# Patient Record
Sex: Male | Born: 1950 | Race: White | Hispanic: No | Marital: Married | Smoking: Former smoker
Health system: Southern US, Community
[De-identification: ages and names within clinical notes are randomized; demographics above are authoritative.]

## PROBLEM LIST (undated history)

## (undated) DIAGNOSIS — E785 Hyperlipidemia, unspecified: Secondary | ICD-10-CM

## (undated) DIAGNOSIS — Z923 Personal history of irradiation: Secondary | ICD-10-CM

## (undated) DIAGNOSIS — K2270 Barrett's esophagus without dysplasia: Secondary | ICD-10-CM

## (undated) DIAGNOSIS — H04123 Dry eye syndrome of bilateral lacrimal glands: Secondary | ICD-10-CM

## (undated) DIAGNOSIS — C8599 Non-Hodgkin lymphoma, unspecified, extranodal and solid organ sites: Secondary | ICD-10-CM

## (undated) DIAGNOSIS — I10 Essential (primary) hypertension: Secondary | ICD-10-CM

## (undated) DIAGNOSIS — N200 Calculus of kidney: Secondary | ICD-10-CM

## (undated) DIAGNOSIS — H3530 Unspecified macular degeneration: Secondary | ICD-10-CM

## (undated) DIAGNOSIS — Z86010 Personal history of colonic polyps: Secondary | ICD-10-CM

## (undated) DIAGNOSIS — K449 Diaphragmatic hernia without obstruction or gangrene: Secondary | ICD-10-CM

## (undated) DIAGNOSIS — Z87442 Personal history of urinary calculi: Secondary | ICD-10-CM

## (undated) DIAGNOSIS — G4733 Obstructive sleep apnea (adult) (pediatric): Secondary | ICD-10-CM

## (undated) DIAGNOSIS — K219 Gastro-esophageal reflux disease without esophagitis: Secondary | ICD-10-CM

## (undated) DIAGNOSIS — Z9989 Dependence on other enabling machines and devices: Secondary | ICD-10-CM

## (undated) HISTORY — PX: ESOPHAGOGASTRODUODENOSCOPY: SHX1529

## (undated) HISTORY — PX: TONSILLECTOMY: SUR1361

## (undated) HISTORY — PX: EXTRACORPOREAL SHOCK WAVE LITHOTRIPSY: SHX1557

## (undated) HISTORY — PX: HYDROCELE EXCISION: SHX482

## (undated) HISTORY — DX: Unspecified macular degeneration: H35.30

## (undated) HISTORY — DX: Essential (primary) hypertension: I10

## (undated) HISTORY — DX: Personal history of irradiation: Z92.3

---

## 2013-01-12 DIAGNOSIS — Z860100 Personal history of colon polyps, unspecified: Secondary | ICD-10-CM

## 2013-01-12 HISTORY — DX: Personal history of colon polyps, unspecified: Z86.0100

## 2013-01-12 HISTORY — DX: Personal history of colonic polyps: Z86.010

## 2014-01-12 HISTORY — PX: COLONOSCOPY WITH ESOPHAGOGASTRODUODENOSCOPY (EGD): SHX5779

## 2015-06-28 DIAGNOSIS — Z23 Encounter for immunization: Secondary | ICD-10-CM | POA: Diagnosis not present

## 2015-11-04 DIAGNOSIS — H353121 Nonexudative age-related macular degeneration, left eye, early dry stage: Secondary | ICD-10-CM | POA: Diagnosis not present

## 2015-11-04 DIAGNOSIS — H01021 Squamous blepharitis right upper eyelid: Secondary | ICD-10-CM | POA: Diagnosis not present

## 2015-11-04 DIAGNOSIS — H01024 Squamous blepharitis left upper eyelid: Secondary | ICD-10-CM | POA: Diagnosis not present

## 2015-11-04 DIAGNOSIS — H04123 Dry eye syndrome of bilateral lacrimal glands: Secondary | ICD-10-CM | POA: Diagnosis not present

## 2015-12-04 DIAGNOSIS — C8590 Non-Hodgkin lymphoma, unspecified, unspecified site: Secondary | ICD-10-CM | POA: Diagnosis not present

## 2015-12-04 DIAGNOSIS — Z1389 Encounter for screening for other disorder: Secondary | ICD-10-CM | POA: Diagnosis not present

## 2015-12-04 DIAGNOSIS — H04129 Dry eye syndrome of unspecified lacrimal gland: Secondary | ICD-10-CM | POA: Diagnosis not present

## 2015-12-04 DIAGNOSIS — M1990 Unspecified osteoarthritis, unspecified site: Secondary | ICD-10-CM | POA: Diagnosis not present

## 2015-12-04 DIAGNOSIS — K219 Gastro-esophageal reflux disease without esophagitis: Secondary | ICD-10-CM | POA: Diagnosis not present

## 2015-12-04 DIAGNOSIS — I10 Essential (primary) hypertension: Secondary | ICD-10-CM | POA: Diagnosis not present

## 2015-12-05 DIAGNOSIS — H01024 Squamous blepharitis left upper eyelid: Secondary | ICD-10-CM | POA: Diagnosis not present

## 2015-12-05 DIAGNOSIS — H353121 Nonexudative age-related macular degeneration, left eye, early dry stage: Secondary | ICD-10-CM | POA: Diagnosis not present

## 2015-12-05 DIAGNOSIS — H01021 Squamous blepharitis right upper eyelid: Secondary | ICD-10-CM | POA: Diagnosis not present

## 2015-12-25 ENCOUNTER — Inpatient Hospital Stay: Payer: Medicare Other | Admitting: Internal Medicine

## 2015-12-31 DIAGNOSIS — B360 Pityriasis versicolor: Secondary | ICD-10-CM | POA: Diagnosis not present

## 2015-12-31 DIAGNOSIS — L821 Other seborrheic keratosis: Secondary | ICD-10-CM | POA: Diagnosis not present

## 2015-12-31 DIAGNOSIS — L812 Freckles: Secondary | ICD-10-CM | POA: Diagnosis not present

## 2015-12-31 DIAGNOSIS — D485 Neoplasm of uncertain behavior of skin: Secondary | ICD-10-CM | POA: Diagnosis not present

## 2016-01-03 ENCOUNTER — Encounter: Payer: Self-pay | Admitting: *Deleted

## 2016-01-03 ENCOUNTER — Inpatient Hospital Stay: Payer: Medicare Other | Attending: Internal Medicine | Admitting: Internal Medicine

## 2016-01-03 ENCOUNTER — Inpatient Hospital Stay: Payer: Medicare Other

## 2016-01-03 VITALS — BP 142/82 | HR 78 | Temp 98.6°F | Resp 20 | Ht 74.34 in | Wt 252.4 lb

## 2016-01-03 DIAGNOSIS — Z7982 Long term (current) use of aspirin: Secondary | ICD-10-CM | POA: Diagnosis not present

## 2016-01-03 DIAGNOSIS — R2231 Localized swelling, mass and lump, right upper limb: Secondary | ICD-10-CM | POA: Insufficient documentation

## 2016-01-03 DIAGNOSIS — Z79899 Other long term (current) drug therapy: Secondary | ICD-10-CM | POA: Diagnosis not present

## 2016-01-03 DIAGNOSIS — Z923 Personal history of irradiation: Secondary | ICD-10-CM | POA: Insufficient documentation

## 2016-01-03 DIAGNOSIS — I10 Essential (primary) hypertension: Secondary | ICD-10-CM | POA: Diagnosis not present

## 2016-01-03 DIAGNOSIS — Z8572 Personal history of non-Hodgkin lymphomas: Secondary | ICD-10-CM | POA: Insufficient documentation

## 2016-01-03 DIAGNOSIS — H3530 Unspecified macular degeneration: Secondary | ICD-10-CM | POA: Diagnosis not present

## 2016-01-03 DIAGNOSIS — Z87891 Personal history of nicotine dependence: Secondary | ICD-10-CM | POA: Diagnosis not present

## 2016-01-03 DIAGNOSIS — C8580 Other specified types of non-Hodgkin lymphoma, unspecified site: Secondary | ICD-10-CM

## 2016-01-03 LAB — CBC WITH DIFFERENTIAL/PLATELET
Basophils Absolute: 0.2 10*3/uL — ABNORMAL HIGH (ref 0–0.1)
Basophils Relative: 4 %
Eosinophils Absolute: 0.2 10*3/uL (ref 0–0.7)
Eosinophils Relative: 4 %
HCT: 49.1 % (ref 40.0–52.0)
Hemoglobin: 16.5 g/dL (ref 13.0–18.0)
Lymphocytes Relative: 22 %
Lymphs Abs: 1.4 10*3/uL (ref 1.0–3.6)
MCH: 28.3 pg (ref 26.0–34.0)
MCHC: 33.5 g/dL (ref 32.0–36.0)
MCV: 84.3 fL (ref 80.0–100.0)
Monocytes Absolute: 0.5 10*3/uL (ref 0.2–1.0)
Monocytes Relative: 7 %
Neutro Abs: 4.1 10*3/uL (ref 1.4–6.5)
Neutrophils Relative %: 63 %
Platelets: 262 10*3/uL (ref 150–440)
RBC: 5.83 MIL/uL (ref 4.40–5.90)
RDW: 13 % (ref 11.5–14.5)
WBC: 6.5 10*3/uL (ref 3.8–10.6)

## 2016-01-03 LAB — COMPREHENSIVE METABOLIC PANEL
ALT: 38 U/L (ref 17–63)
AST: 35 U/L (ref 15–41)
Albumin: 4.5 g/dL (ref 3.5–5.0)
Alkaline Phosphatase: 102 U/L (ref 38–126)
Anion gap: 6 (ref 5–15)
BUN: 16 mg/dL (ref 6–20)
CO2: 29 mmol/L (ref 22–32)
Calcium: 9.8 mg/dL (ref 8.9–10.3)
Chloride: 101 mmol/L (ref 101–111)
Creatinine, Ser: 0.87 mg/dL (ref 0.61–1.24)
GFR calc Af Amer: >60 mL/min (ref >60)
GFR calc non Af Amer: >60 mL/min (ref >60)
Glucose, Bld: 118 mg/dL — ABNORMAL HIGH (ref 65–99)
Potassium: 3.9 mmol/L (ref 3.5–5.1)
Sodium: 136 mmol/L (ref 135–145)
Total Bilirubin: 0.7 mg/dL (ref 0.3–1.2)
Total Protein: 7.6 g/dL (ref 6.5–8.1)

## 2016-01-03 LAB — LACTATE DEHYDROGENASE: LDH: 158 U/L (ref 98–192)

## 2016-01-03 NOTE — Progress Notes (Signed)
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  male with a history of cutaneous lymphoma as listed above; is here to set up oncology appointment/ discuss the treatment options of his forearm lesion.  As summarized above patient had lymphoma of the face and right thigh status post radiation dating more than 10 years ago. He noted to have a lesion of his right forearm in 2012; that was subsequently biopsied in 2015 that was suggestive of a lymphoproliferative disorder.  Patient noted to have increasing size of the right forearm lesion. Otherwise denies any pain. Denies any bleeding. Appetite is good. No weight loss. Denies any night sweats. No fevers.   ROS: A complete 10 point review of system is done which is negative except mentioned above in history of present illness  MEDICAL HISTORY:  Past Medical History  Diagnosis Date  . 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 Hyperplastic colon polyp 01/12/2014  . Barrett esophagus   . Heartburn 01/11/2014  . Hydrocele   . Renal stone   . Hypertension   . Dry eye   . Macular degeneration   . History of radiation therapy     of lymphoma on skin: face and leg    SURGICAL HISTORY: Past Surgical History  Procedure Laterality Date  . Colonoscopy  01/12/2014  . Hydrocele excision    . Kidney stone surgery    . Esophagogastroduodenoscopy  01/12/2014    SOCIAL HISTORY: moved from [place].  Social History   Social History  . Marital Status: Married    Spouse Name: N/A  . Number of Children: N/A  . Years of Education: N/A   Occupational History  .  Not on file.   Social History Main Topics  . Smoking status: Former Smoker -- 2.00 packs/day for 22 years    Types: Cigarettes    Quit date: 08/22/1989  . Smokeless tobacco: Never Used     Comment: smoked x 22 yrs.  . Alcohol Use: 0.0 oz/week    0 Standard drinks or equivalent per week     Comment: glass of wine a day and martini on the weekend.  . Drug Use: No  . Sexual Activity: Not on file   Other Topics Concern  . Not on file   Social History Narrative  . No narrative on file    FAMILY HISTORY: Family History  Problem Relation Age of Onset  . Diabetes Father   . Diabetes Brother   . Heart murmur Mother   . Kidney disease Mother   . Aneurysm Mother     ALLERGIES:  has No Known Allergies.  MEDICATIONS:  Current Outpatient Prescriptions  Medication Sig Dispense Refill  . aspirin 81 MG chewable tablet Chew 81 mg by mouth daily.    . ciclopirox (LOPROX) 0.77 % cream Apply 1 application topically once a week.    . Eyelid Cleansers (AVENOVA) 0.01 % SOLN Apply 1 Dose topically 2 (two) times daily.    . Fish Oil-Cholecalciferol (OMEGA-3 + VITAMIN D3 PO) Take 4 capsules by mouth daily.    . Liniments (BLUE-EMU SUPER STRENGTH) CREA  Apply 1 application topically once.    . Multiple Vitamins-Minerals (PRESERVISION/LUTEIN PO) Take 2 capsules by mouth daily.    . naproxen sodium (ANAPROX) 220 MG tablet Take 220 mg by mouth 2 (two) times daily as needed (pain). Reported on 01/03/2016    . omeprazole (PRILOSEC) 20 MG capsule Take 20 mg by mouth daily.    Vladimir Faster Glycol-Propyl Glycol (SYSTANE OP) Apply 1 drop to eye 3 (three) times daily. Each eye    . selenium sulfide (SELSUN) 2.5 % shampoo Apply 1 application topically daily as needed for irritation.    . valsartan-hydrochlorothiazide (DIOVAN-HCT) 160-12.5 MG tablet Take 1 tablet by mouth daily.     No current facility-administered medications for this visit.      Marland Kitchen  PHYSICAL EXAMINATION: ECOG PERFORMANCE STATUS: 0 -  Asymptomatic  Filed Vitals:   01/03/16 1527 01/03/16 1536  BP:  142/82  Pulse:  78  Temp:  98.6 F (37 C)  Resp: 20 20   Filed Weights   01/03/16 1527  Weight: 252 lb 6.8 oz (114.5 kg)    GENERAL: Well-nourished well-developed; Alert, no distress and comfortable.  Accompanied by his wife.  EYES: no pallor or icterus OROPHARYNX: no thrush or ulceration; good dentition  NECK: supple, no masses felt LYMPH:  no palpable lymphadenopathy in the cervical, axillary or inguinal regions LUNGS: clear to auscultation and  No wheeze or crackles HEART/CVS: regular rate & rhythm and no murmurs; No lower extremity edema ABDOMEN: abdomen soft, non-tender and normal bowel sounds Musculoskeletal:no cyanosis of digits and no clubbing  PSYCH: alert & oriented x 3 with fluent speech NEURO: no focal motor/sensory deficits SKIN: 1 x 1.5 cm nodular lesion noted on the right distal forearm.   LABORATORY DATA:  I have reviewed the data as listed Lab Results  Component Value Date   WBC 6.5 01/03/2016   HGB 16.5 01/03/2016   HCT 49.1 01/03/2016   MCV 84.3 01/03/2016   PLT 262 01/03/2016    Recent Labs  01/03/16 1516  NA 136  K 3.9  CL 101  CO2 29  GLUCOSE 118*  BUN 16  CREATININE 0.87  CALCIUM 9.8  GFRNONAA >60  GFRAA >60  PROT 7.6  ALBUMIN 4.5  AST 35  ALT 38  ALKPHOS 102  BILITOT 0.7     ASSESSMENT & PLAN:   # Right forearm nodular lesion- 1x1.5cm- status post previous biopsy 2015 suggestive of lymphoproliferative disorder. I would recommend repeat biopsy for further delineation of the pathology. If it is again confirmed as lymphoma; as this appears to be recurrent cutaneous; # Labs from today are within normal limits- I would not recommend any staging workup at this time. I discussed at length the natural history of cutaneous lymphomas/and the treatment options. As the right arm lesion seems to be growing- I would recommend radiation.   # Patient's wife will call dermatology  for setting up in a biopsy. They'll follow-up with me/Dr. Donella Stade radiation oncology around the same day/approximately 2 weeks after the skin biopsy.  Thank you for allowing me to participate in the care of your pleasant patient. Please do not hesitate to contact me with questions or concerns in the interim.   # 45 minutes face-to-face with the patient discussing the above plan of care; more than 50% of time spent on prognosis/ natural history; counseling and coordination.     Cammie Sickle, MD 01/03/2016 4:06 PM

## 2016-01-06 DIAGNOSIS — C884 Extranodal marginal zone B-cell lymphoma of mucosa-associated lymphoid tissue [MALT-lymphoma]: Secondary | ICD-10-CM | POA: Diagnosis not present

## 2016-01-06 DIAGNOSIS — D485 Neoplasm of uncertain behavior of skin: Secondary | ICD-10-CM | POA: Diagnosis not present

## 2016-01-29 ENCOUNTER — Encounter: Payer: Self-pay | Admitting: Radiation Oncology

## 2016-01-29 ENCOUNTER — Ambulatory Visit
Admission: RE | Admit: 2016-01-29 | Discharge: 2016-01-29 | Disposition: A | Discharge status: Home / Self Care | Payer: Medicare Other | Source: Ambulatory Visit | Attending: Radiation Oncology | Admitting: Radiation Oncology

## 2016-01-29 ENCOUNTER — Inpatient Hospital Stay: Payer: Medicare Other | Attending: Internal Medicine | Admitting: Internal Medicine

## 2016-01-29 VITALS — BP 126/80 | HR 54 | Temp 96.4°F | Resp 16 | Wt 254.4 lb

## 2016-01-29 DIAGNOSIS — C8514 Unspecified B-cell lymphoma, lymph nodes of axilla and upper limb: Secondary | ICD-10-CM | POA: Insufficient documentation

## 2016-01-29 DIAGNOSIS — C884 Extranodal marginal zone B-cell lymphoma of mucosa-associated lymphoid tissue [MALT-lymphoma]: Secondary | ICD-10-CM

## 2016-01-29 DIAGNOSIS — C8580 Other specified types of non-Hodgkin lymphoma, unspecified site: Secondary | ICD-10-CM

## 2016-01-29 DIAGNOSIS — Z8572 Personal history of non-Hodgkin lymphomas: Secondary | ICD-10-CM | POA: Diagnosis not present

## 2016-01-29 DIAGNOSIS — I10 Essential (primary) hypertension: Secondary | ICD-10-CM | POA: Insufficient documentation

## 2016-01-29 DIAGNOSIS — H3530 Unspecified macular degeneration: Secondary | ICD-10-CM | POA: Diagnosis not present

## 2016-01-29 DIAGNOSIS — Z79899 Other long term (current) drug therapy: Secondary | ICD-10-CM | POA: Diagnosis not present

## 2016-01-29 DIAGNOSIS — R12 Heartburn: Secondary | ICD-10-CM | POA: Insufficient documentation

## 2016-01-29 DIAGNOSIS — Z87891 Personal history of nicotine dependence: Secondary | ICD-10-CM | POA: Insufficient documentation

## 2016-01-29 DIAGNOSIS — Z86010 Personal history of colonic polyps: Secondary | ICD-10-CM | POA: Insufficient documentation

## 2016-01-29 DIAGNOSIS — Z7982 Long term (current) use of aspirin: Secondary | ICD-10-CM | POA: Diagnosis not present

## 2016-01-29 DIAGNOSIS — K2270 Barrett's esophagus without dysplasia: Secondary | ICD-10-CM | POA: Insufficient documentation

## 2016-01-29 DIAGNOSIS — Z923 Personal history of irradiation: Secondary | ICD-10-CM | POA: Insufficient documentation

## 2016-01-29 DIAGNOSIS — Z510 Encounter for antineoplastic radiation therapy: Secondary | ICD-10-CM | POA: Insufficient documentation

## 2016-01-29 DIAGNOSIS — C8264 Cutaneous follicle center lymphoma, lymph nodes of axilla and upper limb: Secondary | ICD-10-CM | POA: Insufficient documentation

## 2016-01-29 DIAGNOSIS — Z87442 Personal history of urinary calculi: Secondary | ICD-10-CM | POA: Insufficient documentation

## 2016-01-29 NOTE — Progress Notes (Signed)
Patient has been evaluated and had a biopsy by dermatology, Dr. Allyson Sabal.

## 2016-01-29 NOTE — Consult Note (Signed)
Except an outstanding is perfect of Radiation Oncology NEW PATIENT EVALUATION  Name: Michael Huffman  MRN: [id number]  Date:   01/29/2016     DOB: 12/15/1950   This 65 y.o. 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  REFERRING PHYSICIAN: Wenda Low, MD  CHIEF COMPLAINT: No chief complaint on file.   DIAGNOSIS: The encounter diagnosis was Other specified type of non-Hodgkin lymphoma ([place]).   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 Recently presented with a right arm nodular density initial biopsy was positive for lymphocytic infiltrate back in May 2015. He had a shave biopsy showing cutaneous follicular B-cell lymphoma according to oral report from medical oncology. I've requested those reports for my records. He is otherwise doing well he specifically denies fever chills night sweats or weight loss. I've asked to evaluate the patient for radiation therapy to this area.  PLANNED TREATMENT REGIMEN: Electron beam therapy  PAST MEDICAL HISTORY:  has a past medical history of Non Hodgkin's lymphoma ([place]) (2006); Hyperplastic colon polyp (01/12/2014); Barrett esophagus; Heartburn (01/11/2014); Hydrocele; Renal stone; Hypertension; Dry eye; Macular degeneration; and History of radiation therapy.    PAST SURGICAL HISTORY:  Past Surgical History  Procedure Laterality Date  . Colonoscopy  01/12/2014  . Hydrocele excision    . Kidney stone surgery    . Esophagogastroduodenoscopy  01/12/2014    FAMILY HISTORY: family history includes Aneurysm in his mother; Diabetes in his brother and father; Heart murmur in his mother; Kidney disease in his  mother.  SOCIAL HISTORY:  reports that he quit smoking about 26 years ago. His smoking use included Cigarettes. He has a 44 pack-year smoking history. He has never used smokeless tobacco. He reports that he drinks alcohol. He reports that he does not use illicit drugs.  ALLERGIES: Review of patient's allergies indicates no known allergies.  MEDICATIONS:  Current Outpatient Prescriptions  Medication Sig Dispense Refill  . aspirin 81 MG chewable tablet Chew 81 mg by mouth daily.    . ciclopirox (LOPROX) 0.77 % cream Apply 1 application topically once a week.    . Eyelid Cleansers (AVENOVA) 0.01 % SOLN Apply 1 Dose topically 2 (two) times daily.    . Fish Oil-Cholecalciferol (OMEGA-3 + VITAMIN D3 PO) Take 4 capsules by mouth daily.    . Liniments (BLUE-EMU SUPER STRENGTH) CREA Apply 1 application topically once.    . Multiple Vitamins-Minerals (PRESERVISION/LUTEIN PO) Take 2 capsules by mouth daily.    . naproxen sodium (ANAPROX) 220 MG tablet Take 220 mg by mouth 2 (two) times daily as needed (pain). Reported on 01/03/2016    . omeprazole (PRILOSEC) 20 MG capsule Take 20 mg by mouth daily.    Vladimir Faster Glycol-Propyl Glycol (SYSTANE OP) Apply 1 drop to eye 3 (three) times daily. Each eye    . selenium sulfide (SELSUN) 2.5 % shampoo Apply 1 application topically daily as needed for irritation.    . valsartan-hydrochlorothiazide (DIOVAN-HCT) 160-12.5 MG tablet Take 1 tablet by mouth daily.     No current facility-administered medications for this encounter.    ECOG PERFORMANCE STATUS:  0 - Asymptomatic  REVIEW OF SYSTEMS:  Patient denies any weight loss, fatigue, weakness, fever,  chills or night sweats. Patient denies any loss of vision, blurred vision. Patient denies any ringing  of the ears or hearing loss. No irregular heartbeat. Patient denies heart murmur or history of fainting. Patient denies any chest pain or pain radiating to her upper extremities. Patient denies any shortness of  breath, difficulty breathing at night, cough or hemoptysis. Patient denies any swelling in the lower legs. Patient denies any nausea vomiting, vomiting of blood, or coffee ground material in the vomitus. Patient denies any stomach pain. Patient states has had normal bowel movements no significant constipation or diarrhea. Patient denies any dysuria, hematuria or significant nocturia. Patient denies any problems walking, swelling in the joints or loss of balance. Patient denies any skin changes, loss of hair or loss of weight. Patient denies any excessive worrying or anxiety or significant depression. Patient denies any problems with insomnia. Patient denies excessive thirst, polyuria, polydipsia. Patient denies any swollen glands, patient denies easy bruising or easy bleeding. Patient denies any recent infections, allergies or URI. Patient "s visual fields have not changed significantly in recent time.    PHYSICAL EXAM: There were no vitals taken for this visit. Approximate 1 cm raised erythematous nodule on the right forearm consistent with known diagnosis. No evidence of axillary supra clavicular adenopathy on the right side is noted. No other evidence of adenopathy is appreciated. Well-developed well-nourished patient in NAD. HEENT reveals PERLA, EOMI, discs not visualized.  Oral cavity is clear. No oral mucosal lesions are identified. Neck is clear without evidence of cervical or supraclavicular adenopathy. Lungs are clear to A&P. Cardiac examination is essentially unremarkable with regular rate and rhythm without murmur rub or thrill. Abdomen is benign with no organomegaly or masses noted. Motor sensory and DTR levels are equal and symmetric in the upper and lower extremities. Cranial nerves II through XII are grossly intact. Proprioception is intact. No peripheral adenopathy or edema is identified. No motor or sensory levels are noted. Crude visual fields are within normal range.  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 Would also plan on delivering 4000 centigray over 4 weeks using electron beam to this area. The standard dose in the literature runs between 3000 and 4000 cGy. Risks and benefits of treatment including skin reaction loss of hair on the part of his arm possible slight fatigue all were discussed in detail with the patient and his wife. Both seem to comprehend my treatment plan well. I firstly ordered CT simulation on the patient for depth dose determination.  I would like to take this opportunity for allowing me to participate in the care of your patient.Armstead Peaks., MD

## 2016-01-29 NOTE — Progress Notes (Signed)
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 [Dr.Lupton; [place]] RIGHT FOREARM s/p punch Biopsy; Recm RT  HISTORY OF PRESENTING ILLNESS:  Michael Huffman 65 y.o.  male with a history of cutaneous lymphoma as listed above- had a recent nodular lesion taken off his right forearm at [facility] later part of April 2017. As per the patient it was a cutaneous B-cell lymphoma.  He denies any new lesions. Otherwise denies any pain. Denies any bleeding. Appetite is good. No weight loss. Denies any night sweats. No fevers.   ROS: A complete 10 point review of system is done which is negative except mentioned above in history of present illness  MEDICAL HISTORY:  Past Medical History  Diagnosis Date  . 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 Hyperplastic colon polyp 01/12/2014  . Barrett esophagus   . Heartburn 01/11/2014  . Hydrocele   . Renal stone   . Hypertension   . Dry eye   . Macular degeneration   . History of radiation therapy     of lymphoma on skin: face and leg    SURGICAL HISTORY: Past Surgical History  Procedure Laterality Date  . Colonoscopy  01/12/2014  . Hydrocele excision    . Kidney stone surgery    . Esophagogastroduodenoscopy  01/12/2014    SOCIAL HISTORY: moved from [place].  Social History   Social History  . Marital Status: Married    Spouse Name: N/A  . Number of Children: N/A  . Years of Education: N/A   Occupational History  . Not on file.   Social History Main Topics  . Smoking status: Former Smoker --  2.00 packs/day for 22 years    Types: Cigarettes    Quit date: 08/22/1989  . Smokeless tobacco: Never Used     Comment: smoked x 22 yrs.  . Alcohol Use: 0.0 oz/week    0 Standard drinks or equivalent per week     Comment: glass of wine a day and martini on the weekend.  . Drug Use: No  . Sexual Activity: Not on file   Other Topics Concern  . Not on file   Social History Narrative  . No narrative on file    FAMILY HISTORY: Family History  Problem Relation Age of Onset  . Diabetes Father   . Diabetes Brother   . Heart murmur Mother   . Kidney disease Mother   . Aneurysm Mother     ALLERGIES:  has No Known Allergies.  MEDICATIONS:  Current Outpatient Prescriptions  Medication Sig Dispense Refill  . aspirin 81 MG chewable tablet Chew 81 mg by mouth daily.    . ciclopirox (LOPROX) 0.77 % cream Apply 1 application topically once a week.    . Eyelid Cleansers (AVENOVA) 0.01 % SOLN Apply 1 Dose topically 2 (two) times daily.    . Fish Oil-Cholecalciferol (OMEGA-3 + VITAMIN D3 PO) Take 4 capsules by mouth daily.    . Liniments (BLUE-EMU SUPER STRENGTH) CREA Apply 1 application topically once.    . Multiple Vitamins-Minerals (PRESERVISION/LUTEIN PO) Take 2  capsules by mouth daily.    . naproxen sodium (ANAPROX) 220 MG tablet Take 220 mg by mouth 2 (two) times daily as needed (pain). Reported on 01/03/2016    . omeprazole (PRILOSEC) 20 MG capsule Take 20 mg by mouth daily.    Michael Huffman Glycol-Propyl Glycol (SYSTANE OP) Apply 1 drop to eye 3 (three) times daily. Each eye    . selenium sulfide (SELSUN) 2.5 % shampoo Apply 1 application topically daily as needed for irritation.    . valsartan-hydrochlorothiazide (DIOVAN-HCT) 160-12.5 MG tablet Take 1 tablet by mouth daily.     No current facility-administered medications for this visit.      Marland Kitchen  PHYSICAL EXAMINATION: ECOG PERFORMANCE STATUS: 0 - Asymptomatic  Filed Vitals:   01/29/16 0952  BP: 126/80  Pulse: 54  Temp: 96.4  F (35.8 C)  Resp: 16   Filed Weights   01/29/16 0952  Weight: 254 lb 6.6 oz (115.4 kg)    GENERAL: Well-nourished well-developed; Alert, no distress and comfortable.  Accompanied by his wife.  EYES: no pallor or icterus OROPHARYNX: no thrush or ulceration; good dentition  NECK: supple, no masses felt LYMPH:  no palpable lymphadenopathy in the cervical, axillary or inguinal regions LUNGS: clear to auscultation and  No wheeze or crackles HEART/CVS: regular rate & rhythm and no murmurs; No lower extremity edema ABDOMEN: abdomen soft, non-tender and normal bowel sounds Musculoskeletal:no cyanosis of digits and no clubbing  PSYCH: alert & oriented x 3 with fluent speech NEURO: no focal motor/sensory deficits SKIN: 0.5x0.5cm nodular lesion noted on the right distal forearm [site of excision]  LABORATORY DATA:  I have reviewed the data as listed Lab Results  Component Value Date   WBC 6.5 01/03/2016   HGB 16.5 01/03/2016   HCT 49.1 01/03/2016   MCV 84.3 01/03/2016   PLT 262 01/03/2016    Recent Labs  01/03/16 1516  NA 136  K 3.9  CL 101  CO2 29  GLUCOSE 118*  BUN 16  CREATININE 0.87  CALCIUM 9.8  GFRNONAA >60  GFRAA >60  PROT 7.6  ALBUMIN 4.5  AST 35  ALT 38  ALKPHOS 102  BILITOT 0.7     ASSESSMENT & PLAN:   # Right forearm nodular lesion-Status post punch Biopsy- late April 2017 [Dr.lupton; [place]]- CUTANEOUS MARGINAL ZONE B CELL LYMPHOMA. Again reviewed with the patient at length that this is usually cutaneous recurrence; and not a systemic recurrence. He will inform us if he has any unusual weight loss or night sweats or new lumps or bumps or fevers.  # Patient will benefit from radiation post excision of the right forearm lesion- as it has been recently growing. Spoke to Dr. Donella Stade. Patient sees Dr. Donella Stade later this morning.  # Patient follow-up with me in approximately 4 months no labs. Above plan discussed with the patient and wife. They'll call us sooner  if needed.   Cammie Sickle, MD 01/29/2016 10:14 AM

## 2016-01-30 ENCOUNTER — Ambulatory Visit
Admission: RE | Admit: 2016-01-30 | Discharge: 2016-01-30 | Disposition: A | Discharge status: Home / Self Care | Payer: Medicare Other | Source: Ambulatory Visit | Attending: Radiation Oncology | Admitting: Radiation Oncology

## 2016-01-30 DIAGNOSIS — Z7982 Long term (current) use of aspirin: Secondary | ICD-10-CM | POA: Diagnosis not present

## 2016-01-30 DIAGNOSIS — I10 Essential (primary) hypertension: Secondary | ICD-10-CM | POA: Diagnosis not present

## 2016-01-30 DIAGNOSIS — Z87891 Personal history of nicotine dependence: Secondary | ICD-10-CM | POA: Diagnosis not present

## 2016-01-30 DIAGNOSIS — C8580 Other specified types of non-Hodgkin lymphoma, unspecified site: Secondary | ICD-10-CM | POA: Diagnosis not present

## 2016-01-30 DIAGNOSIS — Z923 Personal history of irradiation: Secondary | ICD-10-CM | POA: Diagnosis not present

## 2016-01-30 DIAGNOSIS — K2270 Barrett's esophagus without dysplasia: Secondary | ICD-10-CM | POA: Diagnosis not present

## 2016-01-30 DIAGNOSIS — Z510 Encounter for antineoplastic radiation therapy: Secondary | ICD-10-CM | POA: Diagnosis not present

## 2016-01-30 DIAGNOSIS — Z87442 Personal history of urinary calculi: Secondary | ICD-10-CM | POA: Diagnosis not present

## 2016-01-30 DIAGNOSIS — R12 Heartburn: Secondary | ICD-10-CM | POA: Diagnosis not present

## 2016-01-30 DIAGNOSIS — Z86010 Personal history of colonic polyps: Secondary | ICD-10-CM | POA: Diagnosis not present

## 2016-01-30 DIAGNOSIS — C8264 Cutaneous follicle center lymphoma, lymph nodes of axilla and upper limb: Secondary | ICD-10-CM | POA: Diagnosis not present

## 2016-01-30 DIAGNOSIS — Z79899 Other long term (current) drug therapy: Secondary | ICD-10-CM | POA: Diagnosis not present

## 2016-02-03 DIAGNOSIS — I10 Essential (primary) hypertension: Secondary | ICD-10-CM | POA: Diagnosis not present

## 2016-02-03 DIAGNOSIS — Z86010 Personal history of colonic polyps: Secondary | ICD-10-CM | POA: Diagnosis not present

## 2016-02-03 DIAGNOSIS — C8264 Cutaneous follicle center lymphoma, lymph nodes of axilla and upper limb: Secondary | ICD-10-CM | POA: Diagnosis not present

## 2016-02-03 DIAGNOSIS — C8580 Other specified types of non-Hodgkin lymphoma, unspecified site: Secondary | ICD-10-CM | POA: Diagnosis not present

## 2016-02-03 DIAGNOSIS — Z510 Encounter for antineoplastic radiation therapy: Secondary | ICD-10-CM | POA: Diagnosis not present

## 2016-02-03 DIAGNOSIS — K2270 Barrett's esophagus without dysplasia: Secondary | ICD-10-CM | POA: Diagnosis not present

## 2016-02-03 DIAGNOSIS — R12 Heartburn: Secondary | ICD-10-CM | POA: Diagnosis not present

## 2016-02-06 ENCOUNTER — Other Ambulatory Visit: Payer: Self-pay | Admitting: Internal Medicine

## 2016-02-06 ENCOUNTER — Ambulatory Visit
Admission: RE | Admit: 2016-02-06 | Discharge: 2016-02-06 | Disposition: A | Discharge status: Home / Self Care | Payer: Medicare Other | Source: Ambulatory Visit | Attending: Radiation Oncology | Admitting: Radiation Oncology

## 2016-02-06 DIAGNOSIS — Z136 Encounter for screening for cardiovascular disorders: Secondary | ICD-10-CM

## 2016-02-06 DIAGNOSIS — C8264 Cutaneous follicle center lymphoma, lymph nodes of axilla and upper limb: Secondary | ICD-10-CM | POA: Diagnosis not present

## 2016-02-06 DIAGNOSIS — Z23 Encounter for immunization: Secondary | ICD-10-CM | POA: Diagnosis not present

## 2016-02-06 DIAGNOSIS — H04129 Dry eye syndrome of unspecified lacrimal gland: Secondary | ICD-10-CM | POA: Diagnosis not present

## 2016-02-06 DIAGNOSIS — Z0000 Encounter for general adult medical examination without abnormal findings: Secondary | ICD-10-CM | POA: Diagnosis not present

## 2016-02-06 DIAGNOSIS — Z1389 Encounter for screening for other disorder: Secondary | ICD-10-CM | POA: Diagnosis not present

## 2016-02-06 DIAGNOSIS — Z86010 Personal history of colonic polyps: Secondary | ICD-10-CM | POA: Diagnosis not present

## 2016-02-06 DIAGNOSIS — M1990 Unspecified osteoarthritis, unspecified site: Secondary | ICD-10-CM | POA: Diagnosis not present

## 2016-02-06 DIAGNOSIS — R0683 Snoring: Secondary | ICD-10-CM | POA: Diagnosis not present

## 2016-02-06 DIAGNOSIS — Z510 Encounter for antineoplastic radiation therapy: Secondary | ICD-10-CM | POA: Diagnosis not present

## 2016-02-06 DIAGNOSIS — R7309 Other abnormal glucose: Secondary | ICD-10-CM | POA: Diagnosis not present

## 2016-02-06 DIAGNOSIS — R0681 Apnea, not elsewhere classified: Secondary | ICD-10-CM | POA: Diagnosis not present

## 2016-02-06 DIAGNOSIS — G4730 Sleep apnea, unspecified: Secondary | ICD-10-CM | POA: Diagnosis not present

## 2016-02-06 DIAGNOSIS — I10 Essential (primary) hypertension: Secondary | ICD-10-CM | POA: Diagnosis not present

## 2016-02-06 DIAGNOSIS — K2270 Barrett's esophagus without dysplasia: Secondary | ICD-10-CM | POA: Diagnosis not present

## 2016-02-06 DIAGNOSIS — C8590 Non-Hodgkin lymphoma, unspecified, unspecified site: Secondary | ICD-10-CM | POA: Diagnosis not present

## 2016-02-06 DIAGNOSIS — R12 Heartburn: Secondary | ICD-10-CM | POA: Diagnosis not present

## 2016-02-06 DIAGNOSIS — Z6832 Body mass index (BMI) 32.0-32.9, adult: Secondary | ICD-10-CM | POA: Diagnosis not present

## 2016-02-06 DIAGNOSIS — K219 Gastro-esophageal reflux disease without esophagitis: Secondary | ICD-10-CM | POA: Diagnosis not present

## 2016-02-06 DIAGNOSIS — Z125 Encounter for screening for malignant neoplasm of prostate: Secondary | ICD-10-CM | POA: Diagnosis not present

## 2016-02-06 DIAGNOSIS — R739 Hyperglycemia, unspecified: Secondary | ICD-10-CM | POA: Diagnosis not present

## 2016-02-07 ENCOUNTER — Ambulatory Visit
Admission: RE | Admit: 2016-02-07 | Discharge: 2016-02-07 | Disposition: A | Discharge status: Home / Self Care | Payer: Medicare Other | Source: Ambulatory Visit | Attending: Radiation Oncology | Admitting: Radiation Oncology

## 2016-02-07 DIAGNOSIS — Z510 Encounter for antineoplastic radiation therapy: Secondary | ICD-10-CM | POA: Diagnosis not present

## 2016-02-07 DIAGNOSIS — R12 Heartburn: Secondary | ICD-10-CM | POA: Diagnosis not present

## 2016-02-07 DIAGNOSIS — K2270 Barrett's esophagus without dysplasia: Secondary | ICD-10-CM | POA: Diagnosis not present

## 2016-02-07 DIAGNOSIS — Z86010 Personal history of colonic polyps: Secondary | ICD-10-CM | POA: Diagnosis not present

## 2016-02-07 DIAGNOSIS — I10 Essential (primary) hypertension: Secondary | ICD-10-CM | POA: Diagnosis not present

## 2016-02-07 DIAGNOSIS — C8264 Cutaneous follicle center lymphoma, lymph nodes of axilla and upper limb: Secondary | ICD-10-CM | POA: Diagnosis not present

## 2016-02-10 ENCOUNTER — Ambulatory Visit
Admission: RE | Admit: 2016-02-10 | Discharge: 2016-02-10 | Disposition: A | Discharge status: Home / Self Care | Payer: Medicare Other | Source: Ambulatory Visit | Attending: Radiation Oncology | Admitting: Radiation Oncology

## 2016-02-10 DIAGNOSIS — Z86010 Personal history of colonic polyps: Secondary | ICD-10-CM | POA: Diagnosis not present

## 2016-02-10 DIAGNOSIS — I10 Essential (primary) hypertension: Secondary | ICD-10-CM | POA: Diagnosis not present

## 2016-02-10 DIAGNOSIS — R12 Heartburn: Secondary | ICD-10-CM | POA: Diagnosis not present

## 2016-02-10 DIAGNOSIS — Z510 Encounter for antineoplastic radiation therapy: Secondary | ICD-10-CM | POA: Diagnosis not present

## 2016-02-10 DIAGNOSIS — K2270 Barrett's esophagus without dysplasia: Secondary | ICD-10-CM | POA: Diagnosis not present

## 2016-02-10 DIAGNOSIS — C8264 Cutaneous follicle center lymphoma, lymph nodes of axilla and upper limb: Secondary | ICD-10-CM | POA: Diagnosis not present

## 2016-02-11 ENCOUNTER — Ambulatory Visit
Admission: RE | Admit: 2016-02-11 | Discharge: 2016-02-11 | Disposition: A | Discharge status: Home / Self Care | Payer: Medicare Other | Source: Ambulatory Visit | Attending: Radiation Oncology | Admitting: Radiation Oncology

## 2016-02-11 ENCOUNTER — Other Ambulatory Visit: Payer: Self-pay | Admitting: *Deleted

## 2016-02-11 DIAGNOSIS — C884 Extranodal marginal zone B-cell lymphoma of mucosa-associated lymphoid tissue [MALT-lymphoma]: Secondary | ICD-10-CM

## 2016-02-11 DIAGNOSIS — C8264 Cutaneous follicle center lymphoma, lymph nodes of axilla and upper limb: Secondary | ICD-10-CM | POA: Diagnosis not present

## 2016-02-11 DIAGNOSIS — K2270 Barrett's esophagus without dysplasia: Secondary | ICD-10-CM | POA: Diagnosis not present

## 2016-02-11 DIAGNOSIS — Z510 Encounter for antineoplastic radiation therapy: Secondary | ICD-10-CM | POA: Diagnosis not present

## 2016-02-11 DIAGNOSIS — Z86010 Personal history of colonic polyps: Secondary | ICD-10-CM | POA: Diagnosis not present

## 2016-02-11 DIAGNOSIS — R12 Heartburn: Secondary | ICD-10-CM | POA: Diagnosis not present

## 2016-02-11 DIAGNOSIS — I10 Essential (primary) hypertension: Secondary | ICD-10-CM | POA: Diagnosis not present

## 2016-02-12 ENCOUNTER — Ambulatory Visit
Admission: RE | Admit: 2016-02-12 | Discharge: 2016-02-12 | Disposition: A | Discharge status: Home / Self Care | Payer: Medicare Other | Source: Ambulatory Visit | Attending: Radiation Oncology | Admitting: Radiation Oncology

## 2016-02-12 DIAGNOSIS — K2270 Barrett's esophagus without dysplasia: Secondary | ICD-10-CM | POA: Diagnosis not present

## 2016-02-12 DIAGNOSIS — I10 Essential (primary) hypertension: Secondary | ICD-10-CM | POA: Diagnosis not present

## 2016-02-12 DIAGNOSIS — C8580 Other specified types of non-Hodgkin lymphoma, unspecified site: Secondary | ICD-10-CM | POA: Diagnosis not present

## 2016-02-12 DIAGNOSIS — R12 Heartburn: Secondary | ICD-10-CM | POA: Diagnosis not present

## 2016-02-12 DIAGNOSIS — Z510 Encounter for antineoplastic radiation therapy: Secondary | ICD-10-CM | POA: Diagnosis not present

## 2016-02-12 DIAGNOSIS — Z86010 Personal history of colonic polyps: Secondary | ICD-10-CM | POA: Diagnosis not present

## 2016-02-12 DIAGNOSIS — C8264 Cutaneous follicle center lymphoma, lymph nodes of axilla and upper limb: Secondary | ICD-10-CM | POA: Diagnosis not present

## 2016-02-13 ENCOUNTER — Ambulatory Visit
Admission: RE | Admit: 2016-02-13 | Discharge: 2016-02-13 | Disposition: A | Discharge status: Home / Self Care | Payer: Medicare Other | Source: Ambulatory Visit | Attending: Radiation Oncology | Admitting: Radiation Oncology

## 2016-02-13 DIAGNOSIS — Z510 Encounter for antineoplastic radiation therapy: Secondary | ICD-10-CM | POA: Diagnosis not present

## 2016-02-13 DIAGNOSIS — R12 Heartburn: Secondary | ICD-10-CM | POA: Diagnosis not present

## 2016-02-13 DIAGNOSIS — Z86010 Personal history of colonic polyps: Secondary | ICD-10-CM | POA: Diagnosis not present

## 2016-02-13 DIAGNOSIS — K2270 Barrett's esophagus without dysplasia: Secondary | ICD-10-CM | POA: Diagnosis not present

## 2016-02-13 DIAGNOSIS — I10 Essential (primary) hypertension: Secondary | ICD-10-CM | POA: Diagnosis not present

## 2016-02-13 DIAGNOSIS — C8264 Cutaneous follicle center lymphoma, lymph nodes of axilla and upper limb: Secondary | ICD-10-CM | POA: Diagnosis not present

## 2016-02-14 ENCOUNTER — Ambulatory Visit
Admission: RE | Admit: 2016-02-14 | Discharge: 2016-02-14 | Disposition: A | Discharge status: Home / Self Care | Payer: Medicare Other | Source: Ambulatory Visit | Attending: Radiation Oncology | Admitting: Radiation Oncology

## 2016-02-14 DIAGNOSIS — K2270 Barrett's esophagus without dysplasia: Secondary | ICD-10-CM | POA: Diagnosis not present

## 2016-02-14 DIAGNOSIS — Z510 Encounter for antineoplastic radiation therapy: Secondary | ICD-10-CM | POA: Diagnosis not present

## 2016-02-14 DIAGNOSIS — Z86010 Personal history of colonic polyps: Secondary | ICD-10-CM | POA: Diagnosis not present

## 2016-02-14 DIAGNOSIS — I10 Essential (primary) hypertension: Secondary | ICD-10-CM | POA: Diagnosis not present

## 2016-02-14 DIAGNOSIS — C8264 Cutaneous follicle center lymphoma, lymph nodes of axilla and upper limb: Secondary | ICD-10-CM | POA: Diagnosis not present

## 2016-02-14 DIAGNOSIS — R12 Heartburn: Secondary | ICD-10-CM | POA: Diagnosis not present

## 2016-02-18 ENCOUNTER — Ambulatory Visit
Admission: RE | Admit: 2016-02-18 | Discharge: 2016-02-18 | Disposition: A | Discharge status: Home / Self Care | Payer: Medicare Other | Source: Ambulatory Visit | Attending: Radiation Oncology | Admitting: Radiation Oncology

## 2016-02-18 ENCOUNTER — Ambulatory Visit
Admission: RE | Admit: 2016-02-18 | Discharge: 2016-02-18 | Disposition: A | Discharge status: Home / Self Care | Payer: Medicare Other | Source: Ambulatory Visit | Attending: Internal Medicine | Admitting: Internal Medicine

## 2016-02-18 DIAGNOSIS — Z136 Encounter for screening for cardiovascular disorders: Secondary | ICD-10-CM | POA: Diagnosis not present

## 2016-02-18 DIAGNOSIS — K2270 Barrett's esophagus without dysplasia: Secondary | ICD-10-CM | POA: Diagnosis not present

## 2016-02-18 DIAGNOSIS — Z86010 Personal history of colonic polyps: Secondary | ICD-10-CM | POA: Diagnosis not present

## 2016-02-18 DIAGNOSIS — I10 Essential (primary) hypertension: Secondary | ICD-10-CM | POA: Diagnosis not present

## 2016-02-18 DIAGNOSIS — R12 Heartburn: Secondary | ICD-10-CM | POA: Diagnosis not present

## 2016-02-18 DIAGNOSIS — Z510 Encounter for antineoplastic radiation therapy: Secondary | ICD-10-CM | POA: Diagnosis not present

## 2016-02-18 DIAGNOSIS — Z87891 Personal history of nicotine dependence: Secondary | ICD-10-CM | POA: Diagnosis not present

## 2016-02-18 DIAGNOSIS — C8264 Cutaneous follicle center lymphoma, lymph nodes of axilla and upper limb: Secondary | ICD-10-CM | POA: Diagnosis not present

## 2016-02-19 ENCOUNTER — Ambulatory Visit
Admission: RE | Admit: 2016-02-19 | Discharge: 2016-02-19 | Disposition: A | Discharge status: Home / Self Care | Payer: Medicare Other | Source: Ambulatory Visit | Attending: Radiation Oncology | Admitting: Radiation Oncology

## 2016-02-19 ENCOUNTER — Inpatient Hospital Stay: Payer: Medicare Other

## 2016-02-19 DIAGNOSIS — H3530 Unspecified macular degeneration: Secondary | ICD-10-CM | POA: Diagnosis not present

## 2016-02-19 DIAGNOSIS — K2270 Barrett's esophagus without dysplasia: Secondary | ICD-10-CM | POA: Diagnosis not present

## 2016-02-19 DIAGNOSIS — Z86010 Personal history of colonic polyps: Secondary | ICD-10-CM | POA: Diagnosis not present

## 2016-02-19 DIAGNOSIS — C8264 Cutaneous follicle center lymphoma, lymph nodes of axilla and upper limb: Secondary | ICD-10-CM | POA: Diagnosis not present

## 2016-02-19 DIAGNOSIS — Z8572 Personal history of non-Hodgkin lymphomas: Secondary | ICD-10-CM | POA: Diagnosis not present

## 2016-02-19 DIAGNOSIS — C884 Extranodal marginal zone B-cell lymphoma of mucosa-associated lymphoid tissue [MALT-lymphoma]: Secondary | ICD-10-CM

## 2016-02-19 DIAGNOSIS — Z7982 Long term (current) use of aspirin: Secondary | ICD-10-CM | POA: Diagnosis not present

## 2016-02-19 DIAGNOSIS — R12 Heartburn: Secondary | ICD-10-CM | POA: Diagnosis not present

## 2016-02-19 DIAGNOSIS — I10 Essential (primary) hypertension: Secondary | ICD-10-CM | POA: Diagnosis not present

## 2016-02-19 DIAGNOSIS — C8514 Unspecified B-cell lymphoma, lymph nodes of axilla and upper limb: Secondary | ICD-10-CM | POA: Diagnosis not present

## 2016-02-19 DIAGNOSIS — Z87891 Personal history of nicotine dependence: Secondary | ICD-10-CM | POA: Diagnosis not present

## 2016-02-19 DIAGNOSIS — Z510 Encounter for antineoplastic radiation therapy: Secondary | ICD-10-CM | POA: Diagnosis not present

## 2016-02-19 LAB — CBC
HCT: 46.3 % (ref 40.0–52.0)
Hemoglobin: 15.9 g/dL (ref 13.0–18.0)
MCH: 28.7 pg (ref 26.0–34.0)
MCHC: 34.3 g/dL (ref 32.0–36.0)
MCV: 83.7 fL (ref 80.0–100.0)
Platelets: 255 10*3/uL (ref 150–440)
RBC: 5.53 MIL/uL (ref 4.40–5.90)
RDW: 13.2 % (ref 11.5–14.5)
WBC: 5.3 10*3/uL (ref 3.8–10.6)

## 2016-02-20 ENCOUNTER — Ambulatory Visit
Admission: RE | Admit: 2016-02-20 | Discharge: 2016-02-20 | Disposition: A | Discharge status: Home / Self Care | Payer: Medicare Other | Source: Ambulatory Visit | Attending: Radiation Oncology | Admitting: Radiation Oncology

## 2016-02-20 DIAGNOSIS — Z86010 Personal history of colonic polyps: Secondary | ICD-10-CM | POA: Diagnosis not present

## 2016-02-20 DIAGNOSIS — K2270 Barrett's esophagus without dysplasia: Secondary | ICD-10-CM | POA: Diagnosis not present

## 2016-02-20 DIAGNOSIS — Z510 Encounter for antineoplastic radiation therapy: Secondary | ICD-10-CM | POA: Diagnosis not present

## 2016-02-20 DIAGNOSIS — C8264 Cutaneous follicle center lymphoma, lymph nodes of axilla and upper limb: Secondary | ICD-10-CM | POA: Diagnosis not present

## 2016-02-20 DIAGNOSIS — R12 Heartburn: Secondary | ICD-10-CM | POA: Diagnosis not present

## 2016-02-20 DIAGNOSIS — I10 Essential (primary) hypertension: Secondary | ICD-10-CM | POA: Diagnosis not present

## 2016-02-20 DIAGNOSIS — C8580 Other specified types of non-Hodgkin lymphoma, unspecified site: Secondary | ICD-10-CM | POA: Diagnosis not present

## 2016-02-21 ENCOUNTER — Ambulatory Visit
Admission: RE | Admit: 2016-02-21 | Discharge: 2016-02-21 | Disposition: A | Discharge status: Home / Self Care | Payer: Medicare Other | Source: Ambulatory Visit | Attending: Radiation Oncology | Admitting: Radiation Oncology

## 2016-02-21 DIAGNOSIS — R12 Heartburn: Secondary | ICD-10-CM | POA: Diagnosis not present

## 2016-02-21 DIAGNOSIS — Z86010 Personal history of colonic polyps: Secondary | ICD-10-CM | POA: Diagnosis not present

## 2016-02-21 DIAGNOSIS — I10 Essential (primary) hypertension: Secondary | ICD-10-CM | POA: Diagnosis not present

## 2016-02-21 DIAGNOSIS — C8264 Cutaneous follicle center lymphoma, lymph nodes of axilla and upper limb: Secondary | ICD-10-CM | POA: Diagnosis not present

## 2016-02-21 DIAGNOSIS — K2270 Barrett's esophagus without dysplasia: Secondary | ICD-10-CM | POA: Diagnosis not present

## 2016-02-21 DIAGNOSIS — Z510 Encounter for antineoplastic radiation therapy: Secondary | ICD-10-CM | POA: Diagnosis not present

## 2016-02-24 ENCOUNTER — Ambulatory Visit
Admission: RE | Admit: 2016-02-24 | Discharge: 2016-02-24 | Disposition: A | Discharge status: Home / Self Care | Payer: Medicare Other | Source: Ambulatory Visit | Attending: Radiation Oncology | Admitting: Radiation Oncology

## 2016-02-24 DIAGNOSIS — Z86010 Personal history of colonic polyps: Secondary | ICD-10-CM | POA: Diagnosis not present

## 2016-02-24 DIAGNOSIS — C8264 Cutaneous follicle center lymphoma, lymph nodes of axilla and upper limb: Secondary | ICD-10-CM | POA: Diagnosis not present

## 2016-02-24 DIAGNOSIS — Z510 Encounter for antineoplastic radiation therapy: Secondary | ICD-10-CM | POA: Diagnosis not present

## 2016-02-24 DIAGNOSIS — I10 Essential (primary) hypertension: Secondary | ICD-10-CM | POA: Diagnosis not present

## 2016-02-24 DIAGNOSIS — K2270 Barrett's esophagus without dysplasia: Secondary | ICD-10-CM | POA: Diagnosis not present

## 2016-02-24 DIAGNOSIS — R12 Heartburn: Secondary | ICD-10-CM | POA: Diagnosis not present

## 2016-02-25 ENCOUNTER — Ambulatory Visit
Admission: RE | Admit: 2016-02-25 | Discharge: 2016-02-25 | Disposition: A | Discharge status: Home / Self Care | Payer: Medicare Other | Source: Ambulatory Visit | Attending: Radiation Oncology | Admitting: Radiation Oncology

## 2016-02-25 DIAGNOSIS — Z86010 Personal history of colonic polyps: Secondary | ICD-10-CM | POA: Diagnosis not present

## 2016-02-25 DIAGNOSIS — Z510 Encounter for antineoplastic radiation therapy: Secondary | ICD-10-CM | POA: Diagnosis not present

## 2016-02-25 DIAGNOSIS — R12 Heartburn: Secondary | ICD-10-CM | POA: Diagnosis not present

## 2016-02-25 DIAGNOSIS — C8264 Cutaneous follicle center lymphoma, lymph nodes of axilla and upper limb: Secondary | ICD-10-CM | POA: Diagnosis not present

## 2016-02-25 DIAGNOSIS — K2270 Barrett's esophagus without dysplasia: Secondary | ICD-10-CM | POA: Diagnosis not present

## 2016-02-25 DIAGNOSIS — I10 Essential (primary) hypertension: Secondary | ICD-10-CM | POA: Diagnosis not present

## 2016-02-26 ENCOUNTER — Ambulatory Visit
Admission: RE | Admit: 2016-02-26 | Discharge: 2016-02-26 | Disposition: A | Discharge status: Home / Self Care | Payer: Medicare Other | Source: Ambulatory Visit | Attending: Radiation Oncology | Admitting: Radiation Oncology

## 2016-02-26 DIAGNOSIS — H01024 Squamous blepharitis left upper eyelid: Secondary | ICD-10-CM | POA: Diagnosis not present

## 2016-02-26 DIAGNOSIS — C8264 Cutaneous follicle center lymphoma, lymph nodes of axilla and upper limb: Secondary | ICD-10-CM | POA: Diagnosis not present

## 2016-02-26 DIAGNOSIS — Z86010 Personal history of colonic polyps: Secondary | ICD-10-CM | POA: Diagnosis not present

## 2016-02-26 DIAGNOSIS — H353121 Nonexudative age-related macular degeneration, left eye, early dry stage: Secondary | ICD-10-CM | POA: Diagnosis not present

## 2016-02-26 DIAGNOSIS — I10 Essential (primary) hypertension: Secondary | ICD-10-CM | POA: Diagnosis not present

## 2016-02-26 DIAGNOSIS — R12 Heartburn: Secondary | ICD-10-CM | POA: Diagnosis not present

## 2016-02-26 DIAGNOSIS — H04123 Dry eye syndrome of bilateral lacrimal glands: Secondary | ICD-10-CM | POA: Diagnosis not present

## 2016-02-26 DIAGNOSIS — H01021 Squamous blepharitis right upper eyelid: Secondary | ICD-10-CM | POA: Diagnosis not present

## 2016-02-26 DIAGNOSIS — K2270 Barrett's esophagus without dysplasia: Secondary | ICD-10-CM | POA: Diagnosis not present

## 2016-02-26 DIAGNOSIS — Z510 Encounter for antineoplastic radiation therapy: Secondary | ICD-10-CM | POA: Diagnosis not present

## 2016-02-27 ENCOUNTER — Ambulatory Visit
Admission: RE | Admit: 2016-02-27 | Discharge: 2016-02-27 | Disposition: A | Discharge status: Home / Self Care | Payer: Medicare Other | Source: Ambulatory Visit | Attending: Radiation Oncology | Admitting: Radiation Oncology

## 2016-02-27 DIAGNOSIS — K2270 Barrett's esophagus without dysplasia: Secondary | ICD-10-CM | POA: Diagnosis not present

## 2016-02-27 DIAGNOSIS — C8264 Cutaneous follicle center lymphoma, lymph nodes of axilla and upper limb: Secondary | ICD-10-CM | POA: Diagnosis not present

## 2016-02-27 DIAGNOSIS — I10 Essential (primary) hypertension: Secondary | ICD-10-CM | POA: Diagnosis not present

## 2016-02-27 DIAGNOSIS — Z86010 Personal history of colonic polyps: Secondary | ICD-10-CM | POA: Diagnosis not present

## 2016-02-27 DIAGNOSIS — Z510 Encounter for antineoplastic radiation therapy: Secondary | ICD-10-CM | POA: Diagnosis not present

## 2016-02-27 DIAGNOSIS — C8580 Other specified types of non-Hodgkin lymphoma, unspecified site: Secondary | ICD-10-CM | POA: Diagnosis not present

## 2016-02-27 DIAGNOSIS — R12 Heartburn: Secondary | ICD-10-CM | POA: Diagnosis not present

## 2016-02-28 ENCOUNTER — Ambulatory Visit
Admission: RE | Admit: 2016-02-28 | Discharge: 2016-02-28 | Disposition: A | Discharge status: Home / Self Care | Payer: Medicare Other | Source: Ambulatory Visit | Attending: Radiation Oncology | Admitting: Radiation Oncology

## 2016-02-28 ENCOUNTER — Ambulatory Visit: Admission: RE | Admit: 2016-02-28 | Payer: Medicare Other | Source: Ambulatory Visit

## 2016-03-02 ENCOUNTER — Ambulatory Visit
Admission: RE | Admit: 2016-03-02 | Discharge: 2016-03-02 | Disposition: A | Discharge status: Home / Self Care | Payer: Medicare Other | Source: Ambulatory Visit | Attending: Radiation Oncology | Admitting: Radiation Oncology

## 2016-03-02 DIAGNOSIS — R12 Heartburn: Secondary | ICD-10-CM | POA: Diagnosis not present

## 2016-03-02 DIAGNOSIS — I10 Essential (primary) hypertension: Secondary | ICD-10-CM | POA: Diagnosis not present

## 2016-03-02 DIAGNOSIS — C8264 Cutaneous follicle center lymphoma, lymph nodes of axilla and upper limb: Secondary | ICD-10-CM | POA: Diagnosis not present

## 2016-03-02 DIAGNOSIS — Z86010 Personal history of colonic polyps: Secondary | ICD-10-CM | POA: Diagnosis not present

## 2016-03-02 DIAGNOSIS — K2270 Barrett's esophagus without dysplasia: Secondary | ICD-10-CM | POA: Diagnosis not present

## 2016-03-02 DIAGNOSIS — Z510 Encounter for antineoplastic radiation therapy: Secondary | ICD-10-CM | POA: Diagnosis not present

## 2016-03-03 ENCOUNTER — Ambulatory Visit
Admission: RE | Admit: 2016-03-03 | Discharge: 2016-03-03 | Disposition: A | Discharge status: Home / Self Care | Payer: Medicare Other | Source: Ambulatory Visit | Attending: Radiation Oncology | Admitting: Radiation Oncology

## 2016-03-03 DIAGNOSIS — Z510 Encounter for antineoplastic radiation therapy: Secondary | ICD-10-CM | POA: Diagnosis not present

## 2016-03-03 DIAGNOSIS — K2270 Barrett's esophagus without dysplasia: Secondary | ICD-10-CM | POA: Diagnosis not present

## 2016-03-03 DIAGNOSIS — Z86010 Personal history of colonic polyps: Secondary | ICD-10-CM | POA: Diagnosis not present

## 2016-03-03 DIAGNOSIS — C8264 Cutaneous follicle center lymphoma, lymph nodes of axilla and upper limb: Secondary | ICD-10-CM | POA: Diagnosis not present

## 2016-03-03 DIAGNOSIS — I10 Essential (primary) hypertension: Secondary | ICD-10-CM | POA: Diagnosis not present

## 2016-03-03 DIAGNOSIS — R12 Heartburn: Secondary | ICD-10-CM | POA: Diagnosis not present

## 2016-03-04 ENCOUNTER — Ambulatory Visit
Admission: RE | Admit: 2016-03-04 | Discharge: 2016-03-04 | Disposition: A | Discharge status: Home / Self Care | Payer: Medicare Other | Source: Ambulatory Visit | Attending: Radiation Oncology | Admitting: Radiation Oncology

## 2016-03-04 DIAGNOSIS — G4733 Obstructive sleep apnea (adult) (pediatric): Secondary | ICD-10-CM | POA: Diagnosis not present

## 2016-03-04 DIAGNOSIS — C8264 Cutaneous follicle center lymphoma, lymph nodes of axilla and upper limb: Secondary | ICD-10-CM | POA: Diagnosis not present

## 2016-03-04 DIAGNOSIS — K2270 Barrett's esophagus without dysplasia: Secondary | ICD-10-CM | POA: Diagnosis not present

## 2016-03-04 DIAGNOSIS — R12 Heartburn: Secondary | ICD-10-CM | POA: Diagnosis not present

## 2016-03-04 DIAGNOSIS — Z86010 Personal history of colonic polyps: Secondary | ICD-10-CM | POA: Diagnosis not present

## 2016-03-04 DIAGNOSIS — Z510 Encounter for antineoplastic radiation therapy: Secondary | ICD-10-CM | POA: Diagnosis not present

## 2016-03-04 DIAGNOSIS — I10 Essential (primary) hypertension: Secondary | ICD-10-CM | POA: Diagnosis not present

## 2016-03-05 ENCOUNTER — Ambulatory Visit
Admission: RE | Admit: 2016-03-05 | Discharge: 2016-03-05 | Disposition: A | Discharge status: Home / Self Care | Payer: Medicare Other | Source: Ambulatory Visit | Attending: Radiation Oncology | Admitting: Radiation Oncology

## 2016-03-05 DIAGNOSIS — Z86010 Personal history of colonic polyps: Secondary | ICD-10-CM | POA: Diagnosis not present

## 2016-03-05 DIAGNOSIS — K2270 Barrett's esophagus without dysplasia: Secondary | ICD-10-CM | POA: Diagnosis not present

## 2016-03-05 DIAGNOSIS — C8264 Cutaneous follicle center lymphoma, lymph nodes of axilla and upper limb: Secondary | ICD-10-CM | POA: Diagnosis not present

## 2016-03-05 DIAGNOSIS — I10 Essential (primary) hypertension: Secondary | ICD-10-CM | POA: Diagnosis not present

## 2016-03-05 DIAGNOSIS — Z510 Encounter for antineoplastic radiation therapy: Secondary | ICD-10-CM | POA: Diagnosis not present

## 2016-03-05 DIAGNOSIS — R12 Heartburn: Secondary | ICD-10-CM | POA: Diagnosis not present

## 2016-03-06 ENCOUNTER — Ambulatory Visit
Admission: RE | Admit: 2016-03-06 | Discharge: 2016-03-06 | Disposition: A | Discharge status: Home / Self Care | Payer: Medicare Other | Source: Ambulatory Visit | Attending: Radiation Oncology | Admitting: Radiation Oncology

## 2016-03-06 DIAGNOSIS — Z510 Encounter for antineoplastic radiation therapy: Secondary | ICD-10-CM | POA: Diagnosis not present

## 2016-03-06 DIAGNOSIS — I10 Essential (primary) hypertension: Secondary | ICD-10-CM | POA: Diagnosis not present

## 2016-03-06 DIAGNOSIS — C8264 Cutaneous follicle center lymphoma, lymph nodes of axilla and upper limb: Secondary | ICD-10-CM | POA: Diagnosis not present

## 2016-03-06 DIAGNOSIS — K2270 Barrett's esophagus without dysplasia: Secondary | ICD-10-CM | POA: Diagnosis not present

## 2016-03-06 DIAGNOSIS — Z86010 Personal history of colonic polyps: Secondary | ICD-10-CM | POA: Diagnosis not present

## 2016-03-06 DIAGNOSIS — C8580 Other specified types of non-Hodgkin lymphoma, unspecified site: Secondary | ICD-10-CM | POA: Diagnosis not present

## 2016-03-06 DIAGNOSIS — R12 Heartburn: Secondary | ICD-10-CM | POA: Diagnosis not present

## 2016-04-20 ENCOUNTER — Ambulatory Visit
Admission: RE | Admit: 2016-04-20 | Discharge: 2016-04-20 | Disposition: A | Discharge status: Home / Self Care | Payer: Medicare Other | Source: Ambulatory Visit | Attending: Radiation Oncology | Admitting: Radiation Oncology

## 2016-04-20 ENCOUNTER — Encounter: Payer: Self-pay | Admitting: Radiation Oncology

## 2016-04-20 VITALS — BP 153/95 | HR 54 | Temp 96.7°F | Resp 20 | Wt 258.0 lb

## 2016-04-20 DIAGNOSIS — Z923 Personal history of irradiation: Secondary | ICD-10-CM | POA: Diagnosis not present

## 2016-04-20 DIAGNOSIS — C8580 Other specified types of non-Hodgkin lymphoma, unspecified site: Secondary | ICD-10-CM

## 2016-04-20 DIAGNOSIS — C8264 Cutaneous follicle center lymphoma, lymph nodes of axilla and upper limb: Secondary | ICD-10-CM | POA: Diagnosis not present

## 2016-04-20 NOTE — Addendum Note (Signed)
Encounter addended by: Daiva Huge, RN on: 04/20/2016 10:22 AM<BR>    Actions taken: Charge Capture section accepted, Order Reconciliation Section accessed, Home Medications modified

## 2016-04-20 NOTE — Addendum Note (Signed)
Encounter addended by: Mallie Darting, MD on: 04/20/2016 10:34 AM<BR>    Actions taken: Sign clinical note

## 2016-04-20 NOTE — Progress Notes (Signed)
Seen in followup today 1 month s/p completion of electron beam RT to right forearm for cutaneous lymphoma  REFERRING PROVIDER:HUSAIN,KARRAR, MD  HPI: this is a 65 yo M who returns today about 6 weeks after completion of electron beam treatmetns for a cutaneous follicular B-cell lymphoma of the right forearm. He has recovered well since the completion of RT. No f/c/night sweats. No complaints or questions. Has followup with Dr. Rogue Bussing in about 1 month.  EXAM: Vitals:   04/20/16 1015  BP: (!) 153/95  Pulse: (!) 54  Resp: 20  Temp: (!) 96.7 F (35.9 C)   Awake, alert, NAD. No epitrochlear, SCV, axillary adenopathy. Right forearm has approximately 7cm diameter area with hypopigmentation, a central scar and a small area (1x1.5 cm) fullness. No desquamation or ulceration.  ASSESSMENT:  Recovered well from RT with no complications of treatment.  PLAN: 1. F/u with Dr. Rogue Bussing as scheduled 2. F/u in radonc in 3 months 3. Call w with questions or concens  Mallie Darting, MD PhD

## 2016-04-29 DIAGNOSIS — G4733 Obstructive sleep apnea (adult) (pediatric): Secondary | ICD-10-CM | POA: Diagnosis not present

## 2016-06-01 ENCOUNTER — Encounter: Payer: Self-pay | Admitting: Internal Medicine

## 2016-06-01 ENCOUNTER — Inpatient Hospital Stay: Payer: Medicare Other | Attending: Internal Medicine | Admitting: Internal Medicine

## 2016-06-01 DIAGNOSIS — Z923 Personal history of irradiation: Secondary | ICD-10-CM | POA: Insufficient documentation

## 2016-06-01 DIAGNOSIS — Z7982 Long term (current) use of aspirin: Secondary | ICD-10-CM | POA: Insufficient documentation

## 2016-06-01 DIAGNOSIS — Z79899 Other long term (current) drug therapy: Secondary | ICD-10-CM

## 2016-06-01 DIAGNOSIS — C8264 Cutaneous follicle center lymphoma, lymph nodes of axilla and upper limb: Secondary | ICD-10-CM | POA: Diagnosis not present

## 2016-06-01 DIAGNOSIS — Z87891 Personal history of nicotine dependence: Secondary | ICD-10-CM | POA: Diagnosis not present

## 2016-06-01 DIAGNOSIS — I10 Essential (primary) hypertension: Secondary | ICD-10-CM | POA: Insufficient documentation

## 2016-06-01 DIAGNOSIS — H3530 Unspecified macular degeneration: Secondary | ICD-10-CM | POA: Diagnosis not present

## 2016-06-01 DIAGNOSIS — C884 Extranodal marginal zone B-cell lymphoma of mucosa-associated lymphoid tissue [MALT-lymphoma]: Secondary | ICD-10-CM

## 2016-06-01 NOTE — Progress Notes (Signed)
[place] NOTE  Patient Care Team: Wenda Low, MD as PCP - General (Internal Medicine) Druscilla Brownie, MD as Consulting Physician (Dermatology)  CHIEF COMPLAINTS/PURPOSE OF CONSULTATION:   Oncology History   # CUTANEOUS LYMPHOMA- [face & Right thigh s/p RT 2006; Dr.Gold; [place]]; 2012-Right Fore Arm knot [s/p Bx- May 2015 nodular dense lymphocytic infiltrate s/o Immunocytoma]  # April 2017 CUTANEOUS MARGINAL ZONE B CELL LYMPHOMA. [Dr.Lupton; [place]] RIGHT FOREARM s/p punch Biopsy; s/p RT [facility] 2017. 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  male with a history of cutaneous lymphoma as listed above- had a cutaneous marginal low-grade lymphoma right forearm at [facility] later part of April 2017; status post radiation in May 2017.  He denies any new lesions. Otherwise denies any pain. Denies any bleeding. Appetite is good. No weight loss. Denies any night sweats. No fevers.   ROS: A complete 10 point review of system is done which is negative except mentioned above in history of present illness  MEDICAL HISTORY:  Past Medical History:  Diagnosis Date  . Barrett esophagus   . Dry eye   . Heartburn 01/11/2014  . History of radiation therapy    of lymphoma on skin: face and leg  . Hydrocele   . Hyperplastic colon polyp 01/12/2014  . Hypertension   . Macular degeneration   . Non Hodgkin's lymphoma ([place]) 2006   right forearm skin, left side of face and right leg  . Renal stone     SURGICAL HISTORY: Past Surgical History:  Procedure Laterality Date  . COLONOSCOPY  01/12/2014  . ESOPHAGOGASTRODUODENOSCOPY  01/12/2014  . HYDROCELE EXCISION    . KIDNEY STONE SURGERY      SOCIAL HISTORY: moved from [place].  Social History   Social History  . Marital status: Married    Spouse  name: N/A  . Number of children: N/A  . Years of education: N/A   Occupational History  . Not on file.   Social History Main Topics  . Smoking status: Former Smoker    Packs/day: 2.00    Years: 22.00    Types: Cigarettes    Quit date: 08/22/1989  . Smokeless tobacco: Never Used     Comment: smoked x 22 yrs.  . Alcohol use 0.0 oz/week     Comment: glass of wine a day and martini on the weekend.  . Drug use: No  . Sexual activity: Not on file   Other Topics Concern  . Not on file   Social History Narrative  . No narrative on file    FAMILY HISTORY: Family History  Problem Relation Age of Onset  . Diabetes Father   . Heart murmur Mother   . Kidney disease Mother   . Aneurysm Mother   . Diabetes Brother     ALLERGIES:  has No Known Allergies.  MEDICATIONS:  Current Outpatient Prescriptions  Medication Sig Dispense Refill  . aspirin 81 MG chewable tablet Chew 81 mg by mouth daily.    . ciclopirox (LOPROX) 0.77 % cream Apply 1 application topically once a week.    . Eyelid Cleansers (AVENOVA) 0.01 % SOLN Apply 1 Dose topically 2 (two) times daily.    . Fish Oil-Cholecalciferol (OMEGA-3 + VITAMIN D3 PO) Take 4 capsules by mouth  daily.    . Liniments (BLUE-EMU SUPER STRENGTH) CREA Apply 1 application topically once.    . Multiple Vitamins-Minerals (PRESERVISION/LUTEIN PO) Take 2 capsules by mouth daily.    . naproxen sodium (ANAPROX) 220 MG tablet Take 220 mg by mouth 2 (two) times daily as needed (pain). Reported on 01/03/2016    . omeprazole (PRILOSEC) 20 MG capsule Take 20 mg by mouth daily.    Vladimir Faster Glycol-Propyl Glycol (SYSTANE OP) Apply 1 drop to eye 3 (three) times daily. Each eye    . selenium sulfide (SELSUN) 2.5 % shampoo Apply 1 application topically daily as needed for irritation.    . triamcinolone (NASACORT ALLERGY 24HR) 55 MCG/ACT AERO nasal inhaler Place 2 sprays into the nose daily.    . valsartan-hydrochlorothiazide (DIOVAN-HCT) 160-12.5 MG tablet  Take 1 tablet by mouth daily.     No current facility-administered medications for this visit.       Marland Kitchen  PHYSICAL EXAMINATION: ECOG PERFORMANCE STATUS: 0 - Asymptomatic  Vitals:   06/01/16 1034  BP: (!) 163/92  Pulse: (!) 56  Resp: 16  Temp: (!) 96.5 F (35.8 C)   Filed Weights   06/01/16 1034  Weight: 255 lb 9.6 oz (115.9 kg)    GENERAL: Well-nourished well-developed; Alert, no distress and comfortable.  He is alone.   EYES: no pallor or icterus OROPHARYNX: no thrush or ulceration; good dentition  NECK: supple, no masses felt LYMPH:  no palpable lymphadenopathy in the cervical, axillary or inguinal regions LUNGS: clear to auscultation and  No wheeze or crackles HEART/CVS: regular rate & rhythm and no murmurs; No lower extremity edema ABDOMEN: abdomen soft, non-tender and normal bowel sounds Musculoskeletal:no cyanosis of digits and no clubbing  PSYCH: alert & oriented x 3 with fluent speech NEURO: no focal motor/sensory deficits SKIN: 0.5x0.5cm nodular lesion noted on the right distal forearm [site of excision]  LABORATORY DATA:  I have reviewed the data as listed Lab Results  Component Value Date   WBC 5.3 02/19/2016   HGB 15.9 02/19/2016   HCT 46.3 02/19/2016   MCV 83.7 02/19/2016   PLT 255 02/19/2016    Recent Labs  01/03/16 1516  NA 136  K 3.9  CL 101  CO2 29  GLUCOSE 118*  BUN 16  CREATININE 0.87  CALCIUM 9.8  GFRNONAA >60  GFRAA >60  PROT 7.6  ALBUMIN 4.5  AST 35  ALT 38  ALKPHOS 102  BILITOT 0.7     ASSESSMENT & PLAN:   Primary cutaneous marginal zone B-cell lymphoma (HCC) # Right forearm nodular lesion-Status post punch Biopsy- late April 2017 [Dr.lupton; [place]]- CUTANEOUS MARGINAL ZONE B CELL LYMPHOMA. S/p RT [] May 2017. NED.   # follow up in 6 months/labs.      Cammie Sickle, MD 06/01/2016 1:18 PM

## 2016-06-01 NOTE — Assessment & Plan Note (Deleted)
#   Right forearm nodular lesion-Status post punch Biopsy- late April 2017 [Dr.lupton; [place]]- CUTANEOUS MARGINAL ZONE B CELL LYMPHOMA. S/p RT [] May 2017. NED.   # follow up in 6 months/labs.

## 2016-06-01 NOTE — Progress Notes (Signed)
Arthritis is worsening otherwise no other concerns

## 2016-06-01 NOTE — Assessment & Plan Note (Signed)
#   Right forearm nodular lesion-Status post punch Biopsy- late April 2017 [Dr.lupton; [place]]- CUTANEOUS MARGINAL ZONE B CELL LYMPHOMA. S/p RT [] May 2017. NED.   # follow up in 6 months/labs.

## 2016-06-30 DIAGNOSIS — Z23 Encounter for immunization: Secondary | ICD-10-CM | POA: Diagnosis not present

## 2016-06-30 DIAGNOSIS — D1801 Hemangioma of skin and subcutaneous tissue: Secondary | ICD-10-CM | POA: Diagnosis not present

## 2016-06-30 DIAGNOSIS — D235 Other benign neoplasm of skin of trunk: Secondary | ICD-10-CM | POA: Diagnosis not present

## 2016-06-30 DIAGNOSIS — B360 Pityriasis versicolor: Secondary | ICD-10-CM | POA: Diagnosis not present

## 2016-06-30 DIAGNOSIS — L905 Scar conditions and fibrosis of skin: Secondary | ICD-10-CM | POA: Diagnosis not present

## 2016-06-30 DIAGNOSIS — L821 Other seborrheic keratosis: Secondary | ICD-10-CM | POA: Diagnosis not present

## 2016-06-30 DIAGNOSIS — L814 Other melanin hyperpigmentation: Secondary | ICD-10-CM | POA: Diagnosis not present

## 2016-07-20 ENCOUNTER — Encounter: Payer: Self-pay | Admitting: Radiation Oncology

## 2016-07-20 ENCOUNTER — Ambulatory Visit
Admission: RE | Admit: 2016-07-20 | Discharge: 2016-07-20 | Disposition: A | Discharge status: Home / Self Care | Payer: Medicare Other | Source: Ambulatory Visit | Attending: Radiation Oncology | Admitting: Radiation Oncology

## 2016-07-20 VITALS — BP 135/81 | HR 54 | Temp 96.6°F | Resp 20 | Wt 261.6 lb

## 2016-07-20 DIAGNOSIS — Z8572 Personal history of non-Hodgkin lymphomas: Secondary | ICD-10-CM | POA: Diagnosis not present

## 2016-07-20 DIAGNOSIS — C884 Extranodal marginal zone B-cell lymphoma of mucosa-associated lymphoid tissue [MALT-lymphoma]: Secondary | ICD-10-CM

## 2016-07-20 DIAGNOSIS — Z923 Personal history of irradiation: Secondary | ICD-10-CM | POA: Diagnosis not present

## 2016-07-20 NOTE — Progress Notes (Signed)
Radiation Oncology Follow up Note  Name: Michael Huffman   Date:   07/20/2016 MRN:  [id number] DOB: 10-20-1950    This 65 y.o. male presents to the clinic today for four-month follow-up status post lymphoma of his right upper extremity.  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 He also was treated back in 2006 4 continuous follicular B-cell lymphoma his right thigh. He is currently doing well. Specifically denies any new areas of nodularity or mass. Does have a small slightly nodular density in the palm of his left hand in my estimation not thought to be clinically significant. He specifically denies fever chills or night sweats.   COMPLICATIONS OF TREATMENT: none  FOLLOW UP COMPLIANCE: keeps appointments   PHYSICAL EXAM:  BP 135/81   Pulse (!) 54   Temp (!) 96.6 F (35.9 C)   Resp 20   Wt 261 lb 9.2 oz (118.6 kg)   BMI 33.58 kg/m  Patient is area of alopecia in the area of his distal right upper extremity consistent with area of prior cutaneous lymphoma. No evidence of axillary supraclavicular cervical or other peripheral adenopathy is identified. Smooth nontender nodular density present in the left palm not thought to be clinically significant. Well-developed well-nourished patient in NAD. HEENT reveals PERLA, EOMI, discs not visualized.  Oral cavity is clear. No oral mucosal lesions are identified. Neck is clear without evidence of cervical or supraclavicular adenopathy. Lungs are clear to A&P. Cardiac examination is essentially unremarkable with regular rate and rhythm without murmur rub or thrill. Abdomen is benign with no organomegaly or masses noted. Motor sensory and DTR levels are equal and symmetric in the upper and lower extremities. Cranial nerves II through XII are grossly intact. Proprioception is intact. No peripheral  adenopathy or edema is identified. No motor or sensory levels are noted. Crude visual fields are within normal range.  RADIOLOGY RESULTS: No current films for review   PLAN: At the present time I'm going to turn follow-up care over to medical oncology. Patient continues to do well with no evidence of disease. Patient knows to call with any concerns. I will be happy to reevaluate the patient any time should further oncology support he needed.  I would like to take this opportunity to thank you for allowing me to participate in the care of your patient.Armstead Peaks., MD

## 2016-07-31 IMAGING — US US ABDOMINAL AORTA SCREENING AAA
1 series · 9 of 9 positions shown · non-contrast
Comparison: None.

CLINICAL DATA: Male between 65-75 years of age with a smoking
history.

EXAM:
US ABDOMINAL AORTA MEDICARE SCREENING
TECHNIQUE: Ultrasound examination of the abdominal aorta was performed as a
screening evaluation for abdominal aortic aneurysm.

[Series 1: us abdominal aorta screening aaa · 0.45mm/px · 9 of 9 slices shown]
[im 1/9]
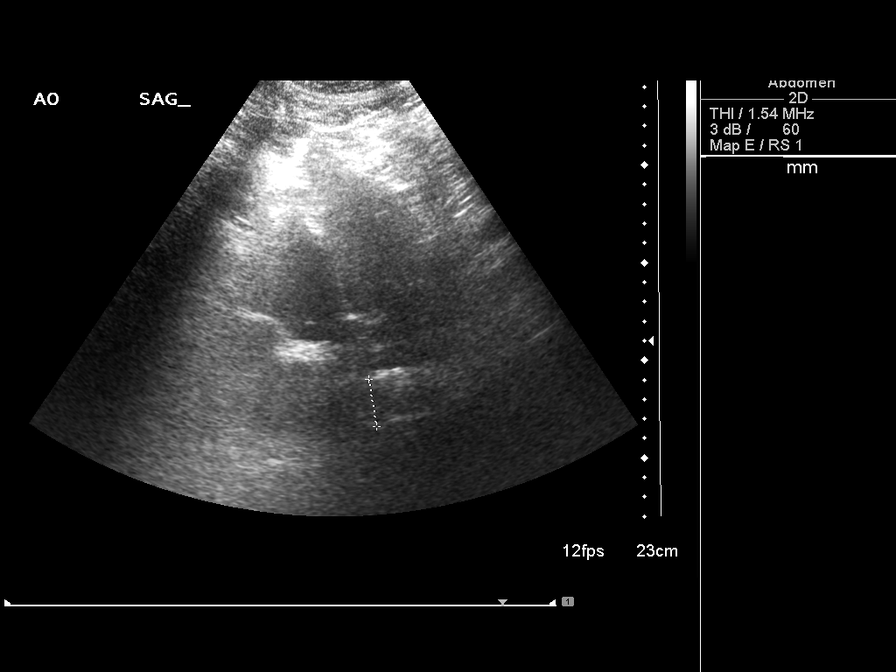
[im 2/9]
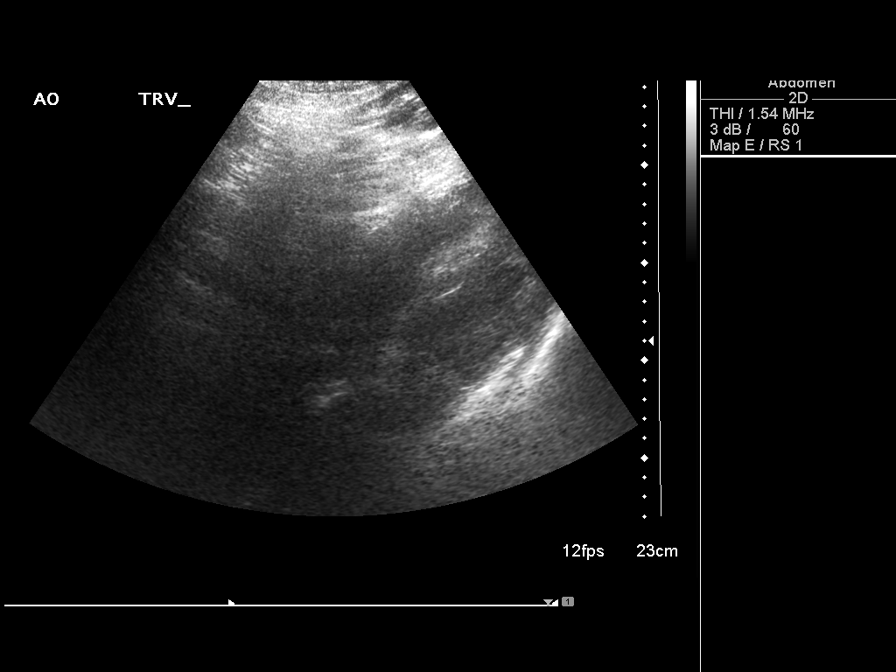
[im 3/9]
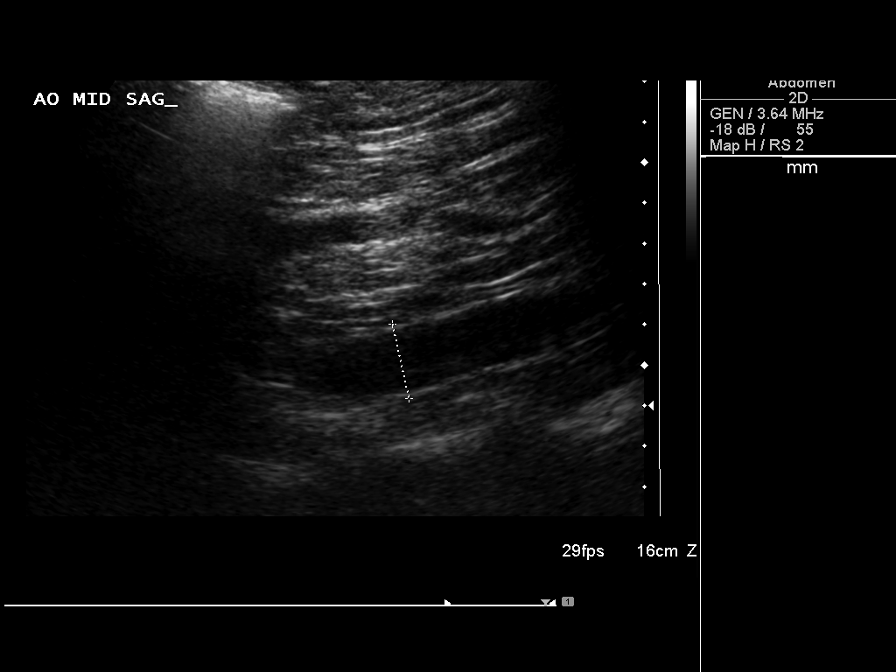
[im 4/9]
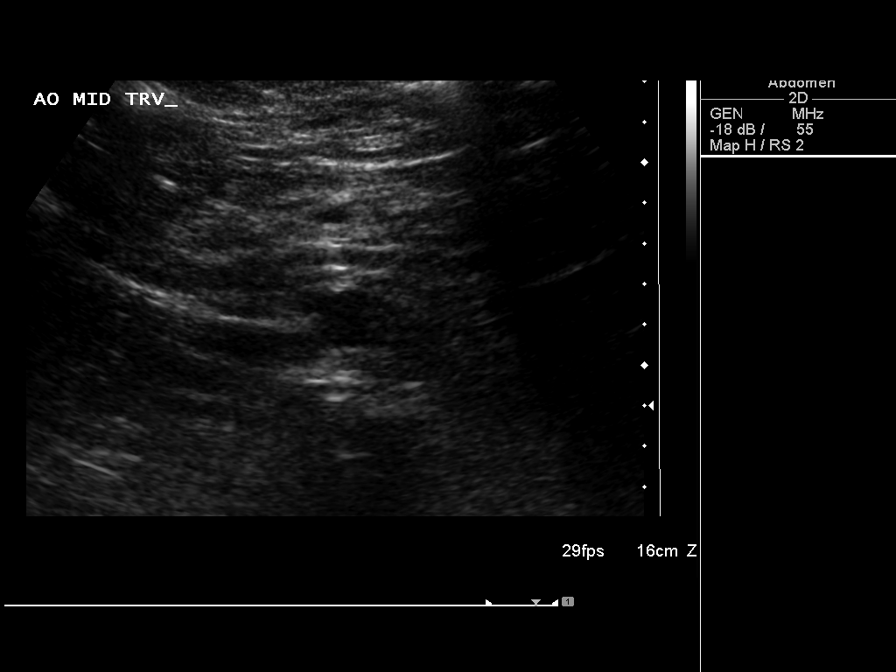
[im 5/9]
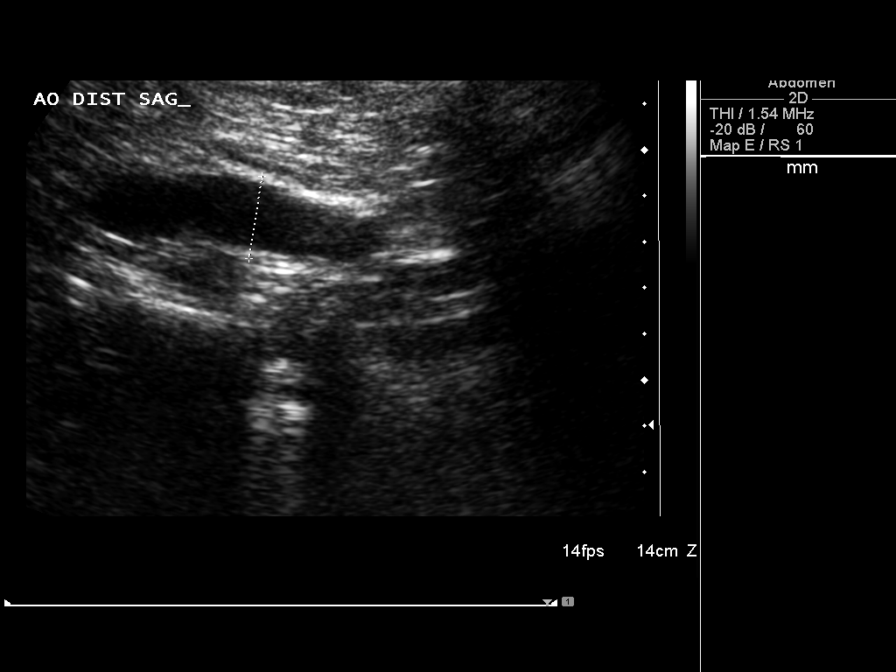
[im 6/9]
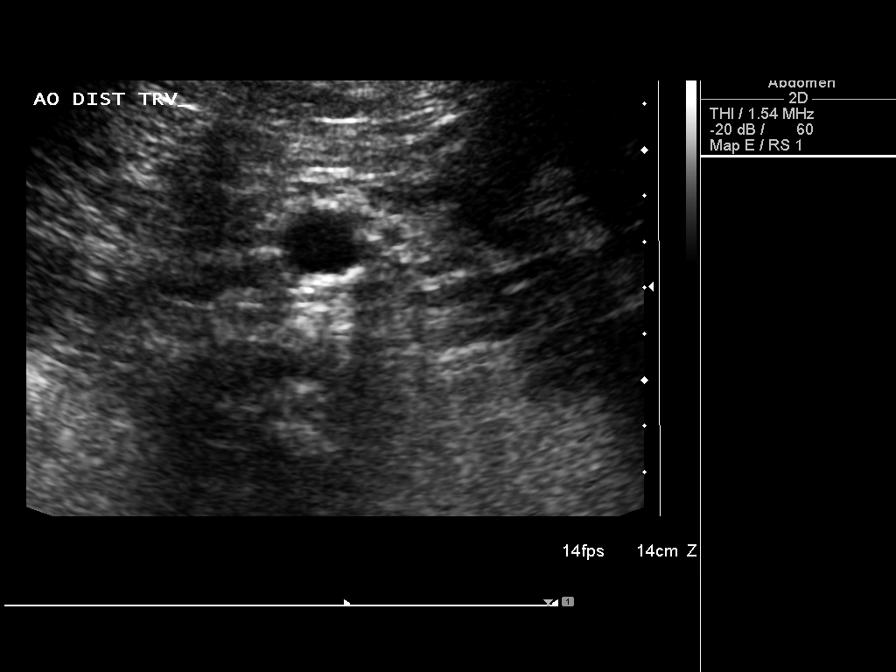
[im 7/9]
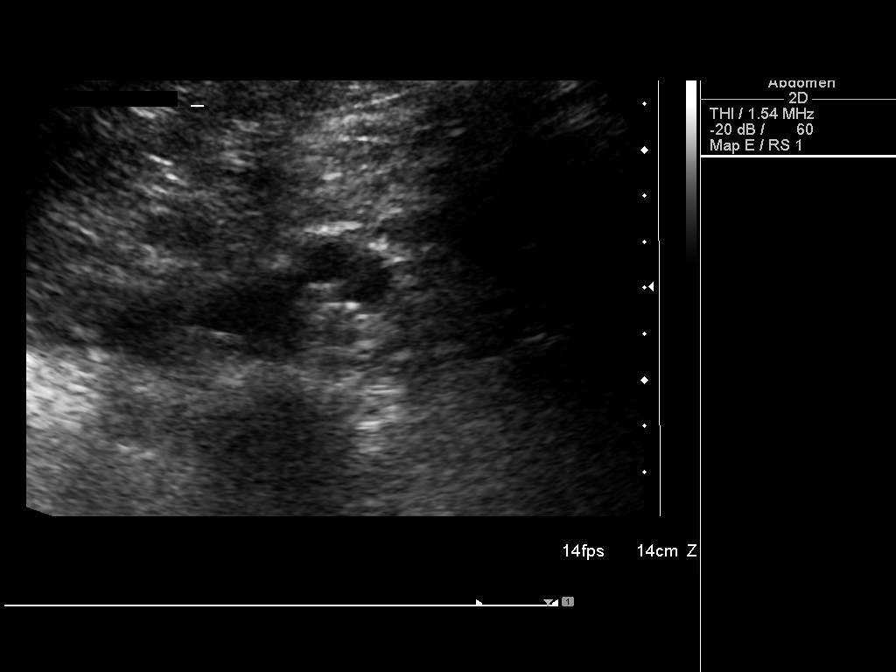
[im 8/9]
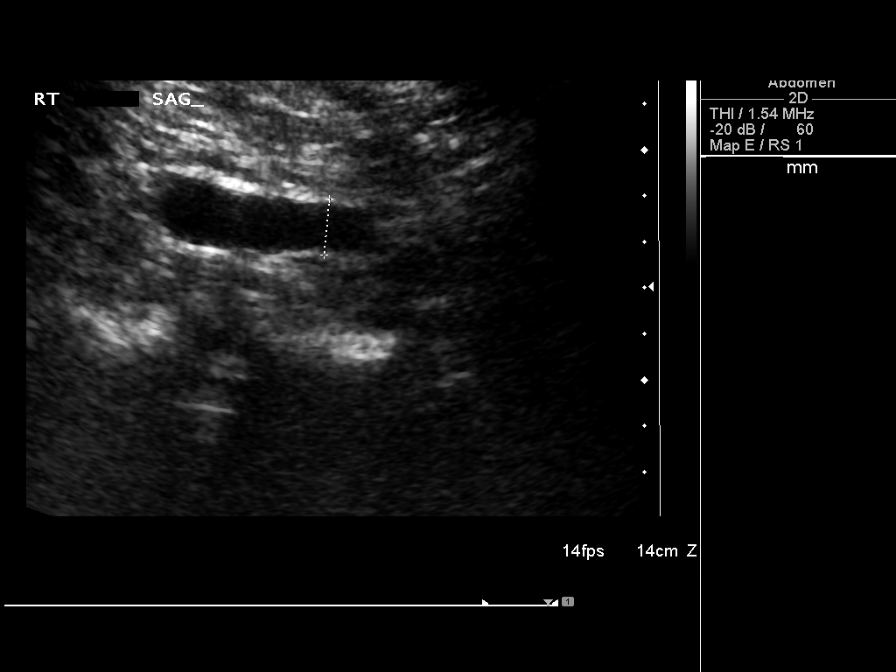
[im 9/9]
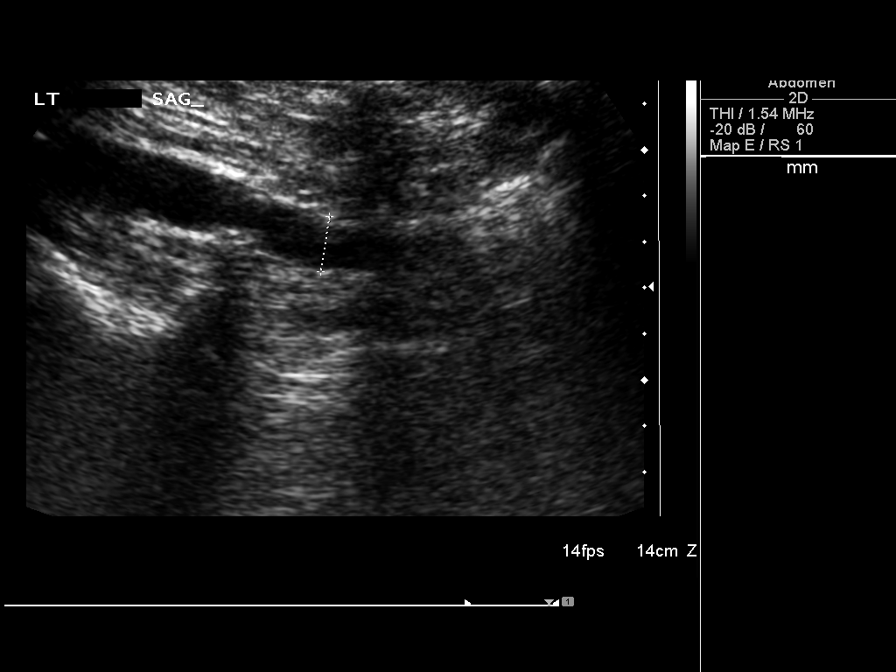

[9 of 9 positions shown; findings below may reference images not displayed]

FINDINGS: Maximum Diameter: 2.5 cm

ABDOMINAL AORTA

Proximal:  2.5 cm

Mid:  1.9 cm

Distal:  1.8 cm

Mild plaque visualized in the distal aorta and bilateral proximal
common iliac arteries.
IMPRESSION: No evidence of abdominal aortic aneurysm.

## 2016-09-30 DIAGNOSIS — G4733 Obstructive sleep apnea (adult) (pediatric): Secondary | ICD-10-CM | POA: Diagnosis not present

## 2016-09-30 DIAGNOSIS — I10 Essential (primary) hypertension: Secondary | ICD-10-CM | POA: Diagnosis not present

## 2016-09-30 DIAGNOSIS — R0981 Nasal congestion: Secondary | ICD-10-CM | POA: Diagnosis not present

## 2016-09-30 DIAGNOSIS — K219 Gastro-esophageal reflux disease without esophagitis: Secondary | ICD-10-CM | POA: Diagnosis not present

## 2016-09-30 DIAGNOSIS — H9319 Tinnitus, unspecified ear: Secondary | ICD-10-CM | POA: Diagnosis not present

## 2016-09-30 DIAGNOSIS — C8590 Non-Hodgkin lymphoma, unspecified, unspecified site: Secondary | ICD-10-CM | POA: Diagnosis not present

## 2016-11-12 DIAGNOSIS — M170 Bilateral primary osteoarthritis of knee: Secondary | ICD-10-CM | POA: Diagnosis not present

## 2016-11-12 DIAGNOSIS — M25561 Pain in right knee: Secondary | ICD-10-CM | POA: Diagnosis not present

## 2016-11-12 DIAGNOSIS — M25562 Pain in left knee: Secondary | ICD-10-CM | POA: Diagnosis not present

## 2016-11-18 DIAGNOSIS — M1712 Unilateral primary osteoarthritis, left knee: Secondary | ICD-10-CM | POA: Diagnosis not present

## 2016-11-18 DIAGNOSIS — M25562 Pain in left knee: Secondary | ICD-10-CM | POA: Diagnosis not present

## 2016-11-25 DIAGNOSIS — M25561 Pain in right knee: Secondary | ICD-10-CM | POA: Diagnosis not present

## 2016-11-25 DIAGNOSIS — M1711 Unilateral primary osteoarthritis, right knee: Secondary | ICD-10-CM | POA: Diagnosis not present

## 2016-11-30 ENCOUNTER — Ambulatory Visit: Payer: Medicare Other | Admitting: Internal Medicine

## 2016-11-30 ENCOUNTER — Other Ambulatory Visit: Payer: Medicare Other

## 2016-12-09 DIAGNOSIS — M25562 Pain in left knee: Secondary | ICD-10-CM | POA: Diagnosis not present

## 2016-12-09 DIAGNOSIS — M170 Bilateral primary osteoarthritis of knee: Secondary | ICD-10-CM | POA: Diagnosis not present

## 2016-12-09 DIAGNOSIS — M25561 Pain in right knee: Secondary | ICD-10-CM | POA: Diagnosis not present

## 2016-12-14 ENCOUNTER — Inpatient Hospital Stay: Payer: Medicare Other | Attending: Internal Medicine

## 2016-12-14 ENCOUNTER — Inpatient Hospital Stay (HOSPITAL_BASED_OUTPATIENT_CLINIC_OR_DEPARTMENT_OTHER): Payer: Medicare Other | Admitting: Internal Medicine

## 2016-12-14 VITALS — BP 124/67 | HR 55 | Temp 96.7°F | Resp 18 | Wt 266.4 lb

## 2016-12-14 DIAGNOSIS — Z79899 Other long term (current) drug therapy: Secondary | ICD-10-CM | POA: Diagnosis not present

## 2016-12-14 DIAGNOSIS — Z8572 Personal history of non-Hodgkin lymphomas: Secondary | ICD-10-CM | POA: Diagnosis not present

## 2016-12-14 DIAGNOSIS — C884 Extranodal marginal zone B-cell lymphoma of mucosa-associated lymphoid tissue [MALT-lymphoma]: Secondary | ICD-10-CM

## 2016-12-14 DIAGNOSIS — H3530 Unspecified macular degeneration: Secondary | ICD-10-CM | POA: Insufficient documentation

## 2016-12-14 DIAGNOSIS — Z7982 Long term (current) use of aspirin: Secondary | ICD-10-CM | POA: Insufficient documentation

## 2016-12-14 DIAGNOSIS — I10 Essential (primary) hypertension: Secondary | ICD-10-CM | POA: Insufficient documentation

## 2016-12-14 DIAGNOSIS — Z923 Personal history of irradiation: Secondary | ICD-10-CM | POA: Insufficient documentation

## 2016-12-14 DIAGNOSIS — Z87891 Personal history of nicotine dependence: Secondary | ICD-10-CM | POA: Insufficient documentation

## 2016-12-14 LAB — COMPREHENSIVE METABOLIC PANEL
ALT: 44 U/L (ref 17–63)
AST: 30 U/L (ref 15–41)
Albumin: 4.2 g/dL (ref 3.5–5.0)
Alkaline Phosphatase: 106 U/L (ref 38–126)
Anion gap: 3 — ABNORMAL LOW (ref 5–15)
BUN: 14 mg/dL (ref 6–20)
CO2: 31 mmol/L (ref 22–32)
Calcium: 9 mg/dL (ref 8.9–10.3)
Chloride: 103 mmol/L (ref 101–111)
Creatinine, Ser: 0.76 mg/dL (ref 0.61–1.24)
GFR calc Af Amer: >60 mL/min (ref >60)
GFR calc non Af Amer: >60 mL/min (ref >60)
Glucose, Bld: 113 mg/dL — ABNORMAL HIGH (ref 65–99)
Potassium: 3.9 mmol/L (ref 3.5–5.1)
Sodium: 137 mmol/L (ref 135–145)
Total Bilirubin: 0.8 mg/dL (ref 0.3–1.2)
Total Protein: 7.5 g/dL (ref 6.5–8.1)

## 2016-12-14 LAB — CBC WITH DIFFERENTIAL/PLATELET
Basophils Absolute: 0 10*3/uL (ref 0–0.1)
Basophils Relative: 1 %
Eosinophils Absolute: 0.2 10*3/uL (ref 0–0.7)
Eosinophils Relative: 5 %
HCT: 45.8 % (ref 40.0–52.0)
Hemoglobin: 15.7 g/dL (ref 13.0–18.0)
Lymphocytes Relative: 33 %
Lymphs Abs: 1.8 10*3/uL (ref 1.0–3.6)
MCH: 28.7 pg (ref 26.0–34.0)
MCHC: 34.2 g/dL (ref 32.0–36.0)
MCV: 83.9 fL (ref 80.0–100.0)
Monocytes Absolute: 0.6 10*3/uL (ref 0.2–1.0)
Monocytes Relative: 11 %
Neutro Abs: 2.7 10*3/uL (ref 1.4–6.5)
Neutrophils Relative %: 50 %
Platelets: 272 10*3/uL (ref 150–440)
RBC: 5.46 MIL/uL (ref 4.40–5.90)
RDW: 13.1 % (ref 11.5–14.5)
WBC: 5.3 10*3/uL (ref 3.8–10.6)

## 2016-12-14 LAB — LACTATE DEHYDROGENASE: LDH: 134 U/L (ref 98–192)

## 2016-12-14 NOTE — Progress Notes (Signed)
[place] NOTE  Patient Care Team: Wenda Low, MD as PCP - General (Internal Medicine) Druscilla Brownie, MD as Consulting Physician (Dermatology)  CHIEF COMPLAINTS/PURPOSE OF CONSULTATION:   Oncology History   # CUTANEOUS LYMPHOMA- [face & Right thigh s/p RT 2006; Dr.Gold; [place]]; 2012-Right Fore Arm knot [s/p Bx- May 2015 nodular dense lymphocytic infiltrate s/o Immunocytoma]  # April 2017 CUTANEOUS MARGINAL ZONE B CELL LYMPHOMA. [Dr.Lupton; [place]] RIGHT FOREARM s/p punch Biopsy; s/p RT [facility] 2017. 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  male with above history of  cutaneous marginal low-grade lymphoma right forearm at [facility] in April 2017; status post radiation in May 2017.  He denies any new lesions. He follows up with dermatologist on regular basis. Otherwise denies any pain. Denies any bleeding. Appetite is good. No weight loss. Denies any night sweats. No fevers.   ROS: A complete 10 point review of system is done which is negative except mentioned above in history of present illness  MEDICAL HISTORY:  Past Medical History:  Diagnosis Date  . Barrett esophagus   . Dry eye   . Heartburn 01/11/2014  . History of radiation therapy    of lymphoma on skin: face and leg  . Hydrocele   . Hyperplastic colon polyp 01/12/2014  . Hypertension   . Macular degeneration   . Non Hodgkin's lymphoma ([place]) 2006   right forearm skin, left side of face and right leg  . Renal stone     SURGICAL HISTORY: Past Surgical History:  Procedure Laterality Date  . COLONOSCOPY  01/12/2014  . ESOPHAGOGASTRODUODENOSCOPY  01/12/2014  . HYDROCELE EXCISION    . KIDNEY STONE SURGERY      SOCIAL HISTORY: moved from [place].  Social History   Social History  . Marital status: Married    Spouse  name: N/A  . Number of children: N/A  . Years of education: N/A   Occupational History  . Not on file.   Social History Main Topics  . Smoking status: Former Smoker    Packs/day: 2.00    Years: 22.00    Types: Cigarettes    Quit date: 08/22/1989  . Smokeless tobacco: Never Used     Comment: smoked x 22 yrs.  . Alcohol use 0.0 oz/week     Comment: glass of wine a day and martini on the weekend.  . Drug use: No  . Sexual activity: Not on file   Other Topics Concern  . Not on file   Social History Narrative  . No narrative on file    FAMILY HISTORY: Family History  Problem Relation Age of Onset  . Diabetes Father   . Heart murmur Mother   . Kidney disease Mother   . Aneurysm Mother   . Diabetes Brother     ALLERGIES:  has No Known Allergies.  MEDICATIONS:  Current Outpatient Prescriptions  Medication Sig Dispense Refill  . aspirin 81 MG chewable tablet Chew 81 mg by mouth daily.    . Azelastine HCl 0.15 % SOLN APPLY 1 SPRAY IN EACH NOSTRIL TWICE A DAY NASALLY 30 DAY(S)  6  . ciclopirox (LOPROX) 0.77 % cream Apply 1 application topically once a week.    . Eyelid Cleansers (AVENOVA) 0.01 % SOLN Apply 1 Dose  topically 2 (two) times daily.    . Fish Oil-Cholecalciferol (OMEGA-3 + VITAMIN D3 PO) Take 4 capsules by mouth daily.    . Liniments (BLUE-EMU SUPER STRENGTH) CREA Apply 1 application topically once.    . Multiple Vitamins-Minerals (PRESERVISION/LUTEIN PO) Take 2 capsules by mouth daily.    . naproxen sodium (ANAPROX) 220 MG tablet Take 220 mg by mouth 2 (two) times daily as needed (pain). Reported on 01/03/2016    . omeprazole (PRILOSEC) 20 MG capsule Take 20 mg by mouth daily.    Vladimir Faster Glycol-Propyl Glycol (SYSTANE OP) Apply 1 drop to eye 3 (three) times daily. Each eye    . selenium sulfide (SELSUN) 2.5 % shampoo Apply 1 application topically daily as needed for irritation.    . valsartan-hydrochlorothiazide (DIOVAN-HCT) 160-12.5 MG tablet Take 1 tablet by  mouth daily.     No current facility-administered medications for this visit.       Marland Kitchen  PHYSICAL EXAMINATION: ECOG PERFORMANCE STATUS: 0 - Asymptomatic  Vitals:   12/14/16 1439  BP: 124/67  Pulse: (!) 55  Resp: 18  Temp: (!) 96.7 F (35.9 C)   Filed Weights   12/14/16 1439  Weight: 266 lb 6 oz (120.8 kg)    GENERAL: Well-nourished well-developed; Alert, no distress and comfortable.  He is alone.   EYES: no pallor or icterus OROPHARYNX: no thrush or ulceration; good dentition  NECK: supple, no masses felt LYMPH:  no palpable lymphadenopathy in the cervical, axillary or inguinal regions LUNGS: clear to auscultation and  No wheeze or crackles HEART/CVS: regular rate & rhythm and no murmurs; No lower extremity edema ABDOMEN: abdomen soft, non-tender and normal bowel sounds Musculoskeletal:no cyanosis of digits and no clubbing  PSYCH: alert & oriented x 3 with fluent speech NEURO: no focal motor/sensory deficits SKIN: 0.5x0.5cm nodular lesion noted on the right distal forearm [site of excision]  LABORATORY DATA:  I have reviewed the data as listed Lab Results  Component Value Date   WBC 5.3 12/14/2016   HGB 15.7 12/14/2016   HCT 45.8 12/14/2016   MCV 83.9 12/14/2016   PLT 272 12/14/2016    Recent Labs  01/03/16 1516 12/14/16 1415  NA 136 137  K 3.9 3.9  CL 101 103  CO2 29 31  GLUCOSE 118* 113*  BUN 16 14  CREATININE 0.87 0.76  CALCIUM 9.8 9.0  GFRNONAA >60 >60  GFRAA >60 >60  PROT 7.6 7.5  ALBUMIN 4.5 4.2  AST 35 30  ALT 38 44  ALKPHOS 102 106  BILITOT 0.7 0.8     ASSESSMENT & PLAN:   Primary cutaneous marginal zone B-cell lymphoma (HCC) # Right forearm nodular lesion-Status post punch Biopsy- late April 2017 [Dr.lupton; [place]]- CUTANEOUS MARGINAL ZONE B CELL LYMPHOMA. S/p RT May 2017.    # Clinically no evidence of recurrence.  # follow up in 6 months/labs.      Cammie Sickle, MD 12/18/2016 6:10 PM

## 2016-12-14 NOTE — Assessment & Plan Note (Addendum)
#   Right forearm nodular lesion-Status post punch Biopsy- late April 2017 [Dr.lupton; [place]]- CUTANEOUS MARGINAL ZONE B CELL LYMPHOMA. S/p RT May 2017.    # Clinically no evidence of recurrence.  # follow up in 6 months/labs.

## 2016-12-14 NOTE — Progress Notes (Signed)
Patient here today for follow up.  Patient states no new concerns today  

## 2016-12-29 DIAGNOSIS — L821 Other seborrheic keratosis: Secondary | ICD-10-CM | POA: Diagnosis not present

## 2016-12-29 DIAGNOSIS — B360 Pityriasis versicolor: Secondary | ICD-10-CM | POA: Diagnosis not present

## 2016-12-29 DIAGNOSIS — D225 Melanocytic nevi of trunk: Secondary | ICD-10-CM | POA: Diagnosis not present

## 2016-12-29 DIAGNOSIS — L309 Dermatitis, unspecified: Secondary | ICD-10-CM | POA: Diagnosis not present

## 2016-12-29 DIAGNOSIS — L814 Other melanin hyperpigmentation: Secondary | ICD-10-CM | POA: Diagnosis not present

## 2017-02-23 DIAGNOSIS — Z1389 Encounter for screening for other disorder: Secondary | ICD-10-CM | POA: Diagnosis not present

## 2017-02-23 DIAGNOSIS — Z1159 Encounter for screening for other viral diseases: Secondary | ICD-10-CM | POA: Diagnosis not present

## 2017-02-23 DIAGNOSIS — K219 Gastro-esophageal reflux disease without esophagitis: Secondary | ICD-10-CM | POA: Diagnosis not present

## 2017-02-23 DIAGNOSIS — Z125 Encounter for screening for malignant neoplasm of prostate: Secondary | ICD-10-CM | POA: Diagnosis not present

## 2017-02-23 DIAGNOSIS — R7309 Other abnormal glucose: Secondary | ICD-10-CM | POA: Diagnosis not present

## 2017-02-23 DIAGNOSIS — R7303 Prediabetes: Secondary | ICD-10-CM | POA: Diagnosis not present

## 2017-02-23 DIAGNOSIS — I10 Essential (primary) hypertension: Secondary | ICD-10-CM | POA: Diagnosis not present

## 2017-02-23 DIAGNOSIS — E7800 Pure hypercholesterolemia, unspecified: Secondary | ICD-10-CM | POA: Diagnosis not present

## 2017-02-23 DIAGNOSIS — C8590 Non-Hodgkin lymphoma, unspecified, unspecified site: Secondary | ICD-10-CM | POA: Diagnosis not present

## 2017-02-23 DIAGNOSIS — G4733 Obstructive sleep apnea (adult) (pediatric): Secondary | ICD-10-CM | POA: Diagnosis not present

## 2017-02-23 DIAGNOSIS — Z0000 Encounter for general adult medical examination without abnormal findings: Secondary | ICD-10-CM | POA: Diagnosis not present

## 2017-03-24 DIAGNOSIS — L237 Allergic contact dermatitis due to plants, except food: Secondary | ICD-10-CM | POA: Diagnosis not present

## 2017-04-05 DIAGNOSIS — E7800 Pure hypercholesterolemia, unspecified: Secondary | ICD-10-CM | POA: Diagnosis not present

## 2017-04-13 ENCOUNTER — Telehealth: Payer: Self-pay | Admitting: *Deleted

## 2017-04-13 NOTE — Telephone Encounter (Signed)
Patient called and states Dr Lysle Rubens his PCP is wanting him to get the shingles vaccine. There is a new one on the market and Dr B did not want the old one given, he is asking if he can have the new vaccine. Please advise

## 2017-04-15 NOTE — Telephone Encounter (Signed)
Patient notified of MD decision

## 2017-04-15 NOTE — Telephone Encounter (Signed)
Per Dr. Rogue Bussing, he states it is okay for patient to have the Shingrix vaccine, but NOT Zostavax.

## 2017-05-11 ENCOUNTER — Emergency Department (HOSPITAL_COMMUNITY)
Admission: EM | Admit: 2017-05-11 | Discharge: 2017-05-11 | Disposition: A | Discharge status: Home / Self Care | Payer: Medicare Other | Attending: Emergency Medicine | Admitting: Emergency Medicine

## 2017-05-11 ENCOUNTER — Emergency Department (HOSPITAL_COMMUNITY): Payer: Medicare Other

## 2017-05-11 ENCOUNTER — Encounter (HOSPITAL_COMMUNITY): Payer: Self-pay | Admitting: Emergency Medicine

## 2017-05-11 DIAGNOSIS — Z87891 Personal history of nicotine dependence: Secondary | ICD-10-CM | POA: Insufficient documentation

## 2017-05-11 DIAGNOSIS — I10 Essential (primary) hypertension: Secondary | ICD-10-CM | POA: Insufficient documentation

## 2017-05-11 DIAGNOSIS — N200 Calculus of kidney: Secondary | ICD-10-CM | POA: Insufficient documentation

## 2017-05-11 DIAGNOSIS — N201 Calculus of ureter: Secondary | ICD-10-CM | POA: Diagnosis not present

## 2017-05-11 DIAGNOSIS — R1084 Generalized abdominal pain: Secondary | ICD-10-CM | POA: Diagnosis present

## 2017-05-11 DIAGNOSIS — K5730 Diverticulosis of large intestine without perforation or abscess without bleeding: Secondary | ICD-10-CM | POA: Diagnosis not present

## 2017-05-11 DIAGNOSIS — Z79899 Other long term (current) drug therapy: Secondary | ICD-10-CM | POA: Diagnosis not present

## 2017-05-11 DIAGNOSIS — C8590 Non-Hodgkin lymphoma, unspecified, unspecified site: Secondary | ICD-10-CM | POA: Diagnosis not present

## 2017-05-11 DIAGNOSIS — N132 Hydronephrosis with renal and ureteral calculous obstruction: Secondary | ICD-10-CM | POA: Diagnosis not present

## 2017-05-11 LAB — BASIC METABOLIC PANEL
Anion gap: 8 (ref 5–15)
BUN: 17 mg/dL (ref 6–20)
CO2: 25 mmol/L (ref 22–32)
Calcium: 9.2 mg/dL (ref 8.9–10.3)
Chloride: 105 mmol/L (ref 101–111)
Creatinine, Ser: 0.85 mg/dL (ref 0.61–1.24)
GFR calc Af Amer: >60 mL/min (ref >60)
GFR calc non Af Amer: >60 mL/min (ref >60)
Glucose, Bld: 106 mg/dL — ABNORMAL HIGH (ref 65–99)
Potassium: 3.9 mmol/L (ref 3.5–5.1)
Sodium: 138 mmol/L (ref 135–145)

## 2017-05-11 LAB — CBC WITH DIFFERENTIAL/PLATELET
Basophils Absolute: 0 10*3/uL (ref 0.0–0.1)
Basophils Relative: 1 %
Eosinophils Absolute: 0.2 10*3/uL (ref 0.0–0.7)
Eosinophils Relative: 3 %
HCT: 46.4 % (ref 39.0–52.0)
Hemoglobin: 15.7 g/dL (ref 13.0–17.0)
Lymphocytes Relative: 22 %
Lymphs Abs: 1.3 10*3/uL (ref 0.7–4.0)
MCH: 28.1 pg (ref 26.0–34.0)
MCHC: 33.8 g/dL (ref 30.0–36.0)
MCV: 83.2 fL (ref 78.0–100.0)
Monocytes Absolute: 0.5 10*3/uL (ref 0.1–1.0)
Monocytes Relative: 8 %
Neutro Abs: 4.1 10*3/uL (ref 1.7–7.7)
Neutrophils Relative %: 66 %
Platelets: 240 10*3/uL (ref 150–400)
RBC: 5.58 MIL/uL (ref 4.22–5.81)
RDW: 13 % (ref 11.5–15.5)
WBC: 6 10*3/uL (ref 4.0–10.5)

## 2017-05-11 LAB — URINALYSIS, ROUTINE W REFLEX MICROSCOPIC
Bilirubin Urine: NEGATIVE
Glucose, UA: NEGATIVE mg/dL
Hgb urine dipstick: NEGATIVE
Ketones, ur: NEGATIVE mg/dL
Leukocytes, UA: NEGATIVE
Nitrite: NEGATIVE
Protein, ur: NEGATIVE mg/dL
Specific Gravity, Urine: 1.026 (ref 1.005–1.030)
pH: 6 (ref 5.0–8.0)

## 2017-05-11 MED ORDER — ONDANSETRON HCL 4 MG/2ML IJ SOLN
4.0000 mg | Freq: Once | INTRAMUSCULAR | Status: AC
  Administered 2017-05-11: 4 mg via INTRAVENOUS
  Filled 2017-05-11: qty 2

## 2017-05-11 MED ORDER — HYDROCODONE-ACETAMINOPHEN 5-325 MG PO TABS: 1.0000 | ORAL_TABLET | Freq: Four times a day (QID) | ORAL | 0 refills | Status: DC | PRN

## 2017-05-11 MED ORDER — KETOROLAC TROMETHAMINE 30 MG/ML IJ SOLN
30.0000 mg | Freq: Once | INTRAMUSCULAR | Status: AC
  Administered 2017-05-11: 30 mg via INTRAVENOUS
  Filled 2017-05-11: qty 1

## 2017-05-11 NOTE — ED Provider Notes (Signed)
[facility] DEPT Provider Note   CSN: [id number] Arrival date & time: 05/11/17  0906     History   Chief Complaint Chief Complaint  Patient presents with  . Flank Pain  . Back Pain    HPI Michael Huffman is a 66 y.o. male.  Patient is a 66 year old male with past medical history of hypertension, hypercholesterolemia, and prior renal calculi. He presents today for evaluation of right flank pain. This started suddenly while he was at the [facility] for preparing to take a driver's test. He reports intermittent, severe pain to the right flank along with nausea. He denies any fevers or chills. He denies any recent bowel or bladder changes. He has a history of a kidney stone which was removed with lithotripsy. This was approximately 14 years ago.   The history is provided by the patient.  Flank Pain  This is a new problem. Episode onset: This morning. The problem occurs constantly. The problem has been rapidly worsening. Pertinent negatives include no shortness of breath. Nothing aggravates the symptoms. Nothing relieves the symptoms. He has tried nothing for the symptoms.    Past Medical History:  Diagnosis Date  . Barrett esophagus   . Dry eye   . Heartburn 01/11/2014  . History of radiation therapy    of lymphoma on skin: face and leg  . Hydrocele   . Hyperplastic colon polyp 01/12/2014  . Hypertension   . Macular degeneration   . Non Hodgkin's lymphoma ([place]) 2006   right forearm skin, left side of face and right leg  . Renal stone     Patient Active Problem List   Diagnosis Date Noted  . 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 COLONOSCOPY  01/12/2014  . ESOPHAGOGASTRODUODENOSCOPY  01/12/2014  . HYDROCELE EXCISION    . KIDNEY STONE SURGERY         Home Medications    Prior to Admission medications   Medication Sig Start Date End Date Taking? Authorizing Provider  aspirin 81 MG chewable  tablet Chew 81 mg by mouth daily.    [provider]  Azelastine HCl 0.15 % SOLN APPLY 1 SPRAY IN EACH NOSTRIL TWICE A DAY NASALLY 30 DAY(S) 10/28/16   [provider]  ciclopirox (LOPROX) 0.77 % cream Apply 1 application topically once a week.    [provider]  Eyelid Cleansers (AVENOVA) 0.01 % SOLN Apply 1 Dose topically 2 (two) times daily.    [provider]  Fish Oil-Cholecalciferol (OMEGA-3 + VITAMIN D3 PO) Take 4 capsules by mouth daily.    [provider]  Liniments (BLUE-EMU SUPER STRENGTH) CREA Apply 1 application topically once.    [provider]  Multiple Vitamins-Minerals (PRESERVISION/LUTEIN PO) Take 2 capsules by mouth daily.    [provider]  naproxen sodium (ANAPROX) 220 MG tablet Take 220 mg by mouth 2 (two) times daily as needed (pain). Reported on 01/03/2016    [provider]  omeprazole (PRILOSEC) 20 MG capsule Take 20 mg by mouth daily.    [provider]  Polyethyl Glycol-Propyl Glycol (SYSTANE OP) Apply 1 drop to eye 3 (three) times daily. Each eye    [provider]  selenium sulfide (SELSUN) 2.5 % shampoo Apply 1 application topically daily as needed for irritation.    [provider]  valsartan-hydrochlorothiazide (DIOVAN-HCT) 160-12.5 MG tablet Take 1 tablet by mouth daily.    [provider]  Family History Family History  Problem Relation Age of Onset  . Diabetes Father   . Heart murmur Mother   . Kidney disease Mother   . Aneurysm Mother   . Diabetes Brother     Social History Social History  Substance Use Topics  . Smoking status: Former Smoker    Packs/day: 2.00    Years: 22.00    Types: Cigarettes    Quit date: 08/22/1989  . Smokeless tobacco: Never Used     Comment: smoked x 22 yrs.  . Alcohol use 0.0 oz/week     Comment: glass of wine a day and martini on the weekend.     Allergies   Patient has no known allergies.   Review of  Systems Review of Systems  Respiratory: Negative for shortness of breath.   Genitourinary: Positive for flank pain.  All other systems reviewed and are negative.    Physical Exam Updated Vital Signs BP (!) 142/91 (BP Location: Right Arm)   Pulse 66   Temp 98.2 F (36.8 C) (Oral)   Resp 17   Ht 6\' 2"  (1.88 m)   Wt 117.9 kg (260 lb)   SpO2 98%   BMI 33.38 kg/m   Physical Exam  Constitutional: He is oriented to person, place, and time. He appears well-developed and well-nourished. No distress.  HENT:  Head: Normocephalic and atraumatic.  Mouth/Throat: Oropharynx is clear and moist.  Neck: Normal range of motion. Neck supple.  Cardiovascular: Normal rate and regular rhythm.  Exam reveals no friction rub.   No murmur heard. Pulmonary/Chest: Effort normal and breath sounds normal. No respiratory distress. He has no wheezes. He has no rales.  Abdominal: Soft. Bowel sounds are normal. He exhibits no distension. There is no tenderness.  There is mild right-sided CVA tenderness noted. Abdomen is otherwise benign.  Musculoskeletal: Normal range of motion. He exhibits no edema.  Neurological: He is alert and oriented to person, place, and time. Coordination normal.  Skin: Skin is warm and dry. He is not diaphoretic.  Nursing note and vitals reviewed.    ED Treatments / Results  Labs (all labs ordered are listed, but only abnormal results are displayed) Labs Reviewed  URINALYSIS, ROUTINE W REFLEX MICROSCOPIC  BASIC METABOLIC PANEL  CBC WITH DIFFERENTIAL/PLATELET    EKG  EKG Interpretation None       Radiology No results found.  Procedures Procedures (including critical care time)  Medications Ordered in ED Medications  ondansetron (ZOFRAN) injection 4 mg (not administered)  ketorolac (TORADOL) 30 MG/ML injection 30 mg (not administered)     Initial Impression / Assessment and Plan / ED Course  I have reviewed the triage vital signs and the nursing  notes.  Pertinent labs & imaging results that were available during my care of the patient were reviewed by me and considered in my medical decision making (see chart for details).  Patient is a 66 year old male presenting with right flank pain. His CT scan shows a 4 mm and 6 mm calculus in the distal ureter. He is feeling better with Toradol. He will be discharged with pain medicine and is to follow-up with urology if he is not improving in the next 2-3 days.  Final Clinical Impressions(s) / ED Diagnoses   Final diagnoses:  None    New Prescriptions New Prescriptions   No medications on file     Veryl Speak, MD 05/11/17 1056

## 2017-05-11 NOTE — Discharge Instructions (Signed)
Hydrocodone as prescribed as needed for pain.  Follow-up with urology if your symptoms are not improving in the next 2-3 days. The contact information for [facility] has been provided in this discharge summary for you to call and make these arrangements.  Return to the emergency department if you develop worsening pain, high fevers, or other new and concerning symptoms.

## 2017-05-11 NOTE — ED Triage Notes (Signed)
Pt sts right lower back to flank pain starting this am; pt sts hx of kidney stone and feels same; pt sts long drive yesterday and did not drink as much as usual

## 2017-06-14 ENCOUNTER — Telehealth: Payer: Self-pay | Admitting: Internal Medicine

## 2017-06-14 NOTE — Telephone Encounter (Signed)
Patient called to [facility] Lab/MD appt to 06/28/17.

## 2017-06-15 DIAGNOSIS — H2513 Age-related nuclear cataract, bilateral: Secondary | ICD-10-CM | POA: Diagnosis not present

## 2017-06-15 DIAGNOSIS — H5203 Hypermetropia, bilateral: Secondary | ICD-10-CM | POA: Diagnosis not present

## 2017-06-16 ENCOUNTER — Ambulatory Visit: Payer: Medicare Other | Admitting: Internal Medicine

## 2017-06-16 ENCOUNTER — Other Ambulatory Visit: Payer: Medicare Other

## 2017-06-28 ENCOUNTER — Inpatient Hospital Stay: Payer: Medicare Other

## 2017-06-28 ENCOUNTER — Inpatient Hospital Stay: Payer: Medicare Other | Attending: Internal Medicine | Admitting: Internal Medicine

## 2017-06-28 VITALS — BP 145/86 | HR 99 | Temp 98.6°F | Resp 16 | Wt 259.2 lb

## 2017-06-28 DIAGNOSIS — Z8572 Personal history of non-Hodgkin lymphomas: Secondary | ICD-10-CM | POA: Diagnosis not present

## 2017-06-28 DIAGNOSIS — Z7982 Long term (current) use of aspirin: Secondary | ICD-10-CM | POA: Insufficient documentation

## 2017-06-28 DIAGNOSIS — Z87891 Personal history of nicotine dependence: Secondary | ICD-10-CM | POA: Insufficient documentation

## 2017-06-28 DIAGNOSIS — H3530 Unspecified macular degeneration: Secondary | ICD-10-CM | POA: Insufficient documentation

## 2017-06-28 DIAGNOSIS — I10 Essential (primary) hypertension: Secondary | ICD-10-CM | POA: Diagnosis not present

## 2017-06-28 DIAGNOSIS — C884 Extranodal marginal zone B-cell lymphoma of mucosa-associated lymphoid tissue [MALT-lymphoma]: Secondary | ICD-10-CM

## 2017-06-28 LAB — CBC WITH DIFFERENTIAL/PLATELET
Basophils Absolute: 0 10*3/uL (ref 0–0.1)
Basophils Relative: 0 %
Eosinophils Absolute: 0.3 10*3/uL (ref 0–0.7)
Eosinophils Relative: 5 %
HCT: 47.1 % (ref 40.0–52.0)
Hemoglobin: 15.9 g/dL (ref 13.0–18.0)
Lymphocytes Relative: 21 %
Lymphs Abs: 1.2 10*3/uL (ref 1.0–3.6)
MCH: 28.6 pg (ref 26.0–34.0)
MCHC: 33.8 g/dL (ref 32.0–36.0)
MCV: 84.4 fL (ref 80.0–100.0)
Monocytes Absolute: 0.7 10*3/uL (ref 0.2–1.0)
Monocytes Relative: 12 %
Neutro Abs: 3.6 10*3/uL (ref 1.4–6.5)
Neutrophils Relative %: 62 %
Platelets: 266 10*3/uL (ref 150–440)
RBC: 5.58 MIL/uL (ref 4.40–5.90)
RDW: 13 % (ref 11.5–14.5)
WBC: 5.7 10*3/uL (ref 3.8–10.6)

## 2017-06-28 LAB — COMPREHENSIVE METABOLIC PANEL
ALT: 31 U/L (ref 17–63)
AST: 29 U/L (ref 15–41)
Albumin: 4.2 g/dL (ref 3.5–5.0)
Alkaline Phosphatase: 79 U/L (ref 38–126)
Anion gap: 11 (ref 5–15)
BUN: 18 mg/dL (ref 6–20)
CO2: 29 mmol/L (ref 22–32)
Calcium: 9.4 mg/dL (ref 8.9–10.3)
Chloride: 98 mmol/L — ABNORMAL LOW (ref 101–111)
Creatinine, Ser: 0.84 mg/dL (ref 0.61–1.24)
GFR calc Af Amer: >60 mL/min (ref >60)
GFR calc non Af Amer: >60 mL/min (ref >60)
Glucose, Bld: 108 mg/dL — ABNORMAL HIGH (ref 65–99)
Potassium: 4.1 mmol/L (ref 3.5–5.1)
Sodium: 138 mmol/L (ref 135–145)
Total Bilirubin: 1.2 mg/dL (ref 0.3–1.2)
Total Protein: 7.4 g/dL (ref 6.5–8.1)

## 2017-06-28 LAB — LACTATE DEHYDROGENASE: LDH: 145 U/L (ref 98–192)

## 2017-06-28 MED ORDER — TAMSULOSIN HCL 0.4 MG PO CAPS: 0.4000 mg | ORAL_CAPSULE | Freq: Every day | ORAL | 0 refills | Status: DC

## 2017-06-28 NOTE — Progress Notes (Signed)
[facility] NOTE  Patient Care Team: Wenda Low, MD as PCP - General (Internal Medicine) Druscilla Brownie, MD as Consulting Physician (Dermatology)  CHIEF COMPLAINTS/PURPOSE OF CONSULTATION:   Oncology History   # CUTANEOUS LYMPHOMA- [face & Right thigh s/p RT 2006; Dr.Gold; [place]]; 2012-Right Fore Arm knot [s/p Bx- May 2015 nodular dense lymphocytic infiltrate s/o Immunocytoma]  # April 2017 CUTANEOUS MARGINAL ZONE B CELL LYMPHOMA. [Dr.Lupton; [place]] RIGHT FOREARM s/p punch Biopsy; s/p RT [facility] 2017. 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  male with above history of cutaneous marginal low-grade lymphoma right forearm at [facility] in April 2017; status post radiation in May 2017.  He complains of an area of dry skin on his right temple underlying his glasses. No new lumps or bumps though. He is followed by dermatology on a regular basis and reports that derm 'didn't think anything of it'. Denies any pain, bleeding. Appetite is good. No weight loss, night sweats or fevers. He recently returned from a 50 year wedding anniversary trip to [place]. Dermatology and radiation has cleared him for 1 year follow-ups.   He was seen in the Emergency Room on 05/11/17 for kidney stones. He was to follow up with urology if complications but was unable to get an appointment until late this month. He continues to have significant pain in the right flank which is controlled by Norco pain medication. He rates the pain 7/10, dull, non-radiating, and intermittent. He has increased his fluid intake but has not passed the stones to his knowledge. He has had blood in his urine for many years. Urine output has increased with increase in oral intake.    ROS: A complete 10 point review of system is done which is  negative except mentioned above in history of present illness  MEDICAL HISTORY:  Past Medical History:  Diagnosis Date  . Barrett esophagus   . Dry eye   . Heartburn 01/11/2014  . History of radiation therapy    of lymphoma on skin: face and leg  . Hydrocele   . Hyperplastic colon polyp 01/12/2014  . Hypertension   . Macular degeneration   . Non Hodgkin's lymphoma ([place]) 2006   right forearm skin, left side of face and right leg  . Renal stone     SURGICAL HISTORY: Past Surgical History:  Procedure Laterality Date  . COLONOSCOPY  01/12/2014  . ESOPHAGOGASTRODUODENOSCOPY  01/12/2014  . HYDROCELE EXCISION    . KIDNEY STONE SURGERY      SOCIAL HISTORY: moved from [place].  Social History   Social History  . Marital status: Married    Spouse name: N/A  . Number of children: N/A  . Years of education: N/A   Occupational History  . Not on file.   Social History Main Topics  . Smoking status: Former Smoker    Packs/day: 2.00    Years: 22.00    Types: Cigarettes    Quit date: 08/22/1989  . Smokeless tobacco: Never Used     Comment: smoked x 22 yrs.  . Alcohol use 0.0 oz/week     Comment: glass of wine a day and martini on the weekend.  . Drug use: No  . Sexual activity: Not on file   Other Topics Concern  .  Not on file   Social History Narrative  . No narrative on file    FAMILY HISTORY: Family History  Problem Relation Age of Onset  . Diabetes Father   . Heart murmur Mother   . Kidney disease Mother   . Aneurysm Mother   . Diabetes Brother     ALLERGIES:  has No Known Allergies.  MEDICATIONS:  Current Outpatient Prescriptions  Medication Sig Dispense Refill  . aspirin 81 MG chewable tablet Chew 81 mg by mouth daily.    . Azelastine HCl 0.15 % SOLN APPLY 1 SPRAY IN EACH NOSTRIL TWICE A DAY NASALLY 30 DAY(S)  6  . ciclopirox (LOPROX) 0.77 % cream Apply 1 application topically once a week.    . Eyelid Cleansers (AVENOVA) 0.01 % SOLN Apply 1 Dose  topically 2 (two) times daily.    . Fish Oil-Cholecalciferol (OMEGA-3 + VITAMIN D3 PO) Take 4 capsules by mouth daily.    . Liniments (BLUE-EMU SUPER STRENGTH) CREA Apply 1 application topically once.    . Multiple Vitamins-Minerals (PRESERVISION/LUTEIN PO) Take 2 capsules by mouth daily.    . naproxen sodium (ANAPROX) 220 MG tablet Take 220 mg by mouth 2 (two) times daily as needed (pain). Reported on 01/03/2016    . omeprazole (PRILOSEC) 20 MG capsule Take 20 mg by mouth daily.    Vladimir Faster Glycol-Propyl Glycol (SYSTANE OP) Apply 1 drop to eye 3 (three) times daily. Each eye    . rosuvastatin (CRESTOR) 5 MG tablet Take 5 mg by mouth daily.  2  . selenium sulfide (SELSUN) 2.5 % shampoo Apply 1 application topically daily as needed for irritation.    . valsartan-hydrochlorothiazide (DIOVAN-HCT) 160-12.5 MG tablet Take 1 tablet by mouth daily.    Marland Kitchen HYDROcodone-acetaminophen (NORCO) 5-325 MG tablet Take 1-2 tablets by mouth every 6 (six) hours as needed. (Patient not taking: Reported on 06/28/2017) 20 tablet 0  . tamsulosin (FLOMAX) 0.4 MG CAPS capsule Take 1 capsule (0.4 mg total) by mouth at bedtime. 30 capsule 0   No current facility-administered medications for this visit.    Marland Kitchen PHYSICAL EXAMINATION: ECOG PERFORMANCE STATUS: 0 - Asymptomatic  Vitals:   06/28/17 1048  BP: (!) 145/86  Pulse: 99  Resp: 16  Temp: 98.6 F (37 C)   Filed Weights   06/28/17 1048  Weight: 259 lb 3.2 oz (117.6 kg)    GENERAL: Well-nourished well-developed; Alert, no distress and comfortable.  He is alone.   EYES: no pallor or icterus OROPHARYNX: no thrush or ulceration; good dentition  NECK: supple, no masses felt LYMPH:  no palpable lymphadenopathy in the cervical, axillary or inguinal regions LUNGS: clear to auscultation and  No wheeze or crackles HEART/CVS: regular rate & rhythm and no murmurs; No lower extremity edema ABDOMEN: abdomen soft, non-tender and normal bowel sounds Musculoskeletal:no  cyanosis of digits and no clubbing  PSYCH: alert & oriented x 3 with fluent speech NEURO: no focal motor/sensory deficits  SKIN: 0.5 x 0.5cm nodular lesion noted on the right distal forearm [site of excision]  LABORATORY DATA:  I have reviewed the data as listed Lab Results  Component Value Date   WBC 5.7 06/28/2017   HGB 15.9 06/28/2017   HCT 47.1 06/28/2017   MCV 84.4 06/28/2017   PLT 266 06/28/2017    Recent Labs  12/14/16 1415 05/11/17 1000 06/28/17 1022  NA 137 138 138  K 3.9 3.9 4.1  CL 103 105 98*  CO2 31 25 29   GLUCOSE 113* 106*  108*  BUN 14 17 18   CREATININE 0.76 0.85 0.84  CALCIUM 9.0 9.2 9.4  GFRNONAA >60 >60 >60  GFRAA >60 >60 >60  PROT 7.5  --  7.4  ALBUMIN 4.2  --  4.2  AST 30  --  29  ALT 44  --  31  ALKPHOS 106  --  79  BILITOT 0.8  --  1.2     ASSESSMENT & PLAN:   Primary cutaneous marginal zone B-cell lymphoma (H[facility]) # Right forearm nodular lesion-Status post punch Biopsy- late April 2017 [Dr.lupton; [place]]- CUTANEOUS MARGINAL ZONE B CELL LYMPHOMA. S/p RT May 2017.    # Clinically no evidence of recurrence.  #Right renal calculi-  Creatinine normal today, no change in UO. Will need to ensure resolution of hematuria after kidney stone resolves. Will start flomax and encourage f/u with urology.  # follow up in 1 year with labwork    Beckey Rutter, DNP, AGNP-C 06/28/17 11:36 AM    Verlon Au, NP 06/28/2017 11:34 AM

## 2017-06-28 NOTE — Assessment & Plan Note (Addendum)
#   Right forearm nodular lesion-Status post punch Biopsy- late April 2017 [Dr.lupton; [place]]- CUTANEOUS MARGINAL ZONE B CELL LYMPHOMA. S/p RT May 2017.    # Clinically no evidence of recurrence.  #Right renal calculi-  Creatinine normal today, no change in UO. Will need to ensure resolution of hematuria after kidney stone resolves. Will start flomax and encourage f/u with urology.  # follow up in 1 year with labwork

## 2017-06-29 DIAGNOSIS — Z23 Encounter for immunization: Secondary | ICD-10-CM | POA: Diagnosis not present

## 2017-07-15 DIAGNOSIS — N202 Calculus of kidney with calculus of ureter: Secondary | ICD-10-CM | POA: Diagnosis not present

## 2017-07-15 DIAGNOSIS — N281 Cyst of kidney, acquired: Secondary | ICD-10-CM | POA: Diagnosis not present

## 2017-07-20 ENCOUNTER — Other Ambulatory Visit: Payer: Self-pay | Admitting: Urology

## 2017-07-26 ENCOUNTER — Other Ambulatory Visit: Payer: Self-pay | Admitting: Internal Medicine

## 2017-07-26 NOTE — Telephone Encounter (Signed)
Patient was started on Tamsulosin on 06/28/17 waiting to see Urology. Do you want to refill this or let urology handle?

## 2017-07-29 ENCOUNTER — Other Ambulatory Visit: Payer: Self-pay

## 2017-07-29 ENCOUNTER — Encounter (HOSPITAL_BASED_OUTPATIENT_CLINIC_OR_DEPARTMENT_OTHER): Payer: Self-pay | Admitting: *Deleted

## 2017-07-29 NOTE — Progress Notes (Signed)
NPO AFTER MN.  ARRIVE AT 0630.  NEEDS ISTAT 8 AND EKG.  MAY TAKE HYDROCODONE AM DOS W/ SIPS OF WATER IF NEEDED.

## 2017-08-03 ENCOUNTER — Other Ambulatory Visit: Payer: Self-pay | Admitting: *Deleted

## 2017-08-03 NOTE — H&P (Signed)
Urology Preoperative H&P   Chief Complaint: Right flank pain, bilateral kidney stones  History of Present Illness: Michael Huffman is a 66 y.o. male with a long history of nephrolithiasis.  He had a CT stone study on 05/11/2017 secondary to right flank pain that demonstrated 2 right distal ureteral stones measuring approximately 4 and 6 mm piece with mild hydronephrosis as well as a 4 mm left renal stone.  Currently, his flank pain is controlled and he denies nausea/vomiting, fever/chills or hematuria.    Past Medical History:  Diagnosis Date  . Chronically dry eyes, bilateral   . GERD (gastroesophageal reflux disease)   . Hiatal hernia   . History of colon polyps 01/12/2013   hyperplastic  . History of external beam radiation therapy    face and right thigh , completed 2006 /  right forearm ,  4000 Gy,  completed 06/ 2017  . History of kidney stones   . History of radiation therapy    of lymphoma on skin: face and leg  . Hyperlipidemia   . Hypertension   . 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 Nephrolithiasis    bilateral   . OSA on CPAP   . Primary cutaneous lymphoma [facility]) oncologist-  dr Charlaine Dalton (cancer center [place])--- per lov note 10--04-2017 -- no recurrence clinically   dx 2006 -- face and right thigh cutaneous lymphoma s/p radiation therapy (done in [place]);  2012 right forearm knot s/p bx- may 2015 nodular lymphocytic infiltrate s/o immunocytoma;  04/ 2017  cutaneous marginal zone B Cell lymphoma , right forearm (punch bx [id number])  s/p  radiation therapy 06/ 2017  . SSBE (short-segment Barrett's esophagus)     Past Surgical History:  Procedure Laterality Date  . COLONOSCOPY WITH ESOPHAGOGASTRODUODENOSCOPY (EGD)  01/12/2014   in [place]  . EXTRACORPOREAL SHOCK WAVE LITHOTRIPSY  1990s  . HYDROCELE EXCISION Left 2000  approx.  . TONSILLECTOMY  child    Allergies: No Known Allergies  Family History  Problem Relation Age of Onset  . Diabetes Father    . Heart murmur Mother   . Kidney disease Mother   . Aneurysm Mother   . Diabetes Brother     Social History:  reports that he quit smoking about 27 years ago. His smoking use included cigarettes. He has a 44.00 pack-year smoking history. he has never used smokeless tobacco. He reports that he drinks about 4.8 oz of alcohol per week. He reports that he does not use drugs.  ROS: A complete review of systems was performed.  All systems are negative except for pertinent findings as noted.  Physical Exam:  Vital signs in last 24 hours:   Constitutional:  Alert and oriented, No acute distress Cardiovascular: Regular rate and rhythm, No JVD Respiratory: Normal respiratory effort, Lungs clear bilaterally GI: Abdomen is soft, nontender, nondistended, no abdominal masses GU: No CVA tenderness Lymphatic: No lymphadenopathy Neurologic: Grossly intact, no focal deficits Psychiatric: Normal mood and affect  Laboratory Data:  No results for input(s): WBC, HGB, HCT, PLT in the last 72 hours.  No results for input(s): NA, K, CL, GLUCOSE, BUN, CALCIUM, CREATININE in the last 72 hours.  Invalid input(s): CO3   No results found for this or any previous visit (from the past 24 hour(s)). No results found for this or any previous visit (from the past 240 hour(s)).  Renal Function: No results for input(s): CREATININE in the last 168 hours. CrCl cannot be calculated (Patient's  most recent lab result is older than the maximum 21 days allowed.).  Radiologic Imaging: No results found.  I independently reviewed the above imaging studies.  Assessment and Plan Michael Huffman is a 66 y.o. male with 2 distal right ureteral stones with mild hydronephrosis and a 4 mm nonobstructing left renal stone  -The risks, benefits and alternatives of cystoscopy with bilateral retrograde pyelograms, bilateral ureteroscopy, holmium laser lithotripsy and bilateral JJ stent placement was discussed with the patient.  He  voices understanding and wishes to proceed.   Ellison Hughs, MD 08/03/2017, 8:30 AM  [facility] Pager: [phone]

## 2017-08-03 NOTE — Telephone Encounter (Signed)
Requesting refill of Tamsulosin.  Please advise  ASSESSMENT & PLAN:   Primary cutaneous marginal zone B-cell lymphoma ([facility]) # Right forearm nodular lesion-Status post punch Biopsy- late April 2017 [Dr.lupton; [place]]- CUTANEOUS MARGINAL ZONE B CELL LYMPHOMA. S/p RT May 2017.    # Clinically no evidence of recurrence.  #Right renal calculi-  Creatinine normal today, no change in UO. Will need to ensure resolution of hematuria after kidney stone resolves. Will start flomax and encourage f/u with urology.  # follow up in 1 year with labwork    Beckey Rutter, DNP, AGNP-C 06/28/17 11:36 AM    Verlon Au, NP 06/28/2017 11:34 AM

## 2017-08-03 NOTE — Telephone Encounter (Signed)
It is okay to refill-Flomax 30 pills; no refills.  Moving forward he should talk to urology/PCP for further refills. Thx

## 2017-08-03 NOTE — Anesthesia Preprocedure Evaluation (Addendum)
Anesthesia Evaluation  Patient identified by MRN, date of birth, ID band Patient awake    Reviewed: Allergy & Precautions, NPO status , Patient's Chart, lab work & pertinent test results  History of Anesthesia Complications Negative for: history of anesthetic complications  Airway Mallampati: II  TM Distance: >3 FB Neck ROM: Full    Dental  (+) Dental Advisory Given, Caps   Pulmonary sleep apnea and Continuous Positive Airway Pressure Ventilation , former smoker,    breath sounds clear to auscultation       Cardiovascular hypertension, Pt. on medications (-) angina Rhythm:Regular Rate:Normal     Neuro/Psych negative neurological ROS     GI/Hepatic Neg liver ROS, GERD  Medicated and Controlled,  Endo/Other  Morbid obesity  Renal/GU nephrolithiasis     Musculoskeletal   Abdominal (+) + obese,   Peds  Hematology H/o lymphoma: XRT   Anesthesia Other Findings   Reproductive/Obstetrics                            Anesthesia Physical Anesthesia Plan  ASA: II  Anesthesia Plan: General   Post-op Pain Management:    Induction: Intravenous  PONV Risk Score and Plan: 2 and Ondansetron, Dexamethasone and Midazolam  Airway Management Planned: LMA  Additional Equipment:   Intra-op Plan:   Post-operative Plan:   Informed Consent: I have reviewed the patients History and Physical, chart, labs and discussed the procedure including the risks, benefits and alternatives for the proposed anesthesia with the patient or authorized representative who has indicated his/her understanding and acceptance.   Dental advisory given  Plan Discussed with: CRNA and Surgeon  Anesthesia Plan Comments: (Plan routine monitors, GA- LMA OK)        Anesthesia Quick Evaluation

## 2017-08-04 ENCOUNTER — Encounter (HOSPITAL_BASED_OUTPATIENT_CLINIC_OR_DEPARTMENT_OTHER): Payer: Self-pay | Admitting: *Deleted

## 2017-08-04 ENCOUNTER — Ambulatory Visit (HOSPITAL_BASED_OUTPATIENT_CLINIC_OR_DEPARTMENT_OTHER)
Admission: RE | Admit: 2017-08-04 | Discharge: 2017-08-04 | Disposition: A | Discharge status: Home / Self Care | Payer: Medicare Other | Source: Ambulatory Visit | Attending: Urology | Admitting: Urology

## 2017-08-04 ENCOUNTER — Ambulatory Visit (HOSPITAL_BASED_OUTPATIENT_CLINIC_OR_DEPARTMENT_OTHER): Payer: Medicare Other | Admitting: Anesthesiology

## 2017-08-04 ENCOUNTER — Other Ambulatory Visit: Payer: Self-pay

## 2017-08-04 ENCOUNTER — Encounter (HOSPITAL_BASED_OUTPATIENT_CLINIC_OR_DEPARTMENT_OTHER)
Admission: RE | Disposition: A | Discharge status: Home / Self Care | Payer: Self-pay | Source: Ambulatory Visit | Attending: Urology

## 2017-08-04 DIAGNOSIS — H3530 Unspecified macular degeneration: Secondary | ICD-10-CM | POA: Diagnosis not present

## 2017-08-04 DIAGNOSIS — Z7982 Long term (current) use of aspirin: Secondary | ICD-10-CM | POA: Insufficient documentation

## 2017-08-04 DIAGNOSIS — N202 Calculus of kidney with calculus of ureter: Secondary | ICD-10-CM | POA: Diagnosis not present

## 2017-08-04 DIAGNOSIS — G4733 Obstructive sleep apnea (adult) (pediatric): Secondary | ICD-10-CM | POA: Insufficient documentation

## 2017-08-04 DIAGNOSIS — E785 Hyperlipidemia, unspecified: Secondary | ICD-10-CM | POA: Diagnosis not present

## 2017-08-04 DIAGNOSIS — Z6834 Body mass index (BMI) 34.0-34.9, adult: Secondary | ICD-10-CM | POA: Insufficient documentation

## 2017-08-04 DIAGNOSIS — Z87891 Personal history of nicotine dependence: Secondary | ICD-10-CM | POA: Diagnosis not present

## 2017-08-04 DIAGNOSIS — Z8572 Personal history of non-Hodgkin lymphomas: Secondary | ICD-10-CM | POA: Insufficient documentation

## 2017-08-04 DIAGNOSIS — Z9989 Dependence on other enabling machines and devices: Secondary | ICD-10-CM | POA: Diagnosis not present

## 2017-08-04 DIAGNOSIS — N132 Hydronephrosis with renal and ureteral calculous obstruction: Secondary | ICD-10-CM | POA: Insufficient documentation

## 2017-08-04 DIAGNOSIS — I10 Essential (primary) hypertension: Secondary | ICD-10-CM | POA: Insufficient documentation

## 2017-08-04 DIAGNOSIS — Z87442 Personal history of urinary calculi: Secondary | ICD-10-CM | POA: Diagnosis not present

## 2017-08-04 DIAGNOSIS — Z923 Personal history of irradiation: Secondary | ICD-10-CM | POA: Diagnosis not present

## 2017-08-04 DIAGNOSIS — H04123 Dry eye syndrome of bilateral lacrimal glands: Secondary | ICD-10-CM | POA: Diagnosis not present

## 2017-08-04 DIAGNOSIS — K2270 Barrett's esophagus without dysplasia: Secondary | ICD-10-CM | POA: Diagnosis not present

## 2017-08-04 DIAGNOSIS — K219 Gastro-esophageal reflux disease without esophagitis: Secondary | ICD-10-CM | POA: Insufficient documentation

## 2017-08-04 DIAGNOSIS — Z86010 Personal history of colonic polyps: Secondary | ICD-10-CM | POA: Diagnosis not present

## 2017-08-04 DIAGNOSIS — Z79899 Other long term (current) drug therapy: Secondary | ICD-10-CM | POA: Diagnosis not present

## 2017-08-04 DIAGNOSIS — N200 Calculus of kidney: Secondary | ICD-10-CM | POA: Diagnosis not present

## 2017-08-04 DIAGNOSIS — G4730 Sleep apnea, unspecified: Secondary | ICD-10-CM | POA: Diagnosis not present

## 2017-08-04 HISTORY — DX: Barrett's esophagus without dysplasia: K22.70

## 2017-08-04 HISTORY — PX: CYSTOSCOPY/URETEROSCOPY/HOLMIUM LASER/STENT PLACEMENT: SHX6546

## 2017-08-04 HISTORY — DX: Diaphragmatic hernia without obstruction or gangrene: K44.9

## 2017-08-04 HISTORY — DX: Personal history of urinary calculi: Z87.442

## 2017-08-04 HISTORY — DX: Gastro-esophageal reflux disease without esophagitis: K21.9

## 2017-08-04 HISTORY — DX: Hyperlipidemia, unspecified: E78.5

## 2017-08-04 HISTORY — DX: Dependence on other enabling machines and devices: Z99.89

## 2017-08-04 HISTORY — DX: Calculus of kidney: N20.0

## 2017-08-04 HISTORY — DX: Obstructive sleep apnea (adult) (pediatric): G47.33

## 2017-08-04 HISTORY — DX: Dry eye syndrome of bilateral lacrimal glands: H04.123

## 2017-08-04 HISTORY — DX: Personal history of irradiation: Z92.3

## 2017-08-04 HISTORY — DX: Personal history of colonic polyps: Z86.010

## 2017-08-04 HISTORY — DX: Non-Hodgkin lymphoma, unspecified, extranodal and solid organ sites: C85.99

## 2017-08-04 LAB — POCT I-STAT, CHEM 8
BUN: 12 mg/dL (ref 6–20)
Calcium, Ion: 1.22 mmol/L (ref 1.15–1.40)
Chloride: 100 mmol/L — ABNORMAL LOW (ref 101–111)
Creatinine, Ser: 0.7 mg/dL (ref 0.61–1.24)
Glucose, Bld: 113 mg/dL — ABNORMAL HIGH (ref 65–99)
HCT: 44 % (ref 39.0–52.0)
Hemoglobin: 15 g/dL (ref 13.0–17.0)
Potassium: 3.9 mmol/L (ref 3.5–5.1)
Sodium: 141 mmol/L (ref 135–145)
TCO2: 27 mmol/L (ref 22–32)

## 2017-08-04 SURGERY — CYSTOSCOPY/URETEROSCOPY/HOLMIUM LASER/STENT PLACEMENT
Anesthesia: General | Site: Ureter | Laterality: Bilateral

## 2017-08-04 MED ORDER — FENTANYL CITRATE (PF) 100 MCG/2ML IJ SOLN
INTRAMUSCULAR | Status: AC
Start: 2017-08-04 — End: 2017-08-04
  Filled 2017-08-04: qty 2

## 2017-08-04 MED ORDER — MIDAZOLAM HCL 5 MG/5ML IJ SOLN
INTRAMUSCULAR | Status: DC | PRN
  Administered 2017-08-04: 2 mg via INTRAVENOUS

## 2017-08-04 MED ORDER — HYDROCODONE-ACETAMINOPHEN 5-325 MG PO TABS
ORAL_TABLET | ORAL | Status: AC
  Filled 2017-08-04: qty 1

## 2017-08-04 MED ORDER — DEXAMETHASONE SODIUM PHOSPHATE 4 MG/ML IJ SOLN
INTRAMUSCULAR | Status: DC | PRN
  Administered 2017-08-04: 10 mg via INTRAVENOUS

## 2017-08-04 MED ORDER — LIDOCAINE 2% (20 MG/ML) 5 ML SYRINGE
INTRAMUSCULAR | Status: AC
  Filled 2017-08-04: qty 5

## 2017-08-04 MED ORDER — MIDAZOLAM HCL 2 MG/2ML IJ SOLN
INTRAMUSCULAR | Status: AC
  Filled 2017-08-04: qty 2

## 2017-08-04 MED ORDER — IOHEXOL 300 MG/ML  SOLN
INTRAMUSCULAR | Status: DC | PRN
  Administered 2017-08-04: 5 mL

## 2017-08-04 MED ORDER — HYDROCODONE-ACETAMINOPHEN 5-325 MG PO TABS
1.0000 | ORAL_TABLET | Freq: Four times a day (QID) | ORAL | Status: DC | PRN
  Administered 2017-08-04: 1 via ORAL
  Filled 2017-08-04: qty 1

## 2017-08-04 MED ORDER — ONDANSETRON HCL 4 MG/2ML IJ SOLN
INTRAMUSCULAR | Status: DC | PRN
  Administered 2017-08-04: 4 mg via INTRAVENOUS

## 2017-08-04 MED ORDER — DEXAMETHASONE SODIUM PHOSPHATE 10 MG/ML IJ SOLN
INTRAMUSCULAR | Status: AC
  Filled 2017-08-04: qty 1

## 2017-08-04 MED ORDER — FENTANYL CITRATE (PF) 100 MCG/2ML IJ SOLN
25.0000 ug | INTRAMUSCULAR | Status: DC | PRN
  Filled 2017-08-04: qty 1

## 2017-08-04 MED ORDER — DEXTROSE 5 % IV SOLN
3.0000 g | Freq: Once | INTRAVENOUS | Status: AC
  Administered 2017-08-04: 3 g via INTRAVENOUS
  Filled 2017-08-04: qty 3000

## 2017-08-04 MED ORDER — PROPOFOL 10 MG/ML IV BOLUS
INTRAVENOUS | Status: AC
  Filled 2017-08-04: qty 40

## 2017-08-04 MED ORDER — PROPOFOL 10 MG/ML IV BOLUS
INTRAVENOUS | Status: DC | PRN
  Administered 2017-08-04: 50 mg via INTRAVENOUS
  Administered 2017-08-04: 250 mg via INTRAVENOUS

## 2017-08-04 MED ORDER — PROMETHAZINE HCL 25 MG/ML IJ SOLN
6.2500 mg | INTRAMUSCULAR | Status: DC | PRN
  Filled 2017-08-04: qty 1

## 2017-08-04 MED ORDER — ONDANSETRON HCL 4 MG/2ML IJ SOLN
INTRAMUSCULAR | Status: AC
Start: 2017-08-04 — End: 2017-08-04
  Filled 2017-08-04: qty 2

## 2017-08-04 MED ORDER — MIDAZOLAM HCL 2 MG/2ML IJ SOLN
0.5000 mg | Freq: Once | INTRAMUSCULAR | Status: DC | PRN
  Filled 2017-08-04: qty 2

## 2017-08-04 MED ORDER — FENTANYL CITRATE (PF) 100 MCG/2ML IJ SOLN
INTRAMUSCULAR | Status: DC | PRN
  Administered 2017-08-04 (×2): 25 ug via INTRAVENOUS
  Administered 2017-08-04: 50 ug via INTRAVENOUS

## 2017-08-04 MED ORDER — LACTATED RINGERS IV SOLN
INTRAVENOUS | Status: DC
  Administered 2017-08-04 (×2): via INTRAVENOUS
  Filled 2017-08-04: qty 1000

## 2017-08-04 MED ORDER — HYDROCODONE-ACETAMINOPHEN 5-325 MG PO TABS: 1.0000 | ORAL_TABLET | Freq: Four times a day (QID) | ORAL | 0 refills | Status: DC | PRN

## 2017-08-04 MED ORDER — ONDANSETRON HCL 4 MG PO TABS: 4.0000 mg | ORAL_TABLET | Freq: Every day | ORAL | 1 refills | Status: DC | PRN

## 2017-08-04 MED ORDER — PHENAZOPYRIDINE HCL 200 MG PO TABS: 200.0000 mg | ORAL_TABLET | Freq: Three times a day (TID) | ORAL | 0 refills | Status: DC | PRN

## 2017-08-04 MED ORDER — KETOROLAC TROMETHAMINE 30 MG/ML IJ SOLN
INTRAMUSCULAR | Status: AC
  Filled 2017-08-04: qty 1

## 2017-08-04 MED ORDER — SODIUM CHLORIDE 0.9 % IR SOLN
Status: DC | PRN
  Administered 2017-08-04: 4000 mL

## 2017-08-04 MED ORDER — LIDOCAINE 2% (20 MG/ML) 5 ML SYRINGE
INTRAMUSCULAR | Status: DC | PRN
  Administered 2017-08-04: 100 mg via INTRAVENOUS

## 2017-08-04 MED ORDER — MEPERIDINE HCL 25 MG/ML IJ SOLN
6.2500 mg | INTRAMUSCULAR | Status: DC | PRN
  Filled 2017-08-04: qty 1

## 2017-08-04 MED ORDER — SULFAMETHOXAZOLE-TRIMETHOPRIM 800-160 MG PO TABS: 1.0000 | ORAL_TABLET | Freq: Two times a day (BID) | ORAL | 0 refills | Status: AC

## 2017-08-04 MED ORDER — KETOROLAC TROMETHAMINE 30 MG/ML IJ SOLN
INTRAMUSCULAR | Status: DC | PRN
  Administered 2017-08-04: 30 mg via INTRAVENOUS

## 2017-08-04 MED ORDER — TAMSULOSIN HCL 0.4 MG PO CAPS: 0.4000 mg | ORAL_CAPSULE | Freq: Every day | ORAL | 0 refills | Status: DC

## 2017-08-04 MED ORDER — METOCLOPRAMIDE HCL 5 MG/ML IJ SOLN
INTRAMUSCULAR | Status: DC | PRN
  Administered 2017-08-04: 10 mg via INTRAVENOUS

## 2017-08-04 MED ORDER — PHENAZOPYRIDINE HCL 100 MG PO TABS
ORAL_TABLET | ORAL | Status: AC
  Filled 2017-08-04: qty 2

## 2017-08-04 MED ORDER — ARTIFICIAL TEARS OPHTHALMIC OINT
TOPICAL_OINTMENT | OPHTHALMIC | Status: AC
  Filled 2017-08-04: qty 3.5

## 2017-08-04 MED ORDER — CEFAZOLIN SODIUM-DEXTROSE 2-4 GM/100ML-% IV SOLN
2.0000 g | Freq: Once | INTRAVENOUS | Status: DC
  Filled 2017-08-04: qty 100

## 2017-08-04 MED ORDER — PHENAZOPYRIDINE HCL 200 MG PO TABS
200.0000 mg | ORAL_TABLET | Freq: Three times a day (TID) | ORAL | Status: DC
  Administered 2017-08-04: 200 mg via ORAL
  Filled 2017-08-04: qty 1

## 2017-08-04 MED ORDER — METOCLOPRAMIDE HCL 5 MG/ML IJ SOLN
INTRAMUSCULAR | Status: AC
  Filled 2017-08-04: qty 2

## 2017-08-04 SURGICAL SUPPLY — 25 items
BAG DRAIN URO-CYSTO SKYTR STRL (DRAIN) ×2 IMPLANT
BASKET STONE 1.7 NGAGE (UROLOGICAL SUPPLIES) ×2 IMPLANT
BASKET ZERO TIP NITINOL 2.4FR (BASKET) ×2 IMPLANT
BENZOIN TINCTURE PRP APPL 2/3 (GAUZE/BANDAGES/DRESSINGS) IMPLANT
CATH INTERMIT  6FR 70CM (CATHETERS) ×2 IMPLANT
CLOTH BEACON ORANGE TIMEOUT ST (SAFETY) ×2 IMPLANT
FIBER LASER FLEXIVA 365 (UROLOGICAL SUPPLIES) IMPLANT
FIBER LASER TRAC TIP (UROLOGICAL SUPPLIES) ×2 IMPLANT
GLOVE BIO SURGEON STRL SZ7.5 (GLOVE) ×2 IMPLANT
GOWN STRL REUS W/TWL LRG LVL3 (GOWN DISPOSABLE) ×2 IMPLANT
GOWN STRL REUS W/TWL XL LVL3 (GOWN DISPOSABLE) ×4 IMPLANT
GUIDEWIRE ANG ZIPWIRE 038X150 (WIRE) ×2 IMPLANT
GUIDEWIRE STR DUAL SENSOR (WIRE) IMPLANT
INFUSOR MANOMETER BAG 3000ML (MISCELLANEOUS) ×2 IMPLANT
IV NS 1000ML (IV SOLUTION) ×1
IV NS 1000ML BAXH (IV SOLUTION) ×1 IMPLANT
IV NS IRRIG 3000ML ARTHROMATIC (IV SOLUTION) ×2 IMPLANT
KIT RM TURNOVER CYSTO AR (KITS) ×2 IMPLANT
MANIFOLD NEPTUNE II (INSTRUMENTS) ×2 IMPLANT
NS IRRIG 500ML POUR BTL (IV SOLUTION) ×2 IMPLANT
PACK CYSTO (CUSTOM PROCEDURE TRAY) ×2 IMPLANT
STENT URET 6FRX24 CONTOUR (STENTS) ×4 IMPLANT
STRIP CLOSURE SKIN 1/2X4 (GAUZE/BANDAGES/DRESSINGS) IMPLANT
SYRINGE 10CC LL (SYRINGE) ×2 IMPLANT
TUBE CONNECTING 12X1/4 (SUCTIONS) ×2 IMPLANT

## 2017-08-04 NOTE — Transfer of Care (Signed)
  Last Vitals:  Vitals:   08/04/17 0636  BP: (!) 155/86  Pulse: 60  Resp: 18  Temp: 36.7 C  SpO2: 97%    Last Pain:  Vitals:   08/04/17 0636  TempSrc: Oral      Immediate Anesthesia Transfer of Care Note  Patient: Michael Huffman  Procedure(s) Performed: Procedure(s) (LRB): CYSTOSCOPY/ RETROGRADE/URETEROSCOPY/HOLMIUM LASER/STENT PLACEMENT (Bilateral)  Patient Location: PACU  Anesthesia Type: General  Level of Consciousness: awake, alert  and oriented  Airway & Oxygen Therapy: Patient Spontanous Breathing and Patient connected to nasal cannula oxygen  Post-op Assessment: Report given to PACU RN and Post -op Vital signs reviewed and stable  Post vital signs: Reviewed and stable  Complications: No apparent anesthesia complications

## 2017-08-04 NOTE — Discharge Instructions (Signed)
[facility] °[phone] °Post Ureteroscopy With or Without Stent Instructions ° °Definitions: ° °Ureter: The duct that transports urine from the kidney to the bladder. °Stent:   A plastic hollow tube that is placed into the ureter, from the kidney to the                 bladder to prevent the ureter from swelling shut. ° °GENERAL INSTRUCTIONS: ° °Despite the fact that no skin incisions were used, the area around the ureter and bladder is raw and irritated. The stent is a foreign body which will further irritate the bladder wall. This irritation is manifested by increased frequency of urination, both day and night, and by an increase in the urge to urinate. In some, the urge to urinate is present almost always. Sometimes the urge is strong enough that you may not be able to stop yourself from urinating. The only real cure is to remove the stent and then give time for the bladder wall to heal which can't be done until the danger of the ureter swelling shut has passed, which varies. ° °You may see some blood in your urine while the stent is in place and a few days afterwards. Do not be alarmed, even if the urine was clear for a while. Get off your feet and drink lots of fluids until clearing occurs. If you start to pass clots or don't improve, call us. ° °DIET: °You may return to your normal diet immediately. Because of the raw surface of your bladder, alcohol, spicy foods, acid type foods and drinks with caffeine may cause irritation or frequency and should be used in moderation. To keep your urine flowing freely and to avoid constipation, drink plenty of fluids during the day ( 8-10 glasses ). °Tip: Avoid cranberry juice because it is very acidic. ° °ACTIVITY: °Your physical activity doesn't need to be restricted. However, if you are very active, you may see some blood in your urine. We suggest that you reduce your activity under these circumstances until the bleeding has stopped. ° °BOWELS: °It is  important to keep your bowels regular during the postoperative period. Straining with bowel movements can cause bleeding. A bowel movement every other day is reasonable. Use a mild laxative if needed, such as Milk of Magnesia 2-3 tablespoons, or 2 Dulcolax tablets. Call if you continue to have problems. If you have been taking narcotics for pain, before, during or after your surgery, you may be constipated. Take a laxative if necessary. ° ° °MEDICATION: °You should resume your pre-surgery medications unless told not to. In addition you will often be given an antibiotic to prevent infection. These should be taken as prescribed until the bottles are finished unless you are having an unusual reaction to one of the drugs. ° °PROBLEMS YOU SHOULD REPORT TO US: °· Fevers over 100.5 Fahrenheit. °· Heavy bleeding, or clots ( See above notes about blood in urine ). °· Inability to urinate. °· Drug reactions ( hives, rash, nausea, vomiting, diarrhea ). °· Severe burning or pain with urination that is not improving. ° °FOLLOW-UP: °You will need a follow-up appointment to monitor your progress. Call for this appointment at the number listed above. Usually the first appointment will be about three to fourteen days after your surgery. ° ° ° ° ° °Post Anesthesia Home Care Instructions ° °Activity: °Get plenty of rest for the remainder of the day. A responsible individual must stay with you for 24 hours following the procedure.  °  For the next 24 hours, DO NOT: °-Drive a car °-Operate machinery °-Drink alcoholic beverages °-Take any medication unless instructed by your physician °-Make any legal decisions or sign important papers. ° °Meals: °Start with liquid foods such as gelatin or soup. Progress to regular foods as tolerated. Avoid greasy, spicy, heavy foods. If nausea and/or vomiting occur, drink only clear liquids until the nausea and/or vomiting subsides. Call your physician if vomiting continues. ° °Special  Instructions/Symptoms: °Your throat may feel dry or sore from the anesthesia or the breathing tube placed in your throat during surgery. If this causes discomfort, gargle with warm salt water. The discomfort should disappear within 24 hours. ° °If you had a scopolamine patch placed behind your ear for the management of post- operative nausea and/or vomiting: ° °1. The medication in the patch is effective for 72 hours, after which it should be removed.  Wrap patch in a tissue and discard in the trash. Wash hands thoroughly with soap and water. °2. You may remove the patch earlier than 72 hours if you experience unpleasant side effects which may include dry mouth, dizziness or visual disturbances. °3. Avoid touching the patch. Wash your hands with soap and water after contact with the patch. °  ° °

## 2017-08-04 NOTE — Interval H&P Note (Signed)
History and Physical Interval Note:  08/04/2017 8:34 AM  Michael Huffman  has presented today for surgery, with the diagnosis of BILATERAL KIDNEY STONES  The various methods of treatment have been discussed with the patient and family. After consideration of risks, benefits and other options for treatment, the patient has consented to  Procedure(s): CYSTOSCOPY/ RETROGRADE/URETEROSCOPY/HOLMIUM LASER/STENT PLACEMENT (Bilateral) as a surgical intervention .  The patient's history has been reviewed, patient examined, no change in status, stable for surgery.  I have reviewed the patient's chart and labs.  Questions were answered to the patient's satisfaction.     [name]

## 2017-08-04 NOTE — Anesthesia Postprocedure Evaluation (Signed)
Anesthesia Post Note  Patient: Michael Huffman  Procedure(s) Performed: CYSTOSCOPY/ RETROGRADE/URETEROSCOPY/HOLMIUM LASER/STENT PLACEMENT (Bilateral Ureter)     Patient location during evaluation: PACU Anesthesia Type: General Level of consciousness: awake and alert, patient cooperative and oriented Pain management: pain level controlled Vital Signs Assessment: post-procedure vital signs reviewed and stable Respiratory status: spontaneous breathing, nonlabored ventilation and respiratory function stable Cardiovascular status: blood pressure returned to baseline and stable Postop Assessment: no apparent nausea or vomiting (nausea improved) Anesthetic complications: no    Last Vitals:  Vitals:   08/04/17 1045 08/04/17 1140  BP: (!) 149/87 (!) 150/88  Pulse: (!) 55 62  Resp: 12 16  Temp:  36.6 C  SpO2: 94% 96%    Last Pain:  Vitals:   08/04/17 1140  TempSrc:   PainSc: 1                  Indya Oliveria,E. Chandan Fly

## 2017-08-04 NOTE — Anesthesia Procedure Notes (Signed)
Procedure Name: LMA Insertion Date/Time: 08/04/2017 9:17 AM Performed by: Annye Asa, MD Pre-anesthesia Checklist: Patient identified, Emergency Drugs available, Suction available and Patient being monitored Patient Re-evaluated:Patient Re-evaluated prior to induction Oxygen Delivery Method: Circle system utilized Preoxygenation: Pre-oxygenation with 100% oxygen Induction Type: IV induction Ventilation: Mask ventilation without difficulty LMA: LMA inserted LMA Size: 5.0 Number of attempts: 1 Airway Equipment and Method: Bite block Placement Confirmation: positive ETCO2 Tube secured with: Tape Dental Injury: Teeth and Oropharynx as per pre-operative assessment

## 2017-08-04 NOTE — Op Note (Signed)
Operative Note  Preoperative diagnosis:  1.  6 mm and 4 mm right distal ureteral stones 2.  4 mm left renal stone  Postoperative diagnosis: 1.  Passed right distal stones 2.  4 mm left renal stone  Procedure(s): 1.  Cystoscopy 2.  Bilateral reterograde pyelograms with intra-operative interpretation 3.  Bilateral ureteroscopy 4.  Laser lithotripsy 5.  Bilateral JJ stent placement (with tether)  Surgeon: Ellison Hughs, MD  Assistants:  None  Anesthesia:  Gen  Complications:  None  EBL:  <5 mL  Specimens: 1. Left renal stone  Drains/Catheters: 1.  Bilateral 50F JJ stents with tether  Intraoperative findings:  Passed right distal ureteral stone.  4 mm left renal stone  Indication:  Michael Huffman is a 66 y.o. male with a long history of nephrolithiasis.  He had a CT stone study on 05/11/2017 secondary to right flank pain that demonstrated 2 right distal ureteral stones measuring approximately 4 and 6 mm piece with mild hydronephrosis as well as a 4 mm left renal stone.  Currently, his flank pain is controlled and he denies nausea/vomiting, fever/chills or hematuria.  Description of procedure:  After informed consent was obtained, the patient was brought to the operating room and general LMA anesthesia was administered. The patient was then placed in the dorsolithotomy position and prepped and draped in usual sterile fashion. A timeout was performed. A 21 French rigid cystoscope was then inserted into the urethral meatus and advanced into the bladder under direct vision. A complete bladder survey revealed no intravesical pathology.  A 6 French open-ended catheter was then inserted into the right ureteral orifice and a retrograde pyelogram was obtained. The retrograde pyelogram showed no obvious filling defects along the entire length of the right ureter. All renal calyces were clearly outlined and showed no filling defects. A Glidewire was then inserted through the lumen of  the ureteral catheter and up to the right renal pelvis, under fluoroscopic guidance. A semirigid ureteroscope was then advanced alongside the safety wire and the distal aspects of the right ureter was inspected, identifying no obstructing stones. The semirigid ureteroscope was then exchanged for a flexible ureteroscope, which was advanced into the right renal pelvis. Full inspection of all renal calyces revealed no stones or other abnormalities. The flexible ureteroscope was then removed.  A left retrograde pyelogram was obtained in a similar fashion, showing no filling defects along the entirety of the left collecting system. A Glidewire was then advanced through the lumen of the ureteral catheter and up to the left renal pelvis, under fluoroscopic guidance. A flexible ureteroscope was then advanced alongside the wire and into position within the left renal pelvis. Inspection of all calyces eventually identified his 4 mm renal stone. A 200  laser was then used to fracture his stone into several smaller pieces. The largest piece was then grasped with an engage basket and removed under direct vision. I then placed bilateral 6 [place] JJ stents over each safety wire, with each stent showing good position within their respective collecting systems, confirming placement via fluoroscopy. The tether the stents were left intact. The patient's bladder was then drained. The tethers of the stents were then secured to the dorsal penile shaft with Mastisol and Steri-Strips. He tolerated the procedure well and was transferred to the postanesthesia in stable condition.  Plan: The patient has been instructed to remove his stents, which are on a tether, on 08/06/2017 at 6 AM. He will follow up in 6 weeks for a  renal ultrasound in the office.

## 2017-08-05 ENCOUNTER — Encounter (HOSPITAL_BASED_OUTPATIENT_CLINIC_OR_DEPARTMENT_OTHER): Payer: Self-pay | Admitting: Urology

## 2017-08-25 DIAGNOSIS — G4733 Obstructive sleep apnea (adult) (pediatric): Secondary | ICD-10-CM | POA: Diagnosis not present

## 2017-08-25 DIAGNOSIS — R7303 Prediabetes: Secondary | ICD-10-CM | POA: Diagnosis not present

## 2017-08-25 DIAGNOSIS — C8590 Non-Hodgkin lymphoma, unspecified, unspecified site: Secondary | ICD-10-CM | POA: Diagnosis not present

## 2017-08-25 DIAGNOSIS — I10 Essential (primary) hypertension: Secondary | ICD-10-CM | POA: Diagnosis not present

## 2017-08-25 DIAGNOSIS — N200 Calculus of kidney: Secondary | ICD-10-CM | POA: Diagnosis not present

## 2017-08-25 DIAGNOSIS — E7800 Pure hypercholesterolemia, unspecified: Secondary | ICD-10-CM | POA: Diagnosis not present

## 2017-08-25 DIAGNOSIS — E669 Obesity, unspecified: Secondary | ICD-10-CM | POA: Diagnosis not present

## 2017-08-25 DIAGNOSIS — K219 Gastro-esophageal reflux disease without esophagitis: Secondary | ICD-10-CM | POA: Diagnosis not present

## 2017-10-04 DIAGNOSIS — N281 Cyst of kidney, acquired: Secondary | ICD-10-CM | POA: Diagnosis not present

## 2017-10-04 DIAGNOSIS — N202 Calculus of kidney with calculus of ureter: Secondary | ICD-10-CM | POA: Diagnosis not present

## 2017-12-30 DIAGNOSIS — M25561 Pain in right knee: Secondary | ICD-10-CM | POA: Diagnosis not present

## 2017-12-30 DIAGNOSIS — M25562 Pain in left knee: Secondary | ICD-10-CM | POA: Diagnosis not present

## 2018-01-11 DIAGNOSIS — Z01818 Encounter for other preprocedural examination: Secondary | ICD-10-CM | POA: Diagnosis not present

## 2018-01-11 DIAGNOSIS — I10 Essential (primary) hypertension: Secondary | ICD-10-CM | POA: Diagnosis not present

## 2018-01-11 DIAGNOSIS — M179 Osteoarthritis of knee, unspecified: Secondary | ICD-10-CM | POA: Diagnosis not present

## 2018-03-01 DIAGNOSIS — Z1389 Encounter for screening for other disorder: Secondary | ICD-10-CM | POA: Diagnosis not present

## 2018-03-01 DIAGNOSIS — R7303 Prediabetes: Secondary | ICD-10-CM | POA: Diagnosis not present

## 2018-03-01 DIAGNOSIS — K219 Gastro-esophageal reflux disease without esophagitis: Secondary | ICD-10-CM | POA: Diagnosis not present

## 2018-03-01 DIAGNOSIS — M1990 Unspecified osteoarthritis, unspecified site: Secondary | ICD-10-CM | POA: Diagnosis not present

## 2018-03-01 DIAGNOSIS — Z23 Encounter for immunization: Secondary | ICD-10-CM | POA: Diagnosis not present

## 2018-03-01 DIAGNOSIS — C8590 Non-Hodgkin lymphoma, unspecified, unspecified site: Secondary | ICD-10-CM | POA: Diagnosis not present

## 2018-03-01 DIAGNOSIS — E7800 Pure hypercholesterolemia, unspecified: Secondary | ICD-10-CM | POA: Diagnosis not present

## 2018-03-01 DIAGNOSIS — Z125 Encounter for screening for malignant neoplasm of prostate: Secondary | ICD-10-CM | POA: Diagnosis not present

## 2018-03-01 DIAGNOSIS — Z0000 Encounter for general adult medical examination without abnormal findings: Secondary | ICD-10-CM | POA: Diagnosis not present

## 2018-03-01 DIAGNOSIS — N200 Calculus of kidney: Secondary | ICD-10-CM | POA: Diagnosis not present

## 2018-03-01 DIAGNOSIS — G4733 Obstructive sleep apnea (adult) (pediatric): Secondary | ICD-10-CM | POA: Diagnosis not present

## 2018-03-01 DIAGNOSIS — I10 Essential (primary) hypertension: Secondary | ICD-10-CM | POA: Diagnosis not present

## 2018-03-01 NOTE — H&P (Signed)
Anticipated LOS equal to or greater than 2 midnights due to - Age 68 and older with one or more of the following:  - Obesity  - Expected need for hospital services (PT, OT, Nursing) required for safe  discharge  - Anticipated need for postoperative skilled nursing care or inpatient rehab  -      Michael Huffman is an 67 y.o. male.    Chief Complaint: right knee pain  HPI: Pt is a 67 y.o. male complaining of right knee pain for multiple years. Pain had continually increased since the beginning. X-rays in the clinic show end-stage arthritic changes of the right knee. Pt has tried various conservative treatments which have failed to alleviate their symptoms, including injections and therapy. Various options are discussed with the patient. Risks, benefits and expectations were discussed with the patient. Patient understand the risks, benefits and expectations and wishes to proceed with surgery.   PCP:  Wenda Low, MD  D/C Plans: Home  PMH: Past Medical History:  Diagnosis Date  . Chronically dry eyes, bilateral   . GERD (gastroesophageal reflux disease)   . Hiatal hernia   . History of colon polyps 01/12/2013   hyperplastic  . History of external beam radiation therapy    face and right thigh , completed 2006 /  right forearm ,  4000 Gy,  completed 06/ 2017  . History of kidney stones   . History of radiation therapy    of lymphoma on skin: face and leg  . Hyperlipidemia   . Hypertension   . 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 Nephrolithiasis    bilateral   . OSA on CPAP   . 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 SSBE (short-segment Barrett's esophagus)     PSH: Past Surgical History:  Procedure Laterality Date  . COLONOSCOPY WITH ESOPHAGOGASTRODUODENOSCOPY (EGD)  01/12/2014   in [place]  . CYSTOSCOPY/URETEROSCOPY/HOLMIUM LASER/STENT PLACEMENT Bilateral 08/04/2017   Procedure: CYSTOSCOPY/ RETROGRADE/URETEROSCOPY/HOLMIUM LASER/STENT PLACEMENT;  Surgeon: Ceasar Mons, MD;  Location: [facility];  Service: Urology;  Laterality: Bilateral;  . EXTRACORPOREAL SHOCK WAVE LITHOTRIPSY  1990s  . HYDROCELE EXCISION Left 2000  approx.  . TONSILLECTOMY  child    Social History:  reports that he quit smoking about 28 years ago. His smoking use included cigarettes. He has a 44.00 pack-year smoking history. He has never used smokeless tobacco. He reports that he drinks about 4.8 oz of alcohol per week. He reports that he does not use drugs.  Allergies:  No Known Allergies  Medications: No current facility-administered medications for this encounter.    Current Outpatient Medications  Medication Sig Dispense Refill  . aspirin 81 MG chewable tablet Chew 81 mg by mouth daily.    . Azelastine HCl 0.15 % SOLN APPLY 1 SPRAY IN EACH NOSTRIL TWICE A DAY NASALLY 30 DAY(S)  6  . ciclopirox (LOPROX) 0.77 % cream Apply 1 application as needed topically.     . Fish Oil-Cholecalciferol (OMEGA-3 + VITAMIN D3 PO) Take 4 capsules by mouth daily.    Marland Kitchen  HYDROcodone-acetaminophen (NORCO) 5-325 MG tablet Take 1-2 tablets every 6 (six) hours as needed by mouth. 12 tablet 0  . Liniments (BLUE-EMU SUPER STRENGTH) CREA Apply 1 application as needed topically.     . Multiple Vitamins-Minerals (PRESERVISION/LUTEIN PO) Take 2 capsules by mouth daily.    . naproxen sodium (ANAPROX) 220 MG tablet Take 220 mg by mouth 2 (two) times daily as needed (pain). Reported on 01/03/2016    . omeprazole (PRILOSEC) 20 MG capsule Take 20 mg every evening by mouth.       . ondansetron (ZOFRAN) 4 MG tablet Take 1 tablet (4 mg total) daily as needed by mouth for nausea or vomiting. 30 tablet 1  . phenazopyridine (PYRIDIUM) 200 MG tablet Take 1 tablet (200 mg total) 3 (three) times daily as needed by mouth for pain. 30 tablet 0  . Polyethyl Glycol-Propyl Glycol (SYSTANE OP) Apply 1 drop to eye 3 (three) times daily. Each eye    . rosuvastatin (CRESTOR) 5 MG tablet Take 5 mg every morning by mouth.   2  . tamsulosin (FLOMAX) 0.4 MG CAPS capsule Take 1 capsule (0.4 mg total) at bedtime by mouth. 30 capsule 0  . valsartan-hydrochlorothiazide (DIOVAN-HCT) 160-12.5 MG tablet Take 1 tablet every morning by mouth.       No results found for this or any previous visit (from the past 48 hour(s)). No results found.  ROS: Pain with rom of the right lower extremity  Physical Exam: Alert and oriented 67 y.o. male in no acute distress Cranial nerves 2-12 intact Cervical spine: full rom with no tenderness, nv intact distally Chest: active breath sounds bilaterally, no wheeze rhonchi or rales Heart: regular rate and rhythm, no murmur Abd: non tender non distended with active bowel sounds Hip is stable with rom  Right knee with moderate medial and lateral joint line tenderness nv intact distally Antalgic gait  Assessment/Plan Assessment: right knee end stage osteoarthritis  Plan:  Patient will undergo a right total knee by Dr. Veverly Fells at [facility]. Risks benefits and expectations were discussed with the patient. Patient understand risks, benefits and expectations and wishes to proceed. The proposed joint has been templated with x-rays prior to surgery with the corresponding company representative.  Merla Riches PA-C, MPAS [facility] is now [employer] [address] [place], [place], [place] [address] Phone: [phone] [email or web address] Facebook  [employer]

## 2018-03-02 DIAGNOSIS — M25561 Pain in right knee: Secondary | ICD-10-CM | POA: Diagnosis not present

## 2018-03-17 NOTE — H&P (Signed)
Anticipated LOS equal to or greater than 2 midnights due to - Age 68 and older with one or more of the following:  - Obesity  - Expected need for hospital services (PT, OT, Nursing) required for safe  discharge  - Anticipated need for postoperative skilled nursing care or inpatient rehab   Michael Huffman is an 67 y.o. male.    Chief Complaint: right knee  HPI: Pt is a 67 y.o. male complaining of right knee pain for multiple years. Pain had continually increased since the beginning. X-rays in the clinic show end-stage arthritic changes of the right knee. Pt has tried various conservative treatments which have failed to alleviate their symptoms, including injections and therapy. Various options are discussed with the patient. Risks, benefits and expectations were discussed with the patient. Patient understand the risks, benefits and expectations and wishes to proceed with surgery.   PCP:  Wenda Low, MD  D/C Plans: Home  PMH: Past Medical History:  Diagnosis Date  . Chronically dry eyes, bilateral   . GERD (gastroesophageal reflux disease)   . Hiatal hernia   . History of colon polyps 01/12/2013   hyperplastic  . History of external beam radiation therapy    face and right thigh , completed 2006 /  right forearm ,  4000 Gy,  completed 06/ 2017  . History of kidney stones   . History of radiation therapy    of lymphoma on skin: face and leg  . Hyperlipidemia   . Hypertension   . 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 Nephrolithiasis    bilateral   . OSA on CPAP   . Primary cutaneous lymphoma [facility]) oncologist-  dr Charlaine Dalton (cancer center [place])--- per lov note 10--04-2017 -- no recurrence clinically   dx 2006 -- face and right thigh cutaneous lymphoma s/p radiation therapy (done in [place]);  2012 right forearm knot s/p bx- may 2015 nodular lymphocytic infiltrate s/o immunocytoma;  04/ 2017  cutaneous marginal zone B Cell lymphoma , right forearm (punch bx  [id number])  s/p  radiation therapy 06/ 2017  . SSBE (short-segment Barrett's esophagus)     PSH: Past Surgical History:  Procedure Laterality Date  . COLONOSCOPY WITH ESOPHAGOGASTRODUODENOSCOPY (EGD)  01/12/2014   in [place]  . CYSTOSCOPY/URETEROSCOPY/HOLMIUM LASER/STENT PLACEMENT Bilateral 08/04/2017   Procedure: CYSTOSCOPY/ RETROGRADE/URETEROSCOPY/HOLMIUM LASER/STENT PLACEMENT;  Surgeon: Ceasar Mons, MD;  Location: [facility];  Service: Urology;  Laterality: Bilateral;  . EXTRACORPOREAL SHOCK WAVE LITHOTRIPSY  1990s  . HYDROCELE EXCISION Left 2000  approx.  . TONSILLECTOMY  child    Social History:  reports that he quit smoking about 28 years ago. His smoking use included cigarettes. He has a 44.00 pack-year smoking history. He has never used smokeless tobacco. He reports that he drinks about 4.8 oz of alcohol per week. He reports that he does not use drugs.  Allergies:  No Known Allergies  Medications: No current facility-administered medications for this encounter.    Current Outpatient Medications  Medication Sig Dispense Refill  . Fish Oil-Cholecalciferol (OMEGA-3 + VITAMIN D3 PO) Take 4 capsules by mouth daily.    . Liniments (BLUE-EMU SUPER STRENGTH) CREA Apply 1 application topically daily as needed (pain).     Marland Kitchen losartan-hydrochlorothiazide (HYZAAR) 100-12.5 MG tablet Take 1 tablet by mouth daily.    . Multiple Vitamins-Minerals (PRESERVISION/LUTEIN PO) Take 2 capsules by mouth daily.    . naproxen sodium (ANAPROX) 220 MG tablet Take 220 mg  by mouth daily as needed (pain).     Marland Kitchen omeprazole (PRILOSEC) 20 MG capsule Take 20 mg by mouth every evening.     Vladimir Faster Glycol-Propyl Glycol (SYSTANE OP) Apply 1 drop to eye 4 (four) times daily. Each eye     . rosuvastatin (CRESTOR) 5 MG tablet Take 5 mg by mouth every morning.   2  . HYDROcodone-acetaminophen (NORCO) 5-325 MG tablet Take 1-2 tablets every 6 (six) hours as needed by mouth. (Patient  not taking: Reported on 03/14/2018) 12 tablet 0  . ondansetron (ZOFRAN) 4 MG tablet Take 1 tablet (4 mg total) daily as needed by mouth for nausea or vomiting. (Patient not taking: Reported on 03/14/2018) 30 tablet 1  . phenazopyridine (PYRIDIUM) 200 MG tablet Take 1 tablet (200 mg total) 3 (three) times daily as needed by mouth for pain. (Patient not taking: Reported on 03/14/2018) 30 tablet 0  . tamsulosin (FLOMAX) 0.4 MG CAPS capsule Take 1 capsule (0.4 mg total) at bedtime by mouth. (Patient not taking: Reported on 03/14/2018) 30 capsule 0    No results found for this or any previous visit (from the past 48 hour(s)). No results found.  ROS: Pain with rom of the right lower extremity  Physical Exam: Alert and oriented 67 y.o. male in no acute distress Cranial nerves 2-12 intact Cervical spine: full rom with no tenderness, nv intact distally Chest: active breath sounds bilaterally, no wheeze rhonchi or rales Heart: regular rate and rhythm, no murmur Abd: non tender non distended with active bowel sounds Hip is stable with rom  Right knee crepitus and pain with rom nv intact distally No rashes or edema Antalgic gait  Assessment/Plan Assessment: right knee end stage osteoarthritis  Plan:  Patient will undergo a right total knee by Dr. Veverly Fells at [facility]. Risks benefits and expectations were discussed with the patient. Patient understand risks, benefits and expectations and wishes to proceed. The proposed joint has been templated with x-rays prior to surgery with the corresponding company representative.  Merla Riches PA-C, MPAS [facility] is now [employer] [address] [place], [place], [place] [address] Phone: [phone] [email or web address] Facebook  [employer]

## 2018-03-18 ENCOUNTER — Other Ambulatory Visit: Payer: Self-pay | Admitting: Orthopedic Surgery

## 2018-03-18 ENCOUNTER — Inpatient Hospital Stay (HOSPITAL_COMMUNITY): Admission: RE | Admit: 2018-03-18 | Payer: Medicare Other | Source: Ambulatory Visit

## 2018-03-18 NOTE — Care Plan (Signed)
Ortho Bundle R TKA scheduled on 04-01-18 DCP:  Home with spouse.  2 story home with 3 ste.  DME:  Needs a RW.  Has elevated toilets and doesn't want a 3-in-1. PT:  [facility].  PT eval scheduled on 04-04-18.

## 2018-03-18 NOTE — Pre-Procedure Instructions (Signed)
Trestan Vahle Villalona  03/18/2018      [id number] [place], [place] - [address] [place] [address] Phone: [phone] Fax: [phone]    Your procedure is scheduled on Fri., April 01, 2018 from 7:30AM-9:43AM  Report to [facility] Admitting Entrance "A" at 5:30AM  Call this number if you have problems the morning of surgery:  [phone]   Remember:  Do not eat or drink after midnight on July 11th    Take these medicines the morning of surgery with A SIP OF WATER: Systane eye drops  7 days before surgery (7/5), stop taking all Other Aspirin Products, Vitamins, Fish oils, and Herbal medications. Also stop all NSAIDS i.e. Advil, Ibuprofen, Motrin, Aleve, Anaprox, Naproxen, BC, Goody Powders, and all Supplements.     Do not wear jewelry.  Do not wear lotions, powders, colognes, or deodorant.  Do not shave 48 hours prior to surgery.  Men may shave face.  Do not bring valuables to the hospital.  [facility] is not responsible for any belongings or valuables.  Contacts, dentures or bridgework may not be worn into surgery.  Leave your suitcase in the car.  After surgery it may be brought to your room.  For patients admitted to the hospital, discharge time will be determined by your treatment team.  Patients discharged the day of surgery will not be allowed to drive home.   Special instructions:  [facility]- Preparing For Surgery  Before surgery, you can play an important role. Because skin is not sterile, your skin needs to be as free of germs as possible. You can reduce the number of germs on your skin by washing with CHG (chlorahexidine gluconate) Soap before surgery.  CHG is an antiseptic cleaner which kills germs and bonds with the skin to continue killing germs even after washing.    Oral Hygiene is also important to reduce your risk of infection.  Remember - BRUSH YOUR TEETH THE MORNING OF SURGERY  WITH YOUR REGULAR TOOTHPASTE  Please do not use if you have an allergy to CHG or antibacterial soaps. If your skin becomes reddened/irritated stop using the CHG.  Do not shave (including legs and underarms) for at least 48 hours prior to first CHG shower. It is OK to shave your face.  Please follow these instructions carefully.   1. Shower the NIGHT BEFORE SURGERY and the MORNING OF SURGERY with CHG.   2. If you chose to wash your hair, wash your hair first as usual with your normal shampoo.  3. After you shampoo, rinse your hair and body thoroughly to remove the shampoo.  4. Use CHG as you would any other liquid soap. You can apply CHG directly to the skin and wash gently with a scrungie or a clean washcloth.   5. Apply the CHG Soap to your body ONLY FROM THE NECK DOWN.  Do not use on open wounds or open sores. Avoid contact with your eyes, ears, mouth and genitals (private parts). Wash Face and genitals (private parts)  with your normal soap.  6. Wash thoroughly, paying special attention to the area where your surgery will be performed.  7. Thoroughly rinse your body with warm water from the neck down.  8. DO NOT shower/wash with your normal soap after using and rinsing off the CHG Soap.  9. Pat yourself dry with a CLEAN TOWEL.  10. Wear CLEAN PAJAMAS to bed  the night before surgery, wear comfortable clothes the morning of surgery  11. Place CLEAN SHEETS on your bed the night of your first shower and DO NOT SLEEP WITH PETS.  Day of Surgery:  Do not apply any deodorants/lotions.  Please wear clean clothes to the hospital/surgery center.   Remember to brush your teeth WITH YOUR REGULAR TOOTHPASTE.  Please read over the following fact sheets that you were given. Pain Booklet, Coughing and Deep Breathing, MRSA Information and Surgical Site Infection Prevention

## 2018-03-21 ENCOUNTER — Encounter (HOSPITAL_COMMUNITY): Payer: Self-pay

## 2018-03-21 ENCOUNTER — Encounter (HOSPITAL_COMMUNITY)
Admission: RE | Admit: 2018-03-21 | Discharge: 2018-03-21 | Disposition: A | Discharge status: Home / Self Care | Payer: Medicare Other | Source: Ambulatory Visit | Attending: Orthopedic Surgery | Admitting: Orthopedic Surgery

## 2018-03-21 ENCOUNTER — Other Ambulatory Visit: Payer: Self-pay

## 2018-03-21 DIAGNOSIS — M1711 Unilateral primary osteoarthritis, right knee: Secondary | ICD-10-CM | POA: Diagnosis not present

## 2018-03-21 DIAGNOSIS — Z01812 Encounter for preprocedural laboratory examination: Secondary | ICD-10-CM | POA: Diagnosis not present

## 2018-03-21 LAB — COMPREHENSIVE METABOLIC PANEL
ALT: 35 U/L (ref 0–44)
AST: 27 U/L (ref 15–41)
Albumin: 3.9 g/dL (ref 3.5–5.0)
Alkaline Phosphatase: 88 U/L (ref 38–126)
Anion gap: 12 (ref 5–15)
BUN: 13 mg/dL (ref 8–23)
CO2: 25 mmol/L (ref 22–32)
Calcium: 9.2 mg/dL (ref 8.9–10.3)
Chloride: 102 mmol/L (ref 98–111)
Creatinine, Ser: 0.77 mg/dL (ref 0.61–1.24)
GFR calc Af Amer: >60 mL/min (ref >60)
GFR calc non Af Amer: >60 mL/min (ref >60)
Glucose, Bld: 113 mg/dL — ABNORMAL HIGH (ref 70–99)
Potassium: 3.9 mmol/L (ref 3.5–5.1)
Sodium: 139 mmol/L (ref 135–145)
Total Bilirubin: 1.1 mg/dL (ref 0.3–1.2)
Total Protein: 6.6 g/dL (ref 6.5–8.1)

## 2018-03-21 LAB — CBC
HCT: 48.4 % (ref 39.0–52.0)
Hemoglobin: 15.5 g/dL (ref 13.0–17.0)
MCH: 28 pg (ref 26.0–34.0)
MCHC: 32 g/dL (ref 30.0–36.0)
MCV: 87.5 fL (ref 78.0–100.0)
Platelets: 242 10*3/uL (ref 150–400)
RBC: 5.53 MIL/uL (ref 4.22–5.81)
RDW: 12.9 % (ref 11.5–15.5)
WBC: 4.8 10*3/uL (ref 4.0–10.5)

## 2018-03-21 LAB — SURGICAL PCR SCREEN
MRSA, PCR: NEGATIVE
Staphylococcus aureus: NEGATIVE

## 2018-03-21 NOTE — Progress Notes (Addendum)
PCP: Wenda Low, MD  Cardiologist: pt denies  EKG: 08/04/17 in [facility]  Stress test: in the past 5 years in [place]-records requested  ECHO: in [place]-records requested  Cardiac Cath: pt denies  Chest x-ray: pt denies past year, no recent respiratory infections/complications

## 2018-03-21 NOTE — Progress Notes (Signed)
Anesthesia Chart Review:   Case:  [id number] Date/Time:  04/01/18 0715   Procedure:  TOTAL KNEE ARTHROPLASTY (Right )   Anesthesia type:  General   Pre-op diagnosis:  Right knee end stage osteoarthritis   Location:  [facility] OR ROOM 04 / [facility]   Surgeon:  Netta Cedars, MD      DISCUSSION: - Pt is a 67 year old male with hx HTN, OSA, cutaneous lymphoma   VS: BP 131/75   Pulse (!) 57   Temp 36.7 C   Resp 20   Ht 6\' 2"  (1.88 m)   Wt 265 lb 12.8 oz (120.6 kg)   SpO2 96%   BMI 34.13 kg/m   PROVIDERS: - PCP is Wenda Low, MD   - Used to see cardiologist Jacqulynn Cadet, MD at [facility] [place] for DOE, abnormal EKG.  Last office visit 2016; prn f/u recommended. Pt no longer sees cardiology.    LABS: Labs reviewed: Acceptable for surgery. (all labs ordered are listed, but only abnormal results are displayed)  Labs Reviewed  COMPREHENSIVE METABOLIC PANEL - Abnormal; Notable for the following components:      Result Value   Glucose, Bld 113 (*)    All other components within normal limits  SURGICAL PCR SCREEN  CBC    EKG 08/04/17: NSR. Inferior infarct, age undetermined. Possible anterior infarct   CV:  Nuclear stress test 11/23/13 ([facility] [place]): - Stress EKG negative for ischemia - Normal rest-stress myocardial perfusion - Normal regional LV wall motion and wall thickening.  - LVEF 67%  Echo 12/30/10 ([facility], [facility] [place]):  - Normal LV dimensions with preserved systolic function. LVEF 60-65%.  - Normal RV dimensions and contractility - Mildly sclerotic aortic valve with normal function   Past Medical History:  Diagnosis Date  . Chronically dry eyes, bilateral   . GERD (gastroesophageal reflux disease)   . Hiatal hernia   . History of colon polyps 01/12/2013   hyperplastic  . History of external beam radiation therapy    face and  right thigh , completed 2006 /  right forearm ,  4000 Gy,  completed 06/ 2017  . History of kidney stones   . History of radiation therapy    of lymphoma on skin: face and leg  . Hyperlipidemia   . Hypertension   . 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 Nephrolithiasis    bilateral   . OSA on CPAP   . Primary cutaneous lymphoma [facility]) oncologist-  dr Charlaine Dalton (cancer center [place])--- per lov note 10--04-2017 -- no recurrence clinically   dx 2006 -- face and right thigh cutaneous lymphoma s/p radiation therapy (done in [place]);  2012 right forearm knot s/p bx- may 2015 nodular lymphocytic infiltrate s/o immunocytoma;  04/ 2017  cutaneous marginal zone B Cell lymphoma , right forearm (punch bx [id number])  s/p  radiation therapy 06/ 2017  . SSBE (short-segment Barrett's esophagus)     Past Surgical History:  Procedure Laterality Date  . COLONOSCOPY WITH ESOPHAGOGASTRODUODENOSCOPY (EGD)  01/12/2014   in [place]  . CYSTOSCOPY/URETEROSCOPY/HOLMIUM LASER/STENT PLACEMENT Bilateral 08/04/2017   Procedure: CYSTOSCOPY/ RETROGRADE/URETEROSCOPY/HOLMIUM LASER/STENT PLACEMENT;  Surgeon: Ceasar Mons, MD;  Location: [facility];  Service: Urology;  Laterality: Bilateral;  . EXTRACORPOREAL SHOCK WAVE LITHOTRIPSY  1990s  . HYDROCELE EXCISION Left 2000  approx.  . TONSILLECTOMY  child    MEDICATIONS: . Fish Oil-Cholecalciferol (OMEGA-3 + VITAMIN D3 PO)  . HYDROcodone-acetaminophen (NORCO) 5-325 MG tablet  . Liniments (BLUE-EMU SUPER STRENGTH) CREA  . losartan-hydrochlorothiazide (HYZAAR) 100-12.5 MG tablet  . Multiple Vitamins-Minerals (PRESERVISION/LUTEIN PO)  . naproxen sodium (ANAPROX) 220 MG tablet  . omeprazole (PRILOSEC) 20 MG capsule  . ondansetron (ZOFRAN) 4 MG tablet  . phenazopyridine (PYRIDIUM) 200 MG tablet  . Polyethyl Glycol-Propyl Glycol (SYSTANE OP)  . rosuvastatin (CRESTOR) 5 MG tablet  . tamsulosin (FLOMAX) 0.4 MG CAPS capsule   No  current facility-administered medications for this encounter.     If no changes, I anticipate pt can proceed with surgery as scheduled.   Willeen Cass, FNP-BC [facility]/Anesthesiology Phone: [phone] 03/21/2018 1:56 PM

## 2018-03-22 DIAGNOSIS — M545 Low back pain: Secondary | ICD-10-CM | POA: Diagnosis not present

## 2018-03-31 MED ORDER — DEXTROSE 5 % IV SOLN
3.0000 g | INTRAVENOUS | Status: AC
  Administered 2018-04-01: 3 g via INTRAVENOUS
  Filled 2018-03-31: qty 3

## 2018-03-31 MED ORDER — TRANEXAMIC ACID 1000 MG/10ML IV SOLN
1000.0000 mg | INTRAVENOUS | Status: AC
  Administered 2018-04-01: 1000 mg via INTRAVENOUS
  Filled 2018-03-31: qty 1100

## 2018-03-31 NOTE — Progress Notes (Signed)
Pt aware to arrive at [facility] at 5:30am for scheduled surgery; pt verbalized understanding of no food or drink after midnight.

## 2018-04-01 ENCOUNTER — Inpatient Hospital Stay (HOSPITAL_COMMUNITY)
Admission: RE | Admit: 2018-04-01 | Discharge: 2018-04-04 | DRG: 470 | Disposition: A | Discharge status: Home / Self Care | Payer: Medicare Other | Attending: Orthopedic Surgery | Admitting: Orthopedic Surgery

## 2018-04-01 ENCOUNTER — Other Ambulatory Visit: Payer: Self-pay

## 2018-04-01 ENCOUNTER — Inpatient Hospital Stay (HOSPITAL_COMMUNITY): Payer: Medicare Other | Admitting: Emergency Medicine

## 2018-04-01 ENCOUNTER — Inpatient Hospital Stay (HOSPITAL_COMMUNITY): Payer: Medicare Other | Admitting: Anesthesiology

## 2018-04-01 ENCOUNTER — Encounter (HOSPITAL_COMMUNITY): Payer: Self-pay | Admitting: *Deleted

## 2018-04-01 ENCOUNTER — Encounter (HOSPITAL_COMMUNITY)
Admission: RE | Disposition: A | Discharge status: Home / Self Care | Payer: Self-pay | Source: Home / Self Care | Attending: Orthopedic Surgery

## 2018-04-01 DIAGNOSIS — R109 Unspecified abdominal pain: Secondary | ICD-10-CM

## 2018-04-01 DIAGNOSIS — H3530 Unspecified macular degeneration: Secondary | ICD-10-CM | POA: Diagnosis present

## 2018-04-01 DIAGNOSIS — M1711 Unilateral primary osteoarthritis, right knee: Principal | ICD-10-CM | POA: Diagnosis present

## 2018-04-01 DIAGNOSIS — Z87891 Personal history of nicotine dependence: Secondary | ICD-10-CM

## 2018-04-01 DIAGNOSIS — Z6834 Body mass index (BMI) 34.0-34.9, adult: Secondary | ICD-10-CM

## 2018-04-01 DIAGNOSIS — R140 Abdominal distension (gaseous): Secondary | ICD-10-CM | POA: Diagnosis not present

## 2018-04-01 DIAGNOSIS — K567 Ileus, unspecified: Secondary | ICD-10-CM | POA: Diagnosis not present

## 2018-04-01 DIAGNOSIS — K219 Gastro-esophageal reflux disease without esophagitis: Secondary | ICD-10-CM | POA: Diagnosis present

## 2018-04-01 DIAGNOSIS — I10 Essential (primary) hypertension: Secondary | ICD-10-CM | POA: Diagnosis present

## 2018-04-01 DIAGNOSIS — Z96651 Presence of right artificial knee joint: Secondary | ICD-10-CM

## 2018-04-01 DIAGNOSIS — E785 Hyperlipidemia, unspecified: Secondary | ICD-10-CM | POA: Diagnosis present

## 2018-04-01 DIAGNOSIS — Z8572 Personal history of non-Hodgkin lymphomas: Secondary | ICD-10-CM | POA: Diagnosis not present

## 2018-04-01 DIAGNOSIS — K5900 Constipation, unspecified: Secondary | ICD-10-CM

## 2018-04-01 DIAGNOSIS — Z923 Personal history of irradiation: Secondary | ICD-10-CM | POA: Diagnosis not present

## 2018-04-01 DIAGNOSIS — G8918 Other acute postprocedural pain: Secondary | ICD-10-CM | POA: Diagnosis not present

## 2018-04-01 HISTORY — PX: TOTAL KNEE ARTHROPLASTY: SHX125

## 2018-04-01 SURGERY — ARTHROPLASTY, KNEE, TOTAL
Anesthesia: Monitor Anesthesia Care | Site: Knee | Laterality: Right

## 2018-04-01 MED ORDER — MIDAZOLAM HCL 5 MG/5ML IJ SOLN
INTRAMUSCULAR | Status: DC | PRN
  Administered 2018-04-01: 2 mg via INTRAVENOUS

## 2018-04-01 MED ORDER — FENTANYL CITRATE (PF) 100 MCG/2ML IJ SOLN
INTRAMUSCULAR | Status: DC | PRN
  Administered 2018-04-01: 75 ug via INTRAVENOUS
  Administered 2018-04-01: 25 ug via INTRAVENOUS

## 2018-04-01 MED ORDER — MIDAZOLAM HCL 2 MG/2ML IJ SOLN
INTRAMUSCULAR | Status: AC
  Filled 2018-04-01: qty 2

## 2018-04-01 MED ORDER — ASPIRIN 81 MG PO CHEW: 81.0000 mg | CHEWABLE_TABLET | Freq: Two times a day (BID) | ORAL | 0 refills | Status: DC

## 2018-04-01 MED ORDER — PHENOL 1.4 % MT LIQD: 1.0000 | OROMUCOSAL | Status: DC | PRN

## 2018-04-01 MED ORDER — SODIUM CHLORIDE 0.9 % IV SOLN
INTRAVENOUS | Status: DC | PRN
  Administered 2018-04-01: 20 ug/min via INTRAVENOUS

## 2018-04-01 MED ORDER — PHENYLEPHRINE 40 MCG/ML (10ML) SYRINGE FOR IV PUSH (FOR BLOOD PRESSURE SUPPORT)
PREFILLED_SYRINGE | INTRAVENOUS | Status: DC | PRN
  Administered 2018-04-01 (×3): 80 ug via INTRAVENOUS
  Administered 2018-04-01 (×2): 40 ug via INTRAVENOUS
  Administered 2018-04-01: 80 ug via INTRAVENOUS

## 2018-04-01 MED ORDER — PANTOPRAZOLE SODIUM 40 MG PO TBEC
40.0000 mg | DELAYED_RELEASE_TABLET | Freq: Every day | ORAL | Status: DC
  Administered 2018-04-01 – 2018-04-04 (×3): 40 mg via ORAL
  Filled 2018-04-01 (×2): qty 1

## 2018-04-01 MED ORDER — LACTATED RINGERS IV SOLN
INTRAVENOUS | Status: DC
  Administered 2018-04-01 (×2): via INTRAVENOUS

## 2018-04-01 MED ORDER — PROPOFOL 10 MG/ML IV BOLUS
INTRAVENOUS | Status: AC
  Filled 2018-04-01: qty 20

## 2018-04-01 MED ORDER — HYDROMORPHONE HCL 1 MG/ML IJ SOLN
0.5000 mg | INTRAMUSCULAR | Status: DC | PRN
  Administered 2018-04-01: 1 mg via INTRAVENOUS
  Administered 2018-04-01 (×2): 0.5 mg via INTRAVENOUS
  Administered 2018-04-02: 1 mg via INTRAVENOUS
  Filled 2018-04-01 (×3): qty 1

## 2018-04-01 MED ORDER — BUPIVACAINE IN DEXTROSE 0.75-8.25 % IT SOLN
INTRATHECAL | Status: DC | PRN
  Administered 2018-04-01: 2 mL via INTRATHECAL

## 2018-04-01 MED ORDER — METHOCARBAMOL 1000 MG/10ML IJ SOLN
500.0000 mg | Freq: Four times a day (QID) | INTRAVENOUS | Status: DC | PRN
  Administered 2018-04-01: 500 mg via INTRAVENOUS
  Filled 2018-04-01: qty 550

## 2018-04-01 MED ORDER — DEXAMETHASONE SODIUM PHOSPHATE 10 MG/ML IJ SOLN
INTRAMUSCULAR | Status: DC | PRN
  Administered 2018-04-01: 10 mg via INTRAVENOUS

## 2018-04-01 MED ORDER — POLYVINYL ALCOHOL 1.4 % OP SOLN
Freq: Four times a day (QID) | OPHTHALMIC | Status: DC
  Administered 2018-04-01: 19:00:00 via OPHTHALMIC
  Administered 2018-04-01: 2 [drp] via OPHTHALMIC
  Administered 2018-04-02 (×3): via OPHTHALMIC
  Administered 2018-04-02: 1 [drp] via OPHTHALMIC
  Administered 2018-04-03 – 2018-04-04 (×6): via OPHTHALMIC
  Filled 2018-04-01: qty 15

## 2018-04-01 MED ORDER — OXYCODONE HCL 5 MG PO TABS: 5.0000 mg | ORAL_TABLET | Freq: Once | ORAL | Status: DC | PRN

## 2018-04-01 MED ORDER — EPHEDRINE 5 MG/ML INJ
INTRAVENOUS | Status: AC
  Filled 2018-04-01: qty 10

## 2018-04-01 MED ORDER — ROSUVASTATIN CALCIUM 5 MG PO TABS
5.0000 mg | ORAL_TABLET | Freq: Every morning | ORAL | Status: DC
  Administered 2018-04-01 – 2018-04-04 (×4): 5 mg via ORAL
  Filled 2018-04-01 (×4): qty 1

## 2018-04-01 MED ORDER — NAPROXEN SODIUM 220 MG PO TABS: 220.0000 mg | ORAL_TABLET | Freq: Every day | ORAL | Status: DC | PRN

## 2018-04-01 MED ORDER — LOSARTAN POTASSIUM-HCTZ 100-12.5 MG PO TABS: 1.0000 | ORAL_TABLET | Freq: Every day | ORAL | Status: DC

## 2018-04-01 MED ORDER — ROCURONIUM BROMIDE 100 MG/10ML IV SOLN
INTRAVENOUS | Status: AC
  Filled 2018-04-01: qty 1

## 2018-04-01 MED ORDER — SODIUM CHLORIDE 0.9 % IR SOLN
Status: DC | PRN
  Administered 2018-04-01: 3000 mL

## 2018-04-01 MED ORDER — POLYETHYLENE GLYCOL 3350 17 G PO PACK
17.0000 g | PACK | Freq: Every day | ORAL | Status: DC | PRN
  Administered 2018-04-03: 17 g via ORAL
  Filled 2018-04-01: qty 1

## 2018-04-01 MED ORDER — ONDANSETRON HCL 4 MG/2ML IJ SOLN
INTRAMUSCULAR | Status: AC
  Filled 2018-04-01: qty 4

## 2018-04-01 MED ORDER — OXYCODONE-ACETAMINOPHEN 5-325 MG PO TABS: 1.0000 | ORAL_TABLET | ORAL | 0 refills | Status: DC | PRN

## 2018-04-01 MED ORDER — PHENYLEPHRINE 40 MCG/ML (10ML) SYRINGE FOR IV PUSH (FOR BLOOD PRESSURE SUPPORT)
PREFILLED_SYRINGE | INTRAVENOUS | Status: AC
  Filled 2018-04-01: qty 10

## 2018-04-01 MED ORDER — LOSARTAN POTASSIUM 50 MG PO TABS
100.0000 mg | ORAL_TABLET | Freq: Every day | ORAL | Status: DC
  Administered 2018-04-02 – 2018-04-04 (×3): 100 mg via ORAL
  Filled 2018-04-01 (×3): qty 2

## 2018-04-01 MED ORDER — MENTHOL 3 MG MT LOZG: 1.0000 | LOZENGE | OROMUCOSAL | Status: DC | PRN

## 2018-04-01 MED ORDER — OXYCODONE HCL 5 MG PO TABS
5.0000 mg | ORAL_TABLET | ORAL | Status: DC | PRN
  Administered 2018-04-01 (×2): 10 mg via ORAL
  Administered 2018-04-01 (×2): 5 mg via ORAL
  Administered 2018-04-02 – 2018-04-04 (×9): 10 mg via ORAL
  Filled 2018-04-01: qty 1
  Filled 2018-04-01 (×5): qty 2
  Filled 2018-04-01: qty 1
  Filled 2018-04-01 (×6): qty 2

## 2018-04-01 MED ORDER — PROPOFOL 500 MG/50ML IV EMUL
INTRAVENOUS | Status: DC | PRN
  Administered 2018-04-01: 80 ug/kg/min via INTRAVENOUS

## 2018-04-01 MED ORDER — OXYCODONE HCL 5 MG/5ML PO SOLN
5.0000 mg | Freq: Once | ORAL | Status: DC | PRN
  Filled 2018-04-01: qty 5

## 2018-04-01 MED ORDER — DEXAMETHASONE SODIUM PHOSPHATE 10 MG/ML IJ SOLN
INTRAMUSCULAR | Status: AC
  Filled 2018-04-01: qty 2

## 2018-04-01 MED ORDER — ACETAMINOPHEN 325 MG PO TABS
325.0000 mg | ORAL_TABLET | Freq: Four times a day (QID) | ORAL | Status: DC | PRN
  Administered 2018-04-04: 650 mg via ORAL
  Filled 2018-04-01: qty 2

## 2018-04-01 MED ORDER — HYDROCHLOROTHIAZIDE 12.5 MG PO CAPS
12.5000 mg | ORAL_CAPSULE | Freq: Every day | ORAL | Status: DC
  Administered 2018-04-02 – 2018-04-04 (×3): 12.5 mg via ORAL
  Filled 2018-04-01 (×3): qty 1

## 2018-04-01 MED ORDER — EPHEDRINE SULFATE-NACL 50-0.9 MG/10ML-% IV SOSY
PREFILLED_SYRINGE | INTRAVENOUS | Status: DC | PRN
  Administered 2018-04-01 (×2): 5 mg via INTRAVENOUS

## 2018-04-01 MED ORDER — CEFAZOLIN SODIUM-DEXTROSE 2-4 GM/100ML-% IV SOLN
2.0000 g | Freq: Four times a day (QID) | INTRAVENOUS | Status: AC
  Administered 2018-04-01 (×2): 2 g via INTRAVENOUS
  Filled 2018-04-01 (×2): qty 100

## 2018-04-01 MED ORDER — TRANEXAMIC ACID 1000 MG/10ML IV SOLN
1000.0000 mg | Freq: Once | INTRAVENOUS | Status: AC
  Administered 2018-04-01: 1000 mg via INTRAVENOUS
  Filled 2018-04-01: qty 10

## 2018-04-01 MED ORDER — ONDANSETRON HCL 4 MG/2ML IJ SOLN: 4.0000 mg | Freq: Four times a day (QID) | INTRAMUSCULAR | Status: DC | PRN

## 2018-04-01 MED ORDER — PANTOPRAZOLE SODIUM 40 MG PO TBEC
40.0000 mg | DELAYED_RELEASE_TABLET | Freq: Every day | ORAL | Status: DC
  Administered 2018-04-01: 40 mg via ORAL
  Filled 2018-04-01: qty 1

## 2018-04-01 MED ORDER — SODIUM CHLORIDE 0.9 % IV SOLN
INTRAVENOUS | Status: DC
  Administered 2018-04-01: 12:00:00 via INTRAVENOUS

## 2018-04-01 MED ORDER — PHENYLEPHRINE HCL 10 MG/ML IJ SOLN
INTRAMUSCULAR | Status: AC
  Filled 2018-04-01: qty 1

## 2018-04-01 MED ORDER — METHOCARBAMOL 500 MG PO TABS
500.0000 mg | ORAL_TABLET | Freq: Four times a day (QID) | ORAL | Status: DC | PRN
  Administered 2018-04-01 – 2018-04-04 (×7): 500 mg via ORAL
  Filled 2018-04-01 (×7): qty 1

## 2018-04-01 MED ORDER — METOCLOPRAMIDE HCL 5 MG PO TABS: 5.0000 mg | ORAL_TABLET | Freq: Three times a day (TID) | ORAL | Status: DC | PRN

## 2018-04-01 MED ORDER — DOCUSATE SODIUM 100 MG PO CAPS
100.0000 mg | ORAL_CAPSULE | Freq: Two times a day (BID) | ORAL | Status: DC
  Administered 2018-04-01 – 2018-04-04 (×6): 100 mg via ORAL
  Filled 2018-04-01 (×6): qty 1

## 2018-04-01 MED ORDER — 0.9 % SODIUM CHLORIDE (POUR BTL) OPTIME
TOPICAL | Status: DC | PRN
  Administered 2018-04-01: 1000 mL

## 2018-04-01 MED ORDER — METOCLOPRAMIDE HCL 5 MG/ML IJ SOLN: 5.0000 mg | Freq: Three times a day (TID) | INTRAMUSCULAR | Status: DC | PRN

## 2018-04-01 MED ORDER — ONDANSETRON HCL 4 MG PO TABS: 4.0000 mg | ORAL_TABLET | Freq: Four times a day (QID) | ORAL | Status: DC | PRN

## 2018-04-01 MED ORDER — ASPIRIN 81 MG PO CHEW
81.0000 mg | CHEWABLE_TABLET | Freq: Two times a day (BID) | ORAL | Status: DC
  Administered 2018-04-01 – 2018-04-04 (×6): 81 mg via ORAL
  Filled 2018-04-01 (×6): qty 1

## 2018-04-01 MED ORDER — FERROUS SULFATE 325 (65 FE) MG PO TABS
325.0000 mg | ORAL_TABLET | Freq: Three times a day (TID) | ORAL | Status: DC
  Administered 2018-04-02 – 2018-04-04 (×3): 325 mg via ORAL
  Filled 2018-04-01 (×5): qty 1

## 2018-04-01 MED ORDER — ROPIVACAINE HCL 7.5 MG/ML IJ SOLN
INTRAMUSCULAR | Status: DC | PRN
  Administered 2018-04-01: 20 mL via PERINEURAL

## 2018-04-01 MED ORDER — FENTANYL CITRATE (PF) 100 MCG/2ML IJ SOLN: 25.0000 ug | INTRAMUSCULAR | Status: DC | PRN

## 2018-04-01 MED ORDER — BISACODYL 10 MG RE SUPP: 10.0000 mg | Freq: Every day | RECTAL | Status: DC | PRN

## 2018-04-01 MED ORDER — METHOCARBAMOL 500 MG PO TABS: 500.0000 mg | ORAL_TABLET | Freq: Three times a day (TID) | ORAL | 1 refills | Status: DC | PRN

## 2018-04-01 MED ORDER — BLUE-EMU SUPER STRENGTH EX CREA: 1.0000 "application " | TOPICAL_CREAM | Freq: Every day | CUTANEOUS | Status: DC | PRN

## 2018-04-01 MED ORDER — FENTANYL CITRATE (PF) 100 MCG/2ML IJ SOLN
INTRAMUSCULAR | Status: AC
  Filled 2018-04-01: qty 2

## 2018-04-01 MED ORDER — PROPOFOL 10 MG/ML IV BOLUS
INTRAVENOUS | Status: AC
  Filled 2018-04-01: qty 40

## 2018-04-01 MED ORDER — ONDANSETRON HCL 4 MG/2ML IJ SOLN
INTRAMUSCULAR | Status: DC | PRN
  Administered 2018-04-01: 4 mg via INTRAVENOUS

## 2018-04-01 MED ORDER — CHLORHEXIDINE GLUCONATE 4 % EX LIQD: 60.0000 mL | Freq: Once | CUTANEOUS | Status: DC

## 2018-04-01 SURGICAL SUPPLY — 62 items
BAG ZIPLOCK 12X15 (MISCELLANEOUS) ×2 IMPLANT
BANDAGE ESMARK 6X9 LF (GAUZE/BANDAGES/DRESSINGS) ×1 IMPLANT
BLADE SAG 18X100X1.27 (BLADE) ×2 IMPLANT
BLADE SAW SGTL 11.0X1.19X90.0M (BLADE) ×2 IMPLANT
BNDG ELASTIC 6X10 VLCR STRL LF (GAUZE/BANDAGES/DRESSINGS) ×2 IMPLANT
BNDG ESMARK 6X9 LF (GAUZE/BANDAGES/DRESSINGS) ×2
BNDG GAUZE ELAST 4 BULKY (GAUZE/BANDAGES/DRESSINGS) ×4 IMPLANT
BOWL SMART MIX CTS (DISPOSABLE) ×2 IMPLANT
CAP KNEE TOTAL 3 SIGMA ×2 IMPLANT
CEMENT HV SMART SET (Cement) ×2 IMPLANT
CLEANER TIP ELECTROSURG 2X2 (MISCELLANEOUS) ×2 IMPLANT
COVER SURGICAL LIGHT HANDLE (MISCELLANEOUS) ×2 IMPLANT
CUFF TOURN SGL QUICK 34 (TOURNIQUET CUFF) ×1
CUFF TRNQT CYL 34X4X40X1 (TOURNIQUET CUFF) ×1 IMPLANT
DRAPE EXTREMITY T 121X128X90 (DRAPE) ×2 IMPLANT
DRAPE ORTHO SPLIT 77X108 STRL (DRAPES) ×1
DRAPE POUCH INSTRU U-SHP 10X18 (DRAPES) ×2 IMPLANT
DRAPE SHEET LG 3/4 BI-LAMINATE (DRAPES) ×2 IMPLANT
DRAPE SURG ORHT 6 SPLT 77X108 (DRAPES) ×1 IMPLANT
DRAPE U-SHAPE 47X51 STRL (DRAPES) ×2 IMPLANT
DRSG ADAPTIC 3X8 NADH LF (GAUZE/BANDAGES/DRESSINGS) ×2 IMPLANT
DRSG PAD ABDOMINAL 8X10 ST (GAUZE/BANDAGES/DRESSINGS) ×2 IMPLANT
DURAPREP 26ML APPLICATOR (WOUND CARE) ×4 IMPLANT
ELECT REM PT RETURN 15FT ADLT (MISCELLANEOUS) ×2 IMPLANT
FACESHIELD WRAPAROUND (MASK) ×6 IMPLANT
GAUZE SPONGE 4X4 12PLY STRL (GAUZE/BANDAGES/DRESSINGS) ×2 IMPLANT
GLOVE BIOGEL PI IND STRL 8.5 (GLOVE) ×1 IMPLANT
GLOVE BIOGEL PI INDICATOR 8.5 (GLOVE) ×1
GLOVE ORTHO TXT STRL SZ7.5 (GLOVE) ×2 IMPLANT
GLOVE SURG ORTHO 8.5 STRL (GLOVE) ×4 IMPLANT
GLOVE SURG SS PI 7.0 STRL IVOR (GLOVE) ×4 IMPLANT
GOWN STRL REUS W/TWL LRG LVL3 (GOWN DISPOSABLE) ×4 IMPLANT
HANDPIECE INTERPULSE COAX TIP (DISPOSABLE) ×1
IMMOBILIZER KNEE 20 (SOFTGOODS)
IMMOBILIZER KNEE 20 THIGH 36 (SOFTGOODS) IMPLANT
IMMOBILIZER KNEE 22 UNIV (SOFTGOODS) ×2 IMPLANT
KIT BASIN OR (CUSTOM PROCEDURE TRAY) ×2 IMPLANT
MANIFOLD NEPTUNE II (INSTRUMENTS) ×2 IMPLANT
NDL SAFETY ECLIPSE 18X1.5 (NEEDLE) IMPLANT
NEEDLE HYPO 18GX1.5 SHARP (NEEDLE)
NS IRRIG 1000ML POUR BTL (IV SOLUTION) ×2 IMPLANT
PENCIL HANDSWITCHING (ELECTRODE) ×2 IMPLANT
POSITIONER SURGICAL ARM (MISCELLANEOUS) IMPLANT
SET HNDPC FAN SPRY TIP SCT (DISPOSABLE) ×1 IMPLANT
SPONGE LAP 18X18 RF (DISPOSABLE) ×2 IMPLANT
STAPLER VISISTAT 35W (STAPLE) IMPLANT
STRIP CLOSURE SKIN 1/2X4 (GAUZE/BANDAGES/DRESSINGS) ×4 IMPLANT
SUCTION FRAZIER HANDLE 12FR (TUBING) ×1
SUCTION TUBE FRAZIER 12FR DISP (TUBING) ×1 IMPLANT
SUT BONE WAX W31G (SUTURE) ×2 IMPLANT
SUT MNCRL AB 3-0 PS2 18 (SUTURE) ×2 IMPLANT
SUT VIC AB 0 CT1 27 (SUTURE) ×1
SUT VIC AB 0 CT1 27XBRD ANTBC (SUTURE) ×1 IMPLANT
SUT VIC AB 0 CT1 36 (SUTURE) ×2 IMPLANT
SUT VIC AB 1 CT1 27 (SUTURE) ×2
SUT VIC AB 1 CT1 27XBRD ANTBC (SUTURE) ×2 IMPLANT
SUT VIC AB 2-0 CT1 27 (SUTURE) ×3
SUT VIC AB 2-0 CT1 TAPERPNT 27 (SUTURE) ×3 IMPLANT
SYR 3ML LL SCALE MARK (SYRINGE) IMPLANT
TOWEL OR 17X26 10 PK STRL BLUE (TOWEL DISPOSABLE) ×4 IMPLANT
TRAY FOLEY MTR SLVR 16FR STAT (SET/KITS/TRAYS/PACK) ×2 IMPLANT
WATER STERILE IRR 1000ML POUR (IV SOLUTION) IMPLANT

## 2018-04-01 NOTE — Anesthesia Procedure Notes (Signed)
Spinal  Patient location during procedure: OR Start time: 04/01/2018 7:39 AM End time: 04/01/2018 7:44 AM Staffing Anesthesiologist: Oleta Mouse, MD Preanesthetic Checklist Completed: patient identified, surgical consent, pre-op evaluation, timeout performed, IV checked, risks and benefits discussed and monitors and equipment checked Spinal Block Patient position: sitting Prep: site prepped and draped and DuraPrep Patient monitoring: heart rate, cardiac monitor, continuous pulse ox and blood pressure Approach: midline Location: L4-5 Injection technique: single-shot Needle Needle type: Pencan  Needle gauge: 24 G Needle length: 10 cm Assessment Sensory level: T6

## 2018-04-01 NOTE — Progress Notes (Signed)
Care Plan Notes 01/01/2018 to 04/01/2018       Care Plan by Leonides Grills A at 03/18/2018  1:28 PM    Date of Service   Author Author Type Status Note Type File Time  03/18/2018  Michael Huffman  Signed Care Plan 03/18/2018             Ortho Bundle R TKA scheduled on 04-01-18 DCP:  Home with spouse.  2 story home with 3 ste.  DME:  Needs a RW.  Has elevated toilets and doesn't want a 3-in-1. PT:  [facility].  PT eval scheduled on 04-04-18.

## 2018-04-01 NOTE — Op Note (Signed)
NAME: Michael Huffman, Michael Huffman. MEDICAL RECORD TI:[id number] ACCOUNT [id number] DATE OF BIRTH:1951-06-02 FACILITY: [facility] LOCATION: [facility] PHYSICIAN:STEVEN Orlena Sheldon, MD  OPERATIVE REPORT  DATE OF PROCEDURE:  04/01/2018  PREOPERATIVE DIAGNOSIS:  Right knee end-stage osteoarthritis.  POSTOPERATIVE DIAGNOSIS:  Right knee end-stage osteoarthritis.  PROCEDURE PERFORMED:  Right total knee replacement using DePuy Sigma rotating platform prosthesis.  ATTENDING SURGEON:  Esmond Plants, MD  ASSISTANT:  Darol Destine, [place], who was scrubbed in the entire procedure and necessary for satisfactory completion of surgery.    ANESTHESIA:  Adductor canal block plus spinal anesthesia was used.    ESTIMATED BLOOD LOSS:  Minimal.  FLUID REPLACEMENT:  1500 mL crystalloid.    INSTRUMENT COUNTS:  Correct.    COMPLICATIONS:  None.   TOURNIQUET TIME:  1 hour and 20 minutes at 300 mmHg.  INDICATIONS:  The patient is a 67 year old male with worsening right knee pain and dysfunction secondary to end-stage arthritis.  The patient has bone-on-bone findings on x-ray and has had progressive pain despite conservative management including  injections, modification, activity, anti-inflammatories and presents for knee replacement to restore function and eliminate pain to the right knee.  Informed consent obtained.  DESCRIPTION OF PROCEDURE:  After an adequate level of anesthesia was achieved, the patient was positioned in supine position.  Right leg was correctly identified.  A nonsterile tourniquet placed on proximal thigh.  Sterile prep and drape of the right leg  was performed.  We then elevated the leg and exsanguinated using an Esmarch bandage and elevated the tourniquet to 300 mmHg.  Timeout had been called previously to verify correct patient, correct site.  Once we had exsanguination of the tourniquet up,  we placed the knee in flexion and did a midline incision with a 10 blade scalpel with the knee in  flexion.  Dissection down through subcutaneous tissues.  We did a medial parapatellar arthrotomy with a fresh 10 blade scalpel.  We divided the lateral  patellofemoral ligaments everting the patella.  This exposed the distal femur.  There was advanced arthritis with no cartilage on the end of the femur, the proximal tibia or the posterior patella.  We then entered the distal femur with a step cut drill  and placed an intramedullary resection guide, resecting 10 mm off the distal femur set on 5 degrees of valgus for the right knee.  We then sized the knee to a size 4 anterior down and performed anterior, posterior and chamfer cuts with a 4-in-1 block.   We then released ACL, PCL, and meniscal tissue and then removed that.  We subluxed the tibia anteriorly and performed our tibial cut with an external alignment jig, resecting 2 mm off the affected medial side, a perpendicular 90 degree cut with minimal  posterior slope for this posterior cruciate substituting prosthesis.  Once that cut was performed, protecting neurovascular structures, we sized the 4.  We checked our gaps, which were symmetric at 10 mm.  We also resected the exterior posterior bone off  the posterior femoral condyle to remove bone spurs.  At this point after sizing the tibia to a size 4, we set our rotation on the tibial tray and then did our modular drill and keel punch for the final tibial preparation and impacted the trial tibia in  place.  We then did our box cut for the posterior cruciate substituting femoral prosthesis using an oscillating saw.  Once the box cut was completed, we placed our 4 right femoral component in place and  then reduced the knee with 4+10 poly.  We were  pleased with that reduction.  It felt like we probably could get a 12.5 in once the real implants went in.  We then placed the knee in extension.  Alignment was excellent.  We then resurfaced the patella going from 25 mm thickness down to a 16 mm  thickness and  drilling the lug holes for the 38 patella.  Once we had the patellar button in place, we ranged the knee and had normal patellar tracking with no-touch technique.  We removed all trial components, pulse irrigated the knee, dried the bone  well and then vacuum mixed DePuy high viscosity cement on the back table.  We then cemented the components into place, tibia, femur and the patella, placed a 10 mm poly spacer trial in place and placed the knee in extension and held until the cement was  hardened.  We removed all extraneous cement with quarter inch curved osteotome after the cement was hard.  Trialed the knee with the 12.5 poly and we were able to get that into full extension and felt like it gave [place] great flexion and stability.  We  selected the real 4, 12.5 poly insert that in place, inspecting 1 final time for any loose cement, irrigated thoroughly and then placed the knee in extension.  Ranged the knee and again the patellar tracking was excellent.  We repaired the parapatellar  arthrotomy with #1 Vicryl suture followed by 2-0 Vicryl for subcutaneous closure and 4-0 Monocryl for skin.  Steri-Strips were applied followed by sterile dressing.  The patient tolerated surgery well.  TN/NUANCE  D:04/01/2018 T:04/01/2018 JOB:[id number]

## 2018-04-01 NOTE — Plan of Care (Signed)
  Problem: Activity: Goal: Risk for activity intolerance will decrease Outcome: Progressing   Problem: Nutrition: Goal: Adequate nutrition will be maintained Outcome: Progressing   Problem: Coping: Goal: Level of anxiety will decrease Outcome: Progressing   Problem: Safety: Goal: Ability to remain free from injury will improve Outcome: Progressing   

## 2018-04-01 NOTE — Progress Notes (Signed)
CSW consult-SNF placement  Plan: Home CSW signing off.   Kathrin Greathouse, Marlinda Mike, MSW Clinical Social Worker  [phone] 04/01/2018  11:42 AM

## 2018-04-01 NOTE — Interval H&P Note (Signed)
History and Physical Interval Note:  04/01/2018 7:33 AM  Michael Huffman  has presented today for surgery, with the diagnosis of Right knee end stage osteoarthritis  The various methods of treatment have been discussed with the patient and family. After consideration of risks, benefits and other options for treatment, the patient has consented to  Procedure(s): RIGHT TOTAL KNEE ARTHROPLASTY (Right) as a surgical intervention .  The patient's history has been reviewed, patient examined, no change in status, stable for surgery.  I have reviewed the patient's chart and labs.  Questions were answered to the patient's satisfaction.     Michael Huffman,STEVEN R

## 2018-04-01 NOTE — Anesthesia Procedure Notes (Signed)
Anesthesia Regional Block: Adductor canal block   Pre-Anesthetic Checklist: ,, timeout performed, Correct Patient, Correct Site, Correct Laterality, Correct Procedure, Correct Position, site marked, Risks and benefits discussed,  Surgical consent,  Pre-op evaluation,  At surgeon's request and post-op pain management  Laterality: Lower and Right  Prep: chloraprep       Needles:  Injection technique: Single-shot  Needle Type: Echogenic Stimulator Needle          Additional Needles:   Procedures:,,,, ultrasound used (permanent image in chart),,,,  Narrative:  Start time: 04/01/2018 7:24 AM End time: 04/01/2018 7:27 AM Injection made incrementally with aspirations every 5 mL.  Performed by: Personally  Anesthesiologist: Oleta Mouse, MD  Additional Notes: H+P and labs reviewed, risks and benefits discussed with patient, procedure tolerated well without complications

## 2018-04-01 NOTE — Brief Op Note (Signed)
04/01/2018  9:51 AM  PATIENT:  Michael Huffman  67 y.o. male  PRE-OPERATIVE DIAGNOSIS:  Right knee end stage osteoarthritis  POST-OPERATIVE DIAGNOSIS:  Right knee end stage osteoarthritis  PROCEDURE:  Procedure(s): RIGHT TOTAL KNEE ARTHROPLASTY (Right) DePuy Sigma RP  SURGEON:  Surgeon(s) and Role:    Netta Cedars, MD - Primary  PHYSICIAN ASSISTANT:   ASSISTANTS: [place] Bruns, PA-C   ANESTHESIA:   regional and spinal  EBL:  50 mL   BLOOD ADMINISTERED:none  DRAINS: none   LOCAL MEDICATIONS USED:  NONE  SPECIMEN:  No Specimen  DISPOSITION OF SPECIMEN:  N/A  COUNTS:  YES  TOURNIQUET:   Total Tourniquet Time Documented: Thigh (Right) - 93 minutes Total: Thigh (Right) - 93 minutes   DICTATION: .Other Dictation: Dictation Number [id number]  PLAN OF CARE: Admit to inpatient   PATIENT DISPOSITION:  PACU - hemodynamically stable.   Delay start of Pharmacological VTE agent (>24hrs) due to surgical blood loss or risk of bleeding: no

## 2018-04-01 NOTE — Anesthesia Preprocedure Evaluation (Signed)
Anesthesia Evaluation  Patient identified by MRN, date of birth, ID band Patient awake    Reviewed: Allergy & Precautions, NPO status , Patient's Chart, lab work & pertinent test results  History of Anesthesia Complications Negative for: history of anesthetic complications  Airway Mallampati: III  TM Distance: >3 FB Neck ROM: Full    Dental  (+) Teeth Intact   Pulmonary neg shortness of breath, sleep apnea , neg COPD, neg recent URI, former smoker,    breath sounds clear to auscultation       Cardiovascular hypertension, Pt. on medications (-) angina(-) Past MI and (-) CHF  Rhythm:Regular     Neuro/Psych negative neurological ROS     GI/Hepatic Neg liver ROS, hiatal hernia, GERD  Medicated and Controlled,  Endo/Other  Morbid obesity  Renal/GU Renal disease     Musculoskeletal  (+) Arthritis ,   Abdominal   Peds  Hematology negative hematology ROS (+)   Anesthesia Other Findings   Reproductive/Obstetrics                             Anesthesia Physical Anesthesia Plan  ASA: II  Anesthesia Plan: MAC, Spinal and Regional   Post-op Pain Management:    Induction:   PONV Risk Score and Plan: 1 and Treatment may vary due to age or medical condition  Airway Management Planned: Nasal Cannula  Additional Equipment:   Intra-op Plan:   Post-operative Plan:   Informed Consent: I have reviewed the patients History and Physical, chart, labs and discussed the procedure including the risks, benefits and alternatives for the proposed anesthesia with the patient or authorized representative who has indicated his/her understanding and acceptance.   Dental advisory given  Plan Discussed with: CRNA and Surgeon  Anesthesia Plan Comments:         Anesthesia Quick Evaluation

## 2018-04-01 NOTE — Transfer of Care (Signed)
Immediate Anesthesia Transfer of Care Note  Patient: Michael Huffman  Procedure(s) Performed: Procedure(s): RIGHT TOTAL KNEE ARTHROPLASTY (Right)  Patient Location: PACU  Anesthesia Type:Spinal  Level of Consciousness:  sedated, patient cooperative and responds to stimulation  Airway & Oxygen Therapy:Patient Spontanous Breathing and Patient connected to face mask oxgen  Post-op Assessment:  Report given to PACU RN and Post -op Vital signs reviewed and stable  Post vital signs:  Reviewed and stable  Last Vitals:  Vitals:   04/01/18 0730 04/01/18 1000  BP: 131/65 118/65  Pulse: (!) 57 63  Resp: 15 15  Temp:    SpO2: 25% 366%    Complications: No apparent anesthesia complications

## 2018-04-01 NOTE — Anesthesia Postprocedure Evaluation (Signed)
Anesthesia Post Note  Patient: Michael Huffman  Procedure(s) Performed: RIGHT TOTAL KNEE ARTHROPLASTY (Right Knee)     Patient location during evaluation: PACU Anesthesia Type: Regional, MAC and Spinal Level of consciousness: awake and alert Pain management: pain level controlled Vital Signs Assessment: post-procedure vital signs reviewed and stable Respiratory status: spontaneous breathing, nonlabored ventilation, respiratory function stable and patient connected to nasal cannula oxygen Cardiovascular status: stable and blood pressure returned to baseline Postop Assessment: no apparent nausea or vomiting and spinal receding Anesthetic complications: no    Last Vitals:  Vitals:   04/01/18 1302 04/01/18 1423  BP: 114/63 124/70  Pulse: 62 62  Resp: 16 16  Temp: 36.7 C 36.5 C  SpO2: 97% 96%    Last Pain:  Vitals:   04/01/18 1423  TempSrc: Oral  PainSc:                  Cela Newcom

## 2018-04-02 LAB — BASIC METABOLIC PANEL
Anion gap: 8 (ref 5–15)
BUN: 15 mg/dL (ref 8–23)
CO2: 30 mmol/L (ref 22–32)
Calcium: 9.1 mg/dL (ref 8.9–10.3)
Chloride: 102 mmol/L (ref 98–111)
Creatinine, Ser: 0.86 mg/dL (ref 0.61–1.24)
GFR calc Af Amer: >60 mL/min (ref >60)
GFR calc non Af Amer: >60 mL/min (ref >60)
Glucose, Bld: 144 mg/dL — ABNORMAL HIGH (ref 70–99)
Potassium: 4.1 mmol/L (ref 3.5–5.1)
Sodium: 140 mmol/L (ref 135–145)

## 2018-04-02 LAB — CBC
HCT: 45.2 % (ref 39.0–52.0)
Hemoglobin: 14.8 g/dL (ref 13.0–17.0)
MCH: 28.5 pg (ref 26.0–34.0)
MCHC: 32.7 g/dL (ref 30.0–36.0)
MCV: 87.1 fL (ref 78.0–100.0)
Platelets: 293 10*3/uL (ref 150–400)
RBC: 5.19 MIL/uL (ref 4.22–5.81)
RDW: 13 % (ref 11.5–15.5)
WBC: 13.1 10*3/uL — ABNORMAL HIGH (ref 4.0–10.5)

## 2018-04-02 NOTE — Progress Notes (Signed)
Physical Therapy Treatment Patient Details Name: Michael Huffman MRN: [id number] DOB: Jan 22, 1951 Today's Date: 04/02/2018    History of Present Illness s/p R TKA    PT Comments    Pt progressing with mobility and HEP/ROM although generally does not feel well; states he is very cold but has no temp, some abdominal discomfort; continue PT POC   Follow Up Recommendations  Follow surgeon's recommendation for DC plan and follow-up therapies     Equipment Recommendations  Rolling walker with 5" wheels    Recommendations for Other Services       Precautions / Restrictions Precautions Precautions: Knee Required Braces or Orthoses: Knee Immobilizer - Right Knee Immobilizer - Right: On except when in CPM Restrictions Weight Bearing Restrictions: No Other Position/Activity Restrictions: WBAT    Mobility  Bed Mobility Overal bed mobility: Needs Assistance Bed Mobility: Sit to Supine     Supine to sit: Min assist Sit to supine: Min assist   General bed mobility comments: assist with RLE  Transfers Overall transfer level: Needs assistance Equipment used: Rolling walker (2 wheeled) Transfers: Sit to/from Stand Sit to Stand: Min guard         General transfer comment: cues for hand placement and RLE position;   Ambulation/Gait Ambulation/Gait assistance: Min guard Gait Distance (Feet): 80 Feet Assistive device: Rolling walker (2 wheeled) Gait Pattern/deviations: Step-to pattern;Decreased weight shift to right     General Gait Details: cues for sequence and RW position   Stairs             Wheelchair Mobility    Modified Rankin (Stroke Patients Only)       Balance                                            Cognition Arousal/Alertness: Awake/alert Behavior During Therapy: WFL for tasks assessed/performed Overall Cognitive Status: Within Functional Limits for tasks assessed                                         Exercises Total Joint Exercises Ankle Circles/Pumps: AROM;Both;10 reps Quad Sets: Both;AROM;10 reps Heel Slides: AAROM;Right;10 reps Hip ABduction/ADduction: AROM;Right;10 reps Straight Leg Raises: AAROM;Right;10 reps Goniometric ROM: ~  5* to 75* AAROM right knee flexion    General Comments        Pertinent Vitals/Pain Pain Assessment: 0-10 Pain Score: 4  Pain Location: right knee Pain Descriptors / Indicators: Sore;Grimacing;Guarding Pain Intervention(s): Limited activity within patient's tolerance;Monitored during session;Repositioned    Home Living Family/patient expects to be discharged to:: Private residence Living Arrangements: Spouse/significant other Available Help at Discharge: Family Type of Home: House Home Access: Stairs to enter Entrance Stairs-Rails: Right Home Layout: Two level;Able to live on main level with bedroom/bathroom Home Equipment: None      Prior Function Level of Independence: Independent          PT Goals (current goals can now be found in the care plan section) Acute Rehab PT Goals Patient Stated Goal: home PT Goal Formulation: With patient Time For Goal Achievement: 04/09/18 Potential to Achieve Goals: Good Progress towards PT goals: Progressing toward goals    Frequency    7X/week      PT Plan Current plan remains appropriate    Co-evaluation  AM-PAC PT "6 Clicks" Daily Activity  Outcome Measure  Difficulty turning over in bed (including adjusting bedclothes, sheets and blankets)?: Unable Difficulty moving from lying on back to sitting on the side of the bed? : Unable Difficulty sitting down on and standing up from a chair with arms (e.g., wheelchair, bedside commode, etc,.)?: Unable Help needed moving to and from a bed to chair (including a wheelchair)?: A Little Help needed walking in hospital room?: A Little Help needed climbing 3-5 steps with a railing? : A Lot 6 Click Score: 11    End of Session  Equipment Utilized During Treatment: Gait belt;Right knee immobilizer Activity Tolerance: Patient tolerated treatment well Patient left: in bed;with family/visitor present;with call bell/phone within reach;with bed alarm set   PT Visit Diagnosis: Difficulty in walking, not elsewhere classified (R26.2)     Time: 7253-6644 PT Time Calculation (min) (ACUTE ONLY): 41 min  Charges:  $Gait Training: 23-37 mins $Therapeutic Exercise: 8-22 mins                    G CodesKenyon Ana, PT Pager: [phone] 04/02/2018    [facility] 04/02/2018, 4:49 PM

## 2018-04-02 NOTE — Progress Notes (Addendum)
   Subjective: 1 Day Post-Op Procedure(s) (LRB): RIGHT TOTAL KNEE ARTHROPLASTY (Right)  Mild to moderate soreness this morning Denies any new symptoms or issues Ready for therapy today Patient reports pain as moderate.  Objective:   VITALS:   Vitals:   04/02/18 0144 04/02/18 0559  BP: 139/82 (!) 148/68  Pulse: 71 67  Resp: 16 16  Temp: 97.9 F (36.6 C) 97.7 F (36.5 C)  SpO2: 98% 97%    Right knee dressing intact nv intact distally No rashes or edema  LABS Recent Labs    04/02/18 0450  HGB 14.8  HCT 45.2  WBC 13.1*  PLT 293    Recent Labs    04/02/18 0450  NA 140  K 4.1  BUN 15  CREATININE 0.86  GLUCOSE 144*     Assessment/Plan: 1 Day Post-Op Procedure(s) (LRB): RIGHT TOTAL KNEE ARTHROPLASTY (Right) PT/OT Pain management  Pulmonary toilet D/c home when cleared from therapy standpoint    Kathrynn Speed, [place] is now [facility]  [place] [address] [place], [place],  [address] Phone: [phone] [email or web address] Facebook  [employer]     I have seen the patient and agree with the plan. Esmond Plants, MD

## 2018-04-02 NOTE — Care Management Note (Signed)
Case Management Note  Patient Details  Name: ALLIE GERHOLD MRN: [id number] Date of Birth: 03/18/1951  Subjective/Objective: 67 yo M s/p R TKA                   Action/Plan: Received referral to assist with [facility] and DME   Expected Discharge Date:    04/03/18              Expected Discharge Plan:  [place]  In-House Referral:     Discharge planning Services  CM Consult  Post Acute Care Choice:    Choice offered to:     DME Arranged:  Walker rolling DME Agency:  [place]:  PT [facility] Agency:  [facility])  Status of Service:  In process, will continue to follow  If discussed at Long Length of Stay Meetings, dates discussed:    Additional Comments: Met with pt. He plans to return home with the support of his wife. He needs a RW. Discussed HH. He reports that he has an appointment on Tuesday for outpt rehab, but he prefers [facility]. Referral made to [facility] for [place] prior to surgery and he agrees with the agency if MD agrees with [facility]. He will discuss [place] vs outpt rehab with MD. Kandice Robinsons Reggie at [place] for DME referral. Contacted Tiffany at [place] [facility] and made her aware that pt is not sure if he is going to outpt rehab. Will continue to f/u to assist with the D/C plan.   Norina Buzzard, RN 04/02/2018, 10:39 AM

## 2018-04-02 NOTE — Evaluation (Signed)
Physical Therapy Evaluation Patient Details Name: Michael Huffman MRN: [id number] DOB: 11-Dec-1950 Today's Date: 04/02/2018   History of Present Illness  s/p R TKA  Clinical Impression  Pt is s/p TKA resulting in the deficits listed below (see PT Problem List). Pt with some dizziness this am limiting gait, BP stable, dizziness improved with time; will continue to monitor and follow in acute setting Pt will benefit from skilled PT to increase their independence and safety with mobility to allow discharge to the venue listed below.      Follow Up Recommendations Follow surgeon's recommendation for DC plan and follow-up therapies    Equipment Recommendations  Rolling walker with 5" wheels    Recommendations for Other Services       Precautions / Restrictions Precautions Precautions: Knee Required Braces or Orthoses: Knee Immobilizer - Right Knee Immobilizer - Right: On except when in CPM Restrictions Other Position/Activity Restrictions: WBAT      Mobility  Bed Mobility Overal bed mobility: Needs Assistance Bed Mobility: Supine to Sit     Supine to sit: Min assist     General bed mobility comments: assist with RLE  Transfers Overall transfer level: Needs assistance Equipment used: Rolling walker (2 wheeled) Transfers: Sit to/from Stand Sit to Stand: Min assist;From elevated surface         General transfer comment: cues for hand placement and RLE position; dizzy upon sitting, BP 167/79  Ambulation/Gait Ambulation/Gait assistance: Min guard;Min assist Gait Distance (Feet): 50 Feet Assistive device: Rolling walker (2 wheeled) Gait Pattern/deviations: Step-to pattern;Decreased weight shift to right     General Gait Details: cues for sequence adn Rw position  Stairs            Wheelchair Mobility    Modified Rankin (Stroke Patients Only)       Balance                                             Pertinent Vitals/Pain Pain  Assessment: 0-10 Pain Score: 4  Pain Location: right knee Pain Descriptors / Indicators: Sore Pain Intervention(s): Limited activity within patient's tolerance;Monitored during session;Repositioned;Ice applied    Home Living Family/patient expects to be discharged to:: Private residence Living Arrangements: Spouse/significant other Available Help at Discharge: Family Type of Home: House Home Access: Stairs to enter Entrance Stairs-Rails: Right Entrance Stairs-Number of Steps: 4-5 Home Layout: Two level;Able to live on main level with bedroom/bathroom Home Equipment: None      Prior Function Level of Independence: Independent               Hand Dominance        Extremity/Trunk Assessment   Upper Extremity Assessment Upper Extremity Assessment: Overall WFL for tasks assessed    Lower Extremity Assessment RLE Deficits / Details: ankle WFL; knee AAROM  grossly 7* to 55* flexion; knee extension and hip flexion 2+/5       Communication   Communication: No difficulties  Cognition Arousal/Alertness: Awake/alert Behavior During Therapy: WFL for tasks assessed/performed Overall Cognitive Status: Within Functional Limits for tasks assessed                                        General Comments      Exercises Total Joint Exercises Ankle Circles/Pumps: AROM;Both;10 reps  Quad Sets: Both;AROM;10 reps   Assessment/Plan    PT Assessment Patient needs continued PT services  PT Problem List Decreased strength;Decreased range of motion;Decreased activity tolerance;Decreased mobility;Pain;Decreased knowledge of use of DME       PT Treatment Interventions DME instruction;Gait training;Functional mobility training;Therapeutic activities;Therapeutic exercise;Patient/family education;Stair training    PT Goals (Current goals can be found in the Care Plan section)  Acute Rehab PT Goals Patient Stated Goal: home PT Goal Formulation: With patient Time For  Goal Achievement: 04/09/18 Potential to Achieve Goals: Good    Frequency 7X/week   Barriers to discharge        Co-evaluation               AM-PAC PT "6 Clicks" Daily Activity  Outcome Measure Difficulty turning over in bed (including adjusting bedclothes, sheets and blankets)?: Unable Difficulty moving from lying on back to sitting on the side of the bed? : Unable Difficulty sitting down on and standing up from a chair with arms (e.g., wheelchair, bedside commode, etc,.)?: Unable Help needed moving to and from a bed to chair (including a wheelchair)?: A Little Help needed walking in hospital room?: A Little Help needed climbing 3-5 steps with a railing? : A Lot 6 Click Score: 11    End of Session Equipment Utilized During Treatment: Gait belt Activity Tolerance: Patient tolerated treatment well Patient left: in chair;with call bell/phone within reach;with chair alarm set   PT Visit Diagnosis: Difficulty in walking, not elsewhere classified (R26.2)    Time: 2330-0762 PT Time Calculation (min) (ACUTE ONLY): 24 min   Charges:   PT Evaluation $PT Eval Low Complexity: 1 Low PT Treatments $Gait Training: 8-22 mins   PT G CodesKenyon Ana, PT Pager: [phone] 04/02/2018    Kenyon Ana 04/02/2018, 1:52 PM

## 2018-04-03 ENCOUNTER — Inpatient Hospital Stay (HOSPITAL_COMMUNITY): Payer: Medicare Other

## 2018-04-03 LAB — CBC
HCT: 43.5 % (ref 39.0–52.0)
Hemoglobin: 14.2 g/dL (ref 13.0–17.0)
MCH: 28.6 pg (ref 26.0–34.0)
MCHC: 32.6 g/dL (ref 30.0–36.0)
MCV: 87.7 fL (ref 78.0–100.0)
Platelets: 254 10*3/uL (ref 150–400)
RBC: 4.96 MIL/uL (ref 4.22–5.81)
RDW: 13.2 % (ref 11.5–15.5)
WBC: 10.7 10*3/uL — ABNORMAL HIGH (ref 4.0–10.5)

## 2018-04-03 NOTE — Discharge Instructions (Addendum)
Ice to the knee as much as you can.  Elevate the leg by propping under the ankle NOT the knee.  Do not prop under the knee for any reason as it will make your knee stiff.   Please wear the knee immobilizer at night to maintain your knee straightening ability.  Ok to put full weight on the right leg.  Do exercises as instructed throughout the day.   VERY IMPORTANT! Wear stockings on both legs 24x7,  Chew baby aspirin twice daily for 30 days to prevent blood clots.  Follow up with Dr Veverly Fells in the office in two weeks, call [phone] for appt.  INSTRUCTIONS AFTER JOINT REPLACEMENT   o Remove items at home which could result in a fall. This includes throw rugs or furniture in walking pathways o ICE to the affected joint every three hours while awake for 30 minutes at a time, for at least the first 3-5 days, and then as needed for pain and swelling.  Continue to use ice for pain and swelling. You may notice swelling that will progress down to the foot and ankle.  This is normal after surgery.  Elevate your leg when you are not up walking on it.   o Continue to use the breathing machine you got in the hospital (incentive spirometer) which will help keep your temperature down.  It is common for your temperature to cycle up and down following surgery, especially at night when you are not up moving around and exerting yourself.  The breathing machine keeps your lungs expanded and your temperature down.   DIET:  As you were doing prior to hospitalization, we recommend a well-balanced diet.  DRESSING / WOUND CARE / SHOWERING  Keep the surgical dressing until follow up.  The dressing is water proof, so you can shower without any extra covering.  IF THE DRESSING FALLS OFF or the wound gets wet inside, change the dressing with sterile gauze.  Please use good hand washing techniques before changing the dressing.  Do not use any lotions or creams on the incision until instructed by your surgeon.     ACTIVITY  o Increase activity slowly as tolerated, but follow the weight bearing instructions below.   o No driving for 6 weeks or until further direction given by your physician.  You cannot drive while taking narcotics.  o No lifting or carrying greater than 10 lbs. until further directed by your surgeon. o Avoid periods of inactivity such as sitting longer than an hour when not asleep. This helps prevent blood clots.  o You may return to work once you are authorized by your doctor.     WEIGHT BEARING   Weight bearing as tolerated with assist device (walker, cane, etc) as directed, use it as long as suggested by your surgeon or therapist, typically at least 4-6 weeks.   EXERCISES  Results after joint replacement surgery are often greatly improved when you follow the exercise, range of motion and muscle strengthening exercises prescribed by your doctor. Safety measures are also important to protect the joint from further injury. Any time any of these exercises cause you to have increased pain or swelling, decrease what you are doing until you are comfortable again and then slowly increase them. If you have problems or questions, call your caregiver or physical therapist for advice.   Rehabilitation is important following a joint replacement. After just a few days of immobilization, the muscles of the leg can become weakened and shrink (  atrophy).  These exercises are designed to build up the tone and strength of the thigh and leg muscles and to improve motion. Often times heat used for twenty to thirty minutes before working out will loosen up your tissues and help with improving the range of motion but do not use heat for the first two weeks following surgery (sometimes heat can increase post-operative swelling).   These exercises can be done on a training (exercise) mat, on the floor, on a table or on a bed. Use whatever works the best and is most comfortable for you.    Use music or  television while you are exercising so that the exercises are a pleasant break in your day. This will make your life better with the exercises acting as a break in your routine that you can look forward to.   Perform all exercises about fifteen times, three times per day or as directed.  You should exercise both the operative leg and the other leg as well.  Exercises include:    Quad Sets - Tighten up the muscle on the front of the thigh (Quad) and hold for 5-10 seconds.    Straight Leg Raises - With your knee straight (if you were given a brace, keep it on), lift the leg to 60 degrees, hold for 3 seconds, and slowly lower the leg.  Perform this exercise against resistance later as your leg gets stronger.   Leg Slides: Lying on your back, slowly slide your foot toward your buttocks, bending your knee up off the floor (only go as far as is comfortable). Then slowly slide your foot back down until your leg is flat on the floor again.   Angel Wings: Lying on your back spread your legs to the side as far apart as you can without causing discomfort.   Hamstring Strength:  Lying on your back, push your heel against the floor with your leg straight by tightening up the muscles of your buttocks.  Repeat, but this time bend your knee to a comfortable angle, and push your heel against the floor.  You may put a pillow under the heel to make it more comfortable if necessary.   A rehabilitation program following joint replacement surgery can speed recovery and prevent re-injury in the future due to weakened muscles. Contact your doctor or a physical therapist for more information on knee rehabilitation.    CONSTIPATION  Constipation is defined medically as fewer than three stools per week and severe constipation as less than one stool per week.  Even if you have a regular bowel pattern at home, your normal regimen is likely to be disrupted due to multiple reasons following surgery.  Combination of anesthesia,  postoperative narcotics, change in appetite and fluid intake all can affect your bowels.   YOU MUST use at least one of the following options; they are listed in order of increasing strength to get the job done.  They are all available over the counter, and you may need to use some, POSSIBLY even all of these options:    Drink plenty of fluids (prune juice may be helpful) and high fiber foods Colace 100 mg by mouth twice a day  Senokot for constipation as directed and as needed Dulcolax (bisacodyl), take with full glass of water  Miralax (polyethylene glycol) once or twice a day as needed.  If you have tried all these things and are unable to have a bowel movement in the first 3-4 days after surgery  call either your surgeon or your primary doctor.    If you experience loose stools or diarrhea, hold the medications until you stool forms back up.  If your symptoms do not get better within 1 week or if they get worse, check with your doctor.  If you experience "the worst abdominal pain ever" or develop nausea or vomiting, please contact the office immediately for further recommendations for treatment.   ITCHING:  If you experience itching with your medications, try taking only a single pain pill, or even half a pain pill at a time.  You can also use Benadryl over the counter for itching or also to help with sleep.   TED HOSE STOCKINGS:  Use stockings on both legs until for at least 2 weeks or as directed by physician office. They may be removed at night for sleeping.  MEDICATIONS:  See your medication summary on the After Visit Summary that nursing will review with you.  You may have some home medications which will be placed on hold until you complete the course of blood thinner medication.  It is important for you to complete the blood thinner medication as prescribed.  PRECAUTIONS:  If you experience chest pain or shortness of breath - call 911 immediately for transfer to the hospital emergency  department.   If you develop a fever greater that 101 F, purulent drainage from wound, increased redness or drainage from wound, foul odor from the wound/dressing, or calf pain - CONTACT YOUR SURGEON.                                                   FOLLOW-UP APPOINTMENTS:  If you do not already have a post-op appointment, please call the office for an appointment to be seen by your surgeon.  Guidelines for how soon to be seen are listed in your After Visit Summary, but are typically between 1-4 weeks after surgery.  OTHER INSTRUCTIONS:   Knee Replacement:  Do not place pillow under knee, focus on keeping the knee straight while resting. CPM instructions: 0-90 degrees, 2 hours in the morning, 2 hours in the afternoon, and 2 hours in the evening. Place foam block, curve side up under heel at all times except when in CPM or when walking.  DO NOT modify, tear, cut, or change the foam block in any way.  MAKE SURE YOU:   Understand these instructions.   Get help right away if you are not doing well or get worse.    Thank you for letting us be a part of your medical care team.  It is a privilege we respect greatly.  We hope these instructions will help you stay on track for a fast and full recovery!

## 2018-04-03 NOTE — Progress Notes (Signed)
Pt. States he can place cpap on himself when ready. RT informed pt. To notify if he needed any assistance.

## 2018-04-03 NOTE — Progress Notes (Signed)
I have Reviewed the results of the abdominal x-ray.  He has gas pattern consistent with likely postoperative ileus.  Currently he is not complaining of any nausea or vomiting.    I have briefly discussed the case with general surgery attending on call.  At this time is not a surgical abdomen and there is no need for formal consultation but the recommendations are to maintain clear fluids and to not advance his diet.  Also to make sure he is walking the halls and up out of bed as much as possible.  We will also recheck his abdominal x-ray in the morning and recheck his electrolytes to look for any hypokalemia or hypomagnesemia.  Should he develop nausea or vomiting he would need a nasogastric tube.  The General Surgeons would like to be contacted if that becomes the case.

## 2018-04-03 NOTE — Progress Notes (Addendum)
Physical Therapy Treatment Patient Details Name: Michael Huffman MRN: [id number] DOB: 1951/05/29 Today's Date: 04/03/2018    History of Present Illness s/p R TKA;   abd xray=Probable colonic ileus.     PT Comments    Pt progressing, less dizziness today with amb, feeling a little better, although still some c/o abdominal discomfort; will see again this pm to work on HEP  Follow Up Recommendations  Follow surgeon's recommendation for DC plan and follow-up therapies     Equipment Recommendations  Rolling walker with 5" wheels    Recommendations for Other Services       Precautions / Restrictions Precautions Precautions: Knee Required Braces or Orthoses: Knee Immobilizer - Right Knee Immobilizer - Right: On except when in CPM Restrictions Weight Bearing Restrictions: No Other Position/Activity Restrictions: WBAT    Mobility  Bed Mobility Overal bed mobility: Needs Assistance Bed Mobility: Sit to Supine     Supine to sit: Min assist        Transfers Overall transfer level: Needs assistance Equipment used: Rolling walker (2 wheeled) Transfers: Sit to/from Stand Sit to Stand: Min guard;Supervision            Ambulation/Gait Ambulation/Gait assistance: Counsellor (Feet): 90 Feet Assistive device: Rolling walker (2 wheeled) Gait Pattern/deviations: Step-to pattern;Decreased weight shift to right     General Gait Details: cues for sequence and RW position   Stairs             Wheelchair Mobility    Modified Rankin (Stroke Patients Only)       Balance                                            Cognition Arousal/Alertness: Awake/alert Behavior During Therapy: WFL for tasks assessed/performed Overall Cognitive Status: Within Functional Limits for tasks assessed                                        Exercises Total Joint Exercises Ankle Circles/Pumps: AROM;Both;10 reps Quad Sets:  Both;AROM;10 reps    General Comments        Pertinent Vitals/Pain Pain Assessment: 0-10 Pain Score: 5  Pain Location: right knee Pain Descriptors / Indicators: Sore;Grimacing;Guarding    Home Living                      Prior Function            PT Goals (current goals can now be found in the care plan section) Acute Rehab PT Goals Patient Stated Goal: home PT Goal Formulation: With patient Time For Goal Achievement: 04/09/18 Potential to Achieve Goals: Good Progress towards PT goals: Progressing toward goals    Frequency    7X/week      PT Plan Current plan remains appropriate    Co-evaluation              AM-PAC PT "6 Clicks" Daily Activity  Outcome Measure  Difficulty turning over in bed (including adjusting bedclothes, sheets and blankets)?: Unable Difficulty moving from lying on back to sitting on the side of the bed? : Unable Difficulty sitting down on and standing up from a chair with arms (e.g., wheelchair, bedside commode, etc,.)?: Unable Help needed moving to and from a bed to chair (  including a wheelchair)?: A Little Help needed walking in hospital room?: A Little Help needed climbing 3-5 steps with a railing? : A Lot 6 Click Score: 11    End of Session Equipment Utilized During Treatment: Gait belt;Right knee immobilizer Activity Tolerance: Patient tolerated treatment well Patient left: in chair;with call bell/phone within reach;with chair alarm set   PT Visit Diagnosis: Difficulty in walking, not elsewhere classified (R26.2)     Time: 9379-0240 PT Time Calculation (min) (ACUTE ONLY): 25 min  Charges:  $Gait Training: 23-37 mins                    G CodesKenyon Ana, PT Pager: [phone] 04/03/2018    Kenyon Ana 04/03/2018, 2:16 PM

## 2018-04-03 NOTE — Progress Notes (Signed)
Subjective: 2 Days Post-Op Procedure(s) (LRB): RIGHT TOTAL KNEE ARTHROPLASTY (Right) Patient reports pain as mild.   Patient seen in rounds for Dr. Veverly Fells. Patient is having problems with constipation. He reports that he has had "uncomfortable gas" in his belly since yesterday. Difficulty passing gas. No BM since surgery. Denies nausea. Vomited after taking PO meds this morning. Voiding well. Reports that the knee feels ok this morning.    Objective: Vital signs in last 24 hours: Temp:  [97.7 F (36.5 C)-98.7 F (37.1 C)] 98.7 F (37.1 C) (07/14 0621) Pulse Rate:  [59-81] 81 (07/14 0621) Resp:  [14-16] 16 (07/14 0621) BP: (130-146)/(71-100) 134/78 (07/14 0621) SpO2:  [93 %-98 %] 93 % (07/14 0621)  Intake/Output from previous day:  Intake/Output Summary (Last 24 hours) at 04/03/2018 0850 Last data filed at 04/02/2018 2153 Gross per 24 hour  Intake 597.5 ml  Output 400 ml  Net 197.5 ml     Labs: Recent Labs    04/02/18 0450 04/03/18 0459  HGB 14.8 14.2   Recent Labs    04/02/18 0450 04/03/18 0459  WBC 13.1* 10.7*  RBC 5.19 4.96  HCT 45.2 43.5  PLT 293 254   Recent Labs    04/02/18 0450  NA 140  K 4.1  CL 102  CO2 30  BUN 15  CREATININE 0.86  GLUCOSE 144*  CALCIUM 9.1    EXAM General - Patient is Alert and Oriented Extremity - Neurologically intact Neurovascular intact Intact pulses distally Dorsiflexion/Plantar flexion intact No cellulitis present Compartment soft Dressing/Incision - clean, dry, no drainage Motor Function - intact, moving foot and toes well on exam.  No abdominal distention noted. Bowel sounds normal in all quadrants.   Past Medical History:  Diagnosis Date  . Chronically dry eyes, bilateral   . GERD (gastroesophageal reflux disease)   . Hiatal hernia   . History of colon polyps 01/12/2013   hyperplastic  . History of external beam radiation therapy    face and right thigh , completed 2006 /  right forearm ,  4000 Gy,   completed 06/ 2017  . History of kidney stones   . History of radiation therapy    of lymphoma on skin: face and leg  . Hyperlipidemia   . Hypertension   . 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 Nephrolithiasis    bilateral   . OSA on CPAP   . 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 SSBE (short-segment Barrett's esophagus)     Assessment/Plan: 2 Days Post-Op Procedure(s) (LRB): RIGHT TOTAL KNEE ARTHROPLASTY (Right) Active Problems:   Status post total knee replacement, right  Estimated body mass index is 34.02 kg/m as calculated from the following:   Height as of this encounter: 6\' 2"  (1.88 m).   Weight as of this encounter: 120.2 kg (265 lb). Up with therapy  DVT Prophylaxis - Aspirin Weight-Bearing as tolerated   Continue therapy today. Will adjust diet to clear liquids only this morning. Will get plain film of abdomen. Monitor for abdominal distension and abnormal bowel sounds. If he  progresses with his therapy and if the abdominal discomfort/constipation is relieved, possible DC home. Likely will stay today.   Ardeen Jourdain, PA-C Orthopaedic Surgery 04/03/2018, 8:50 AM

## 2018-04-03 NOTE — Progress Notes (Signed)
Physical Therapy Treatment Patient Details Name: Michael Huffman MRN: [id number] DOB: [birth year] Today's Date: 04/03/2018    History of Present Illness s/p R TKA; 04/03/18-->probable ileus per abd xray    PT Comments    Pt  Progressing slowly; continued abd discomfort and overall c/o Generalized fatigue; continue PT POC; if d/c's tomorrow will need to practice stairs;   Follow Up Recommendations  Follow surgeon's recommendation for DC plan and follow-up therapies     Equipment Recommendations  Rolling walker with 5" wheels    Recommendations for Other Services       Precautions / Restrictions Precautions Precautions: Knee Required Braces or Orthoses: Knee Immobilizer - Right Knee Immobilizer - Right: On except when in CPM Restrictions Weight Bearing Restrictions: No Other Position/Activity Restrictions: WBAT    Mobility  Bed Mobility Overal bed mobility: Needs Assistance Bed Mobility: Sit to Supine     Supine to sit: Min guard Sit to supine: Min guard   General bed mobility comments: for safety  Transfers Overall transfer level: Needs assistance Equipment used: Rolling walker (2 wheeled) Transfers: Sit to/from [facility] Sit to Stand: Min guard;Supervision Stand pivot transfers: Min guard       General transfer comment: pt self corrects for hand placement end of session, cues initially  Ambulation/Gait Ambulation/Gait assistance: Min guard;Min assist Gait Distance (Feet): 85 Feet Assistive device: Rolling walker (2 wheeled) Gait Pattern/deviations: Step-to pattern;Decreased weight shift to right     General Gait Details: cues for sequence and RW position, mild dizziness intermittently; stair training deferred d/t pt  becoming diaphoretic--RN aware, symptoms resloved once pt seated in recliner   Stairs             Wheelchair Mobility    Modified Rankin (Stroke Patients Only)       Balance                                             Cognition Arousal/Alertness: Awake/alert Behavior During Therapy: WFL for tasks assessed/performed Overall Cognitive Status: Within Functional Limits for tasks assessed                                        Exercises Total Joint Exercises Ankle Circles/Pumps: AROM;Both;10 reps Quad Sets: Both;AROM;10 reps Heel Slides: AAROM;Right;10 reps;Limitations Heel Slides Limitations: incr muscle guarding limiting flexion Hip ABduction/ADduction: AROM;Right;10 reps Straight Leg Raises: AAROM;Right;10 reps Goniometric ROM: ~ 5* to 65* right knee flexion    General Comments        Pertinent Vitals/Pain Pain Assessment: 0-10 Pain Score: 6  Pain Location: right knee Pain Descriptors / Indicators: Sore;Grimacing;Guarding Pain Intervention(s): Monitored during session;Ice applied    Home Living                      Prior Function            PT Goals (current goals can now be found in the care plan section) Acute Rehab PT Goals Patient Stated Goal: home PT Goal Formulation: With patient Time For Goal Achievement: 04/09/18 Potential to Achieve Goals: Good Progress towards PT goals: Progressing toward goals(slowly)    Frequency    7X/week      PT Plan Current plan remains appropriate    Co-evaluation  AM-PAC PT "6 Clicks" Daily Activity  Outcome Measure  Difficulty turning over in bed (including adjusting bedclothes, sheets and blankets)?: Unable Difficulty moving from lying on back to sitting on the side of the bed? : Unable Difficulty sitting down on and standing up from a chair with arms (e.g., wheelchair, bedside commode, etc,.)?: Unable Help needed moving to and from a bed to chair (including a wheelchair)?: A Little Help needed walking in hospital room?: A Little Help needed climbing 3-5 steps with a railing? : A Lot 6 Click Score: 11    End of Session Equipment Utilized During Treatment:  Gait belt;Right knee immobilizer Activity Tolerance: Patient tolerated treatment well Patient left: with call bell/phone within reach;in bed;with family/visitor present   PT Visit Diagnosis: Difficulty in walking, not elsewhere classified (R26.2)     Time: 0352-4818 PT Time Calculation (min) (ACUTE ONLY): 30 min  Charges:  $Gait Training: 8-22 mins $Therapeutic Exercise: 8-22 mins                    G CodesKenyon Ana, PT Pager: [phone] 04/03/2018    [facility] 04/03/2018, 4:17 PM

## 2018-04-04 ENCOUNTER — Inpatient Hospital Stay (HOSPITAL_COMMUNITY): Payer: Medicare Other

## 2018-04-04 LAB — CBC
HCT: 41.6 % (ref 39.0–52.0)
Hemoglobin: 13.6 g/dL (ref 13.0–17.0)
MCH: 28.9 pg (ref 26.0–34.0)
MCHC: 32.7 g/dL (ref 30.0–36.0)
MCV: 88.3 fL (ref 78.0–100.0)
Platelets: 207 10*3/uL (ref 150–400)
RBC: 4.71 MIL/uL (ref 4.22–5.81)
RDW: 13.1 % (ref 11.5–15.5)
WBC: 8.3 10*3/uL (ref 4.0–10.5)

## 2018-04-04 LAB — BASIC METABOLIC PANEL
Anion gap: 9 (ref 5–15)
BUN: 13 mg/dL (ref 8–23)
CO2: 29 mmol/L (ref 22–32)
Calcium: 8.5 mg/dL — ABNORMAL LOW (ref 8.9–10.3)
Chloride: 98 mmol/L (ref 98–111)
Creatinine, Ser: 0.74 mg/dL (ref 0.61–1.24)
GFR calc Af Amer: >60 mL/min (ref >60)
GFR calc non Af Amer: >60 mL/min (ref >60)
Glucose, Bld: 106 mg/dL — ABNORMAL HIGH (ref 70–99)
Potassium: 3.7 mmol/L (ref 3.5–5.1)
Sodium: 136 mmol/L (ref 135–145)

## 2018-04-04 MED ORDER — BISACODYL 10 MG RE SUPP
10.0000 mg | Freq: Once | RECTAL | Status: AC
  Administered 2018-04-04: 10 mg via RECTAL
  Filled 2018-04-04: qty 1

## 2018-04-04 NOTE — Progress Notes (Addendum)
Physical Therapy Treatment Patient Details Name: Michael Huffman MRN: [id number] DOB: 1951-08-29 Today's Date: 04/04/2018    History of Present Illness s/p R TKA; 04/03/18-->probable ileus per abd xray    PT Comments    POD #3 assisted patient with ambulation, stairs, and therex. Patient still unable to do SLR; wife was educated on [place] precaution as it was being applied. Patient ambulated 80 feet to stairwell. Patient did 5 steps up/dn with L railinig and single crutch on R side; wife educated on sequencing and guarding with min VC's needed. Patient ambulated back to bed and completed therex. Patient tolerated treatment well with slight increase in pain reported.    Follow Up Recommendations  Follow surgeon's recommendation for DC plan and follow-up therapies     Equipment Recommendations  Rolling walker with 5" wheels    Recommendations for Other Services       Precautions / Restrictions Precautions Precautions: Knee Required Braces or Orthoses: Knee Immobilizer - Right Knee Immobilizer - Right: On except when in CPM Restrictions Weight Bearing Restrictions: No Other Position/Activity Restrictions: WBAT    Mobility  Bed Mobility Overal bed mobility: Needs Assistance Bed Mobility: Sit to Supine     Supine to sit: Min assist Sit to supine: Min assist   General bed mobility comments: hand hold needed to elevate trunk; assist movement of R LE  Transfers Overall transfer level: Needs assistance Equipment used: Rolling walker (2 wheeled) Transfers: Sit to/from Stand Sit to Stand: Min guard;Supervision Stand pivot transfers: Min guard       General transfer comment: no VC's needed  Ambulation/Gait Ambulation/Gait assistance: Min guard Gait Distance (Feet): 160 Feet Assistive device: Rolling walker (2 wheeled) Gait Pattern/deviations: Step-to pattern;Decreased weight shift to right;Antalgic Gait velocity: decreased   General Gait Details: Knee imobilizer used due  to no SLR   Stairs Stairs: Yes Stairs assistance: Min guard Stair Management: One rail Left;Step to pattern;Forwards;With crutches Number of Stairs: 5 General stair comments: one crutch on R side; spouse educated on guarding; min VC's needed   Wheelchair Mobility    Modified Rankin (Stroke Patients Only)       Balance                                            Cognition Arousal/Alertness: Awake/alert Behavior During Therapy: WFL for tasks assessed/performed Overall Cognitive Status: Within Functional Limits for tasks assessed                                        Exercises Total Joint Exercises Ankle Circles/Pumps: AROM;Both;20 reps;Supine Quad Sets: AROM;Right;10 reps;Supine Towel Squeeze: AROM;Both;Supine;10 reps Short Arc QuadSinclair Ship;Right;Supine;5 reps Heel Slides: AROM;Right;10 reps Hip ABduction/ADduction: AROM;Right;Supine;10 reps Straight Leg Raises: AAROM;Right;10 reps;Supine    General Comments        Pertinent Vitals/Pain Pain Assessment: 0-10 Pain Score: 3  Pain Location: above R knee at surg tourniquet site Pain Descriptors / Indicators: Aching;Sore;Grimacing Pain Intervention(s): Limited activity within patient's tolerance;Repositioned;Ice applied;Monitored during session    Home Living                      Prior Function            PT Goals (current goals can now be found in the care plan  section) Progress towards PT goals: Progressing toward goals    Frequency    7X/week      PT Plan Current plan remains appropriate    Co-evaluation              AM-PAC PT "6 Clicks" Daily Activity  Outcome Measure  Difficulty turning over in bed (including adjusting bedclothes, sheets and blankets)?: A Lot Difficulty moving from lying on back to sitting on the side of the bed? : A Little Difficulty sitting down on and standing up from a chair with arms (e.g., wheelchair, bedside commode,  etc,.)?: A Lot Help needed moving to and from a bed to chair (including a wheelchair)?: A Little Help needed walking in hospital room?: A Little Help needed climbing 3-5 steps with a railing? : A Lot 6 Click Score: 15    End of Session Equipment Utilized During Treatment: Gait belt;Right knee immobilizer Activity Tolerance: Patient tolerated treatment well Patient left: in bed;with call bell/phone within reach;with family/visitor present   PT Visit Diagnosis: Difficulty in walking, not elsewhere classified (R26.2)     Time: 2706-2376 PT Time Calculation (min) (ACUTE ONLY): 32 min  Charges:  $Gait Training: 8-22 mins $Therapeutic Activity: 8-22 mins                    G Codes:      Rachel Bo, SPTA 04/04/2018, 4:20 PM   Direct supervision throughout session and agree with above  Rica Koyanagi  PTA [facility]  Acute  Rehab Pager      [phone]

## 2018-04-04 NOTE — Progress Notes (Signed)
Discharge and medication instructions reviewed with patient and wife. Questions answered and both deny further questions. Three prescriptions given to patient. Spouse is driving him home. Donne Hazel, RN

## 2018-04-04 NOTE — Care Management Important Message (Signed)
Important Message  Patient Details  Name: Michael Huffman MRN: [id number] Date of Birth: Aug 24, 1951   Medicare Important Message Given:  Yes    Kerin Salen 04/04/2018, 12:15 [place] Message  Patient Details  Name: Michael Huffman MRN: [id number] Date of Birth: 1951-08-15   Medicare Important Message Given:  Yes    Kerin Salen 04/04/2018, 12:15 PM

## 2018-04-04 NOTE — Progress Notes (Addendum)
Physical Therapy Treatment Patient Details Name: Michael Huffman MRN: [id number] DOB: 02-23-1951 Today's Date: 04/04/2018    History of Present Illness s/p R TKA; 04/03/18-->probable ileus per abd xray    PT Comments    POD #3 assisted patient with bed mobility, transfers, ambulation, and therex. Patient needed assistance to move LE from supine to sit. Patient unable to do SLR; applied knee immobilizer and educated patient on when to use. Patient taken to restroom and urinated standing into toilet. Patient was able to ambulated 160 feet with decreased gait speed and two standing rest breaks. Patient needed minor cueing to perform exercises correctly. Will finnish exercises and stairs in afternoon session when family will be present. Patient tolerated treatment well and reported a mild increase in pain. Patient asked about order for clear liquid diet; RN was notified and it was delivered at end of session.   Follow Up Recommendations  Follow surgeon's recommendation for DC plan and follow-up therapies     Equipment Recommendations  Rolling walker with 5" wheels    Recommendations for Other Services       Precautions / Restrictions Precautions Precautions: Knee Required Braces or Orthoses: Knee Immobilizer - Right Knee Immobilizer - Right: On except when in CPM Restrictions Weight Bearing Restrictions: No Other Position/Activity Restrictions: WBAT    Mobility  Bed Mobility Overal bed mobility: Needs Assistance Bed Mobility: Sit to Supine     Supine to sit: Min assist Sit to supine: Min assist   General bed mobility comments: hand hold needed to elevate trunk; assist movement of R LE  Transfers Overall transfer level: Needs assistance Equipment used: Rolling walker (2 wheeled) Transfers: Sit to/from Stand Sit to Stand: Min guard;Supervision Stand pivot transfers: Min guard       General transfer comment: no VC's needed  Ambulation/Gait Ambulation/Gait assistance:  Min guard Gait Distance (Feet): 65 Feet Assistive device: Rolling walker (2 wheeled) Gait Pattern/deviations: Step-to pattern;Decreased weight shift to right;Antalgic Gait velocity: decreased   General Gait Details: VC's needed to prevent picking up RW; Knee imobilizer used due to no SLR   Stairs             Wheelchair Mobility    Modified Rankin (Stroke Patients Only)       Balance                                            Cognition Arousal/Alertness: Awake/alert Behavior During Therapy: WFL for tasks assessed/performed Overall Cognitive Status: Within Functional Limits for tasks assessed                                        Exercises Total Joint Exercises Ankle Circles/Pumps: AROM;Both;20 reps;Supine Quad Sets: AROM;Right;10 reps;Supine Towel Squeeze: AROM;Both;Supine;10 reps Heel Slides: AROM;Right;10 reps    General Comments        Pertinent Vitals/Pain Pain Assessment: 0-10 Pain Score: 4  Pain Location: above R knee at surg tourniquet site Pain Descriptors / Indicators: Aching;Sore;Grimacing Pain Intervention(s): Limited activity within patient's tolerance;Repositioned;Ice applied;Monitored during session;Patient requesting pain meds-RN notified    Home Living                      Prior Function            PT  Goals (current goals can now be found in the care plan section) Progress towards PT goals: Progressing toward goals    Frequency    7X/week      PT Plan Current plan remains appropriate    Co-evaluation              AM-PAC PT "6 Clicks" Daily Activity  Outcome Measure  Difficulty turning over in bed (including adjusting bedclothes, sheets and blankets)?: A Lot Difficulty moving from lying on back to sitting on the side of the bed? : A Little Difficulty sitting down on and standing up from a chair with arms (e.g., wheelchair, bedside commode, etc,.)?: A Lot Help needed moving  to and from a bed to chair (including a wheelchair)?: A Little Help needed walking in hospital room?: A Little Help needed climbing 3-5 steps with a railing? : A Lot 6 Click Score: 15    End of Session Equipment Utilized During Treatment: Gait belt;Right knee immobilizer Activity Tolerance: Patient tolerated treatment well Patient left: in bed;with call bell/phone within reach   PT Visit Diagnosis: Difficulty in walking, not elsewhere classified (R26.2)     Time: 7579-7282 PT Time Calculation (min) (ACUTE ONLY): 29 min  Charges:  $Gait Training: 8-22 mins $Therapeutic Exercise: 8-22 mins                    G Codes:      Rachel Bo, SPTA 04/04/2018, 12:58 PM  Direct Supervision during session and agree with above  Rica Koyanagi  PTA [facility]  Acute  Rehab Pager      [phone]

## 2018-04-04 NOTE — Progress Notes (Signed)
Orthopedics Progress Note  Subjective: Patient feeling much better this afternoon after BM. Tolerating po well.  He would like to be discharged home.  Objective:  Vitals:   04/04/18 0556 04/04/18 1428  BP: (!) 152/87 (!) 121/59  Pulse: 79 84  Resp: 16 16  Temp: 98.9 F (37.2 C) 98.3 F (36.8 C)  SpO2: 92% (!) 88%    General: Awake and alert  Musculoskeletal: Right knee with excellent AROM. No cords. Neg Homans test Neurovascularly intact  Lab Results  Component Value Date   WBC 8.3 04/04/2018   HGB 13.6 04/04/2018   HCT 41.6 04/04/2018   MCV 88.3 04/04/2018   PLT 207 04/04/2018       Component Value Date/Time   NA 136 04/04/2018 0500   K 3.7 04/04/2018 0500   CL 98 04/04/2018 0500   CO2 29 04/04/2018 0500   GLUCOSE 106 (H) 04/04/2018 0500   BUN 13 04/04/2018 0500   CREATININE 0.74 04/04/2018 0500   CALCIUM 8.5 (L) 04/04/2018 0500   GFRNONAA >60 04/04/2018 0500   GFRAA >60 04/04/2018 0500    No results found for: INR, PROTIME  Assessment/Plan: POD #3 s/p Procedure(s): RIGHT TOTAL KNEE ARTHROPLASTY Ileus resolved  Discharge to home DVT prophylaxis 81 mg ASA BID and TED hose Outpatient PT tomorrow  Doran Heater. Veverly Fells, MD 04/04/2018 5:39 PM

## 2018-04-04 NOTE — Discharge Summary (Signed)
Orthopedic Discharge Summary        Physician Discharge Summary  Patient ID: Michael Huffman MRN: [id number] DOB/AGE: Nov 09, 1950 67 y.o.  Admit date: 04/01/2018 Discharge date: 04/04/2018   Procedures:  Procedure(s) (LRB): RIGHT TOTAL KNEE ARTHROPLASTY (Right)  Attending Physician:  Dr. Esmond Plants  Admission Diagnoses:   Right knee primary end staged OA  Discharge Diagnoses:  Right knee primary end staged OA   Past Medical History:  Diagnosis Date  . Chronically dry eyes, bilateral   . GERD (gastroesophageal reflux disease)   . Hiatal hernia   . History of colon polyps 01/12/2013   hyperplastic  . History of external beam radiation therapy    face and right thigh , completed 2006 /  right forearm ,  4000 Gy,  completed 06/ 2017  . History of kidney stones   . History of radiation therapy    of lymphoma on skin: face and leg  . Hyperlipidemia   . Hypertension   . 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 Nephrolithiasis    bilateral   . OSA on CPAP   . 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 SSBE (short-segment Barrett's esophagus)     PCP: Wenda Low, MD   Discharged Condition: good  Hospital Course:  Patient underwent the above stated procedure on 04/01/2018. Patient tolerated the procedure well and brought to the recovery room in good condition and subsequently to the floor. Therapy went very well with good AROM. Patient was mobilizing well.  Patient had a hospital course marked by the development of a post op ileus which resolved after 48 hours of  clear liquids and supportive care.  KUB improved today and BMET normal, tolerating po well. Patient was stable for discharge.   Disposition: Discharge disposition: 01-Home or Self Care      with follow up in 2 weeks   Follow-up Information    Netta Cedars, MD. Call in 2 weeks.   Specialty:  Orthopedic Surgery Why:  [phone] Contact information: [address] [place] [address] [phone]           Discharge Instructions    Call MD / Call 911   Complete by:  As directed    If you experience chest pain or shortness of breath, CALL 911 and be transported to the hospital emergency room.  If you develope a fever above 101 F, pus (white drainage) or increased drainage or redness at the wound, or calf pain, call your surgeon's office.   Call MD / Call 911   Complete by:  As directed    If you experience chest pain or shortness of breath, CALL 911 and be transported to the hospital emergency room.  If you develope a fever above 101 F, pus (white drainage) or increased drainage or redness at the wound, or calf pain, call your surgeon's office.   Constipation Prevention   Complete by:  As directed    Drink plenty of fluids.  Prune juice may be helpful.  You may use a stool softener, such as  Colace (over the counter) 100 mg twice a day.  Use MiraLax (over the counter) for constipation as needed.   Constipation Prevention   Complete by:  As directed    Drink plenty of fluids.  Prune juice may be helpful.  You may use a stool softener, such as Colace (over the counter) 100 mg twice a day.  Use MiraLax (over the counter) for constipation as needed.   Diet - low sodium heart healthy   Complete by:  As directed    Diet - low sodium heart healthy   Complete by:  As directed    Driving restrictions   Complete by:  As directed    No driving for 2 weeks   Increase activity slowly as tolerated   Complete by:  As directed    Increase activity slowly as tolerated    Complete by:  As directed       Allergies as of 04/04/2018   No Known Allergies     Medication List    STOP taking these medications   HYDROcodone-acetaminophen 5-325 MG tablet Commonly known as:  NORCO   ondansetron 4 MG tablet Commonly known as:  ZOFRAN   phenazopyridine 200 MG tablet Commonly known as:  PYRIDIUM   tamsulosin 0.4 MG Caps capsule Commonly known as:  FLOMAX     TAKE these medications   aspirin 81 MG chewable tablet Commonly known as:  ASPIRIN CHILDRENS Chew 1 tablet (81 mg total) by mouth 2 (two) times daily.   BLUE-EMU SUPER STRENGTH Crea Apply 1 application topically daily as needed (pain).   losartan-hydrochlorothiazide 100-12.5 MG tablet Commonly known as:  HYZAAR Take 1 tablet by mouth daily.   methocarbamol 500 MG tablet Commonly known as:  ROBAXIN Take 1 tablet (500 mg total) by mouth 3 (three) times daily as needed.   naproxen sodium 220 MG tablet Commonly known as:  ALEVE Take 220 mg by mouth daily as needed (pain).   OMEGA-3 + VITAMIN D3 PO Take 4 capsules by mouth daily.   omeprazole 20 MG capsule Commonly known as:  PRILOSEC Take 20 mg by mouth every evening.   oxyCODONE-acetaminophen 5-325 MG tablet Commonly known as:  PERCOCET Take 1-2 tablets by mouth every 4 (four) hours as needed for moderate pain or severe pain.   PRESERVISION/LUTEIN PO Take 2 capsules by mouth daily.   rosuvastatin 5 MG tablet Commonly known as:  CRESTOR Take 5 mg by mouth every morning.   SYSTANE OP Apply 1 drop to eye 4 (four) times daily. Each eye            Durable Medical Equipment  (From admission, onward)        Start     Ordered   04/02/18 1056  For home use only DME Walker rolling  Once    Question:  Patient needs a walker to treat with the following condition  Answer:  Surgery, elective   04/02/18 1055        Signed: Alfred Harrel,STEVEN R 04/04/2018, 5:43 PM  [facility] is now [employer] [address] [place], [place], [place] [address] Phone: [phone] Facebook  [employer]

## 2018-04-04 NOTE — Progress Notes (Signed)
Orthopedics Progress Note  Subjective: Patient feeling better today. Positive flatus  Objective:  Vitals:   04/03/18 1949 04/04/18 0556  BP: (!) 144/81 (!) 152/87  Pulse: 78 79  Resp: 16 16  Temp: 99.5 F (37.5 C) 98.9 F (37.2 C)  SpO2: 93% 92%    General: Awake and alert  Musculoskeletal: Right knee dressing intact. Abdomen soft and nontender Neurovascularly intact  Lab Results  Component Value Date   WBC 8.3 04/04/2018   HGB 13.6 04/04/2018   HCT 41.6 04/04/2018   MCV 88.3 04/04/2018   PLT 207 04/04/2018       Component Value Date/Time   NA 136 04/04/2018 0500   K 3.7 04/04/2018 0500   CL 98 04/04/2018 0500   CO2 29 04/04/2018 0500   GLUCOSE 106 (H) 04/04/2018 0500   BUN 13 04/04/2018 0500   CREATININE 0.74 04/04/2018 0500   CALCIUM 8.5 (L) 04/04/2018 0500   GFRNONAA >60 04/04/2018 0500   GFRAA >60 04/04/2018 0500    No results found for: INR, PROTIME  Assessment/Plan: POD #3 s/p Procedure(s): RIGHT TOTAL KNEE ARTHROPLASTY Post op Ileus improving. Await reading of KUB, some dilated bowel noted. Continue clear liquids for now Dulculax suppository later this morning  Doran Heater. Veverly Fells, MD 04/04/2018 7:23 AM

## 2018-04-04 NOTE — Progress Notes (Signed)
Patient is eating well and had a BM; he is asking to go home. Dr Norris's office called and voicemail left stating the same. Awaiting call back. Patient and spouse updated. Donne Hazel, RN

## 2018-04-05 DIAGNOSIS — M25561 Pain in right knee: Secondary | ICD-10-CM | POA: Diagnosis not present

## 2018-04-08 DIAGNOSIS — M25561 Pain in right knee: Secondary | ICD-10-CM | POA: Diagnosis not present

## 2018-04-12 DIAGNOSIS — M25561 Pain in right knee: Secondary | ICD-10-CM | POA: Diagnosis not present

## 2018-04-14 DIAGNOSIS — Z4789 Encounter for other orthopedic aftercare: Secondary | ICD-10-CM | POA: Diagnosis not present

## 2018-04-15 DIAGNOSIS — M25561 Pain in right knee: Secondary | ICD-10-CM | POA: Diagnosis not present

## 2018-04-19 DIAGNOSIS — M25561 Pain in right knee: Secondary | ICD-10-CM | POA: Diagnosis not present

## 2018-04-22 DIAGNOSIS — M25561 Pain in right knee: Secondary | ICD-10-CM | POA: Diagnosis not present

## 2018-04-26 DIAGNOSIS — M25561 Pain in right knee: Secondary | ICD-10-CM | POA: Diagnosis not present

## 2018-04-29 DIAGNOSIS — M25561 Pain in right knee: Secondary | ICD-10-CM | POA: Diagnosis not present

## 2018-05-03 DIAGNOSIS — M25561 Pain in right knee: Secondary | ICD-10-CM | POA: Diagnosis not present

## 2018-05-06 DIAGNOSIS — M25561 Pain in right knee: Secondary | ICD-10-CM | POA: Diagnosis not present

## 2018-05-10 DIAGNOSIS — M25561 Pain in right knee: Secondary | ICD-10-CM | POA: Diagnosis not present

## 2018-05-13 DIAGNOSIS — M25561 Pain in right knee: Secondary | ICD-10-CM | POA: Diagnosis not present

## 2018-05-18 DIAGNOSIS — M25561 Pain in right knee: Secondary | ICD-10-CM | POA: Diagnosis not present

## 2018-05-20 DIAGNOSIS — M25561 Pain in right knee: Secondary | ICD-10-CM | POA: Diagnosis not present

## 2018-05-24 DIAGNOSIS — M25561 Pain in right knee: Secondary | ICD-10-CM | POA: Diagnosis not present

## 2018-05-24 DIAGNOSIS — Z024 Encounter for examination for driving license: Secondary | ICD-10-CM | POA: Diagnosis not present

## 2018-05-24 DIAGNOSIS — Z23 Encounter for immunization: Secondary | ICD-10-CM | POA: Diagnosis not present

## 2018-05-27 DIAGNOSIS — M25561 Pain in right knee: Secondary | ICD-10-CM | POA: Diagnosis not present

## 2018-05-31 DIAGNOSIS — L814 Other melanin hyperpigmentation: Secondary | ICD-10-CM | POA: Diagnosis not present

## 2018-05-31 DIAGNOSIS — L821 Other seborrheic keratosis: Secondary | ICD-10-CM | POA: Diagnosis not present

## 2018-05-31 DIAGNOSIS — B360 Pityriasis versicolor: Secondary | ICD-10-CM | POA: Diagnosis not present

## 2018-05-31 DIAGNOSIS — D229 Melanocytic nevi, unspecified: Secondary | ICD-10-CM | POA: Diagnosis not present

## 2018-05-31 DIAGNOSIS — L819 Disorder of pigmentation, unspecified: Secondary | ICD-10-CM | POA: Diagnosis not present

## 2018-05-31 DIAGNOSIS — L918 Other hypertrophic disorders of the skin: Secondary | ICD-10-CM | POA: Diagnosis not present

## 2018-05-31 DIAGNOSIS — M25561 Pain in right knee: Secondary | ICD-10-CM | POA: Diagnosis not present

## 2018-05-31 DIAGNOSIS — D1801 Hemangioma of skin and subcutaneous tissue: Secondary | ICD-10-CM | POA: Diagnosis not present

## 2018-06-02 DIAGNOSIS — M25561 Pain in right knee: Secondary | ICD-10-CM | POA: Diagnosis not present

## 2018-06-22 DIAGNOSIS — M25561 Pain in right knee: Secondary | ICD-10-CM | POA: Diagnosis not present

## 2018-06-27 ENCOUNTER — Inpatient Hospital Stay: Payer: Medicare Other | Attending: Internal Medicine | Admitting: Internal Medicine

## 2018-06-27 ENCOUNTER — Other Ambulatory Visit: Payer: Self-pay

## 2018-06-27 ENCOUNTER — Inpatient Hospital Stay: Payer: Medicare Other

## 2018-06-27 ENCOUNTER — Encounter: Payer: Self-pay | Admitting: Internal Medicine

## 2018-06-27 VITALS — BP 162/95 | HR 60 | Temp 97.6°F | Resp 18 | Ht 74.0 in | Wt 263.0 lb

## 2018-06-27 DIAGNOSIS — E785 Hyperlipidemia, unspecified: Secondary | ICD-10-CM | POA: Insufficient documentation

## 2018-06-27 DIAGNOSIS — Z79899 Other long term (current) drug therapy: Secondary | ICD-10-CM | POA: Insufficient documentation

## 2018-06-27 DIAGNOSIS — I10 Essential (primary) hypertension: Secondary | ICD-10-CM | POA: Diagnosis not present

## 2018-06-27 DIAGNOSIS — C884 Extranodal marginal zone B-cell lymphoma of mucosa-associated lymphoid tissue [MALT-lymphoma]: Secondary | ICD-10-CM

## 2018-06-27 DIAGNOSIS — Z923 Personal history of irradiation: Secondary | ICD-10-CM | POA: Insufficient documentation

## 2018-06-27 DIAGNOSIS — Z87891 Personal history of nicotine dependence: Secondary | ICD-10-CM | POA: Diagnosis not present

## 2018-06-27 LAB — COMPREHENSIVE METABOLIC PANEL
ALT: 32 U/L (ref 0–44)
AST: 25 U/L (ref 15–41)
Albumin: 4.1 g/dL (ref 3.5–5.0)
Alkaline Phosphatase: 115 U/L (ref 38–126)
Anion gap: 7 (ref 5–15)
BUN: 16 mg/dL (ref 8–23)
CO2: 29 mmol/L (ref 22–32)
Calcium: 9.3 mg/dL (ref 8.9–10.3)
Chloride: 100 mmol/L (ref 98–111)
Creatinine, Ser: 0.73 mg/dL (ref 0.61–1.24)
GFR calc Af Amer: >60 mL/min (ref >60)
GFR calc non Af Amer: >60 mL/min (ref >60)
Glucose, Bld: 118 mg/dL — ABNORMAL HIGH (ref 70–99)
Potassium: 4.4 mmol/L (ref 3.5–5.1)
Sodium: 136 mmol/L (ref 135–145)
Total Bilirubin: 1.1 mg/dL (ref 0.3–1.2)
Total Protein: 7.1 g/dL (ref 6.5–8.1)

## 2018-06-27 LAB — CBC WITH DIFFERENTIAL/PLATELET
Basophils Absolute: 0 10*3/uL (ref 0–0.1)
Basophils Relative: 1 %
Eosinophils Absolute: 0.2 10*3/uL (ref 0–0.7)
Eosinophils Relative: 4 %
HCT: 45.1 % (ref 40.0–52.0)
Hemoglobin: 15.3 g/dL (ref 13.0–18.0)
Lymphocytes Relative: 28 %
Lymphs Abs: 1.5 10*3/uL (ref 1.0–3.6)
MCH: 28.6 pg (ref 26.0–34.0)
MCHC: 33.9 g/dL (ref 32.0–36.0)
MCV: 84.4 fL (ref 80.0–100.0)
Monocytes Absolute: 0.5 10*3/uL (ref 0.2–1.0)
Monocytes Relative: 9 %
Neutro Abs: 3 10*3/uL (ref 1.4–6.5)
Neutrophils Relative %: 58 %
Platelets: 257 10*3/uL (ref 150–440)
RBC: 5.34 MIL/uL (ref 4.40–5.90)
RDW: 12.9 % (ref 11.5–14.5)
WBC: 5.2 10*3/uL (ref 3.8–10.6)

## 2018-06-27 NOTE — Progress Notes (Signed)
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 [Dr.Lupton; [place]] RIGHT FOREARM s/p punch Biopsy; s/p RT [facility] 2017. 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  male with above history of  cutaneous marginal low-grade lymphoma right forearm at [facility] in April 2017; status post radiation in May 2017.  Patient was recently evaluated by dermatology.  No new lesions noted.  Denies any new lumps or bumps.  No weight loss no night sweats.   Review of Systems  Constitutional: Negative for chills, diaphoresis, fever, malaise/fatigue and weight loss.  HENT: Negative for nosebleeds and sore throat.   Eyes: Negative for double vision.  Respiratory: Negative for cough, hemoptysis, sputum production, shortness of breath and wheezing.   Cardiovascular: Negative for chest pain, palpitations, orthopnea and leg swelling.  Gastrointestinal: Negative for abdominal pain, blood in stool, constipation, diarrhea, heartburn, melena, nausea and vomiting.  Genitourinary: Negative for dysuria, frequency and urgency.  Musculoskeletal: Negative for back pain and joint pain.  Skin: Negative.  Negative for itching and rash.  Neurological: Negative for dizziness, tingling, focal weakness, weakness and  headaches.  Endo/Heme/Allergies: Does not bruise/bleed easily.  Psychiatric/Behavioral: Negative for depression. The patient is not nervous/anxious and does not have insomnia.      MEDICAL HISTORY:  Past Medical History:  Diagnosis Date  . Chronically dry eyes, bilateral   . GERD (gastroesophageal reflux disease)   . Hiatal hernia   . History of colon polyps 01/12/2013   hyperplastic  . History of external beam radiation therapy    face and right thigh , completed 2006 /  right forearm ,  4000 Gy,  completed 06/ 2017  . History of kidney stones   . History of radiation therapy    of lymphoma on skin: face and leg  . Hyperlipidemia   . Hypertension   . 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 Nephrolithiasis    bilateral   . OSA on CPAP   . Primary cutaneous lymphoma [facility]) oncologist-  dr Charlaine Dalton (cancer center [place])--- per lov note 10--04-2017 -- no recurrence clinically   dx 2006 -- face and right thigh cutaneous lymphoma s/p radiation therapy (done in [place]);  2012 right forearm knot s/p bx- may 2015 nodular lymphocytic infiltrate s/o immunocytoma;  04/ 2017  cutaneous marginal zone B Cell lymphoma , right forearm (punch bx [id number])  s/p  radiation therapy 06/ 2017  . SSBE (short-segment Barrett's esophagus)     SURGICAL HISTORY: Past Surgical History:  Procedure Laterality Date  . COLONOSCOPY WITH ESOPHAGOGASTRODUODENOSCOPY (EGD)  01/12/2014   in [place]  . CYSTOSCOPY/URETEROSCOPY/HOLMIUM LASER/STENT PLACEMENT Bilateral 08/04/2017   Procedure: CYSTOSCOPY/ RETROGRADE/URETEROSCOPY/HOLMIUM LASER/STENT PLACEMENT;  Surgeon: Ceasar Mons, MD;  Location: [facility];  Service: Urology;  Laterality: Bilateral;  .  EXTRACORPOREAL SHOCK WAVE LITHOTRIPSY  1990s  . HYDROCELE EXCISION Left 2000  approx.  . TONSILLECTOMY  child  . TOTAL KNEE ARTHROPLASTY Right 04/01/2018   Procedure: RIGHT TOTAL KNEE ARTHROPLASTY;  Surgeon: Netta Cedars, MD;   Location: [facility];  Service: Orthopedics;  Laterality: Right;    SOCIAL HISTORY: moved from [place].  Social History   Socioeconomic History  . Marital status: Married    Spouse name: Not on file  . Number of children: Not on file  . Years of education: Not on file  . Highest education level: Not on file  Occupational History  . Not on file  Social Needs  . Financial resource strain: Not on file  . Food insecurity:    Worry: Not on file    Inability: Not on file  . Transportation needs:    Medical: Not on file    Non-medical: Not on file  Tobacco Use  . Smoking status: Former Smoker    Packs/day: 2.00    Years: 22.00    Pack years: 44.00    Types: Cigarettes    Last attempt to quit: 08/22/1989    Years since quitting: 28.8  . Smokeless tobacco: Never Used  Substance and Sexual Activity  . Alcohol use: Yes    Alcohol/week: 8.0 standard drinks    Types: 7 Glasses of wine, 1 Shots of liquor per week    Comment: daily  wine and martini on the weekend.  . Drug use: No  . Sexual activity: Not on file  Lifestyle  . Physical activity:    Days per week: Not on file    Minutes per session: Not on file  . Stress: Not on file  Relationships  . Social connections:    Talks on phone: Not on file    Gets together: Not on file    Attends religious service: Not on file    Active member of club or organization: Not on file    Attends meetings of clubs or organizations: Not on file    Relationship status: Not on file  . Intimate partner violence:    Fear of current or ex partner: Not on file    Emotionally abused: Not on file    Physically abused: Not on file    Forced sexual activity: Not on file  Other Topics Concern  . Not on file  Social History Narrative  . Not on file    FAMILY HISTORY: Family History  Problem Relation Age of Onset  . Diabetes Father   . Heart murmur Mother   . Kidney disease Mother   . Aneurysm Mother   . Diabetes Brother     ALLERGIES:   has No Known Allergies.  MEDICATIONS:  Current Outpatient Medications  Medication Sig Dispense Refill  . Fish Oil-Cholecalciferol (OMEGA-3 + VITAMIN D3 PO) Take 4 capsules by mouth daily.    . Liniments (BLUE-EMU SUPER STRENGTH) CREA Apply 1 application topically daily as needed (pain).     Marland Kitchen losartan-hydrochlorothiazide (HYZAAR) 100-12.5 MG tablet Take 1 tablet by mouth daily.    . Multiple Vitamins-Minerals (PRESERVISION/LUTEIN PO) Take 2 capsules by mouth daily.    . naproxen sodium (ANAPROX) 220 MG tablet Take 220 mg by mouth daily as needed (pain).     Marland Kitchen omeprazole (PRILOSEC) 20 MG capsule Take 20 mg by mouth every evening.     Vladimir Faster Glycol-Propyl Glycol (SYSTANE OP) Apply 1 drop to eye 4 (four) times daily. Each eye     .  rosuvastatin (CRESTOR) 5 MG tablet Take 5 mg by mouth every morning.   2   No current facility-administered medications for this visit.       Marland Kitchen  PHYSICAL EXAMINATION: ECOG PERFORMANCE STATUS: 0 - Asymptomatic  Vitals:   06/27/18 1115  BP: (!) 162/95  Pulse: 60  Resp: 18  Temp: 97.6 F (36.4 C)   Filed Weights   06/27/18 1122  Weight: 263 lb (119.3 kg)    Physical Exam  Constitutional: He is oriented to person, place, and time and well-developed, well-nourished, and in no distress.  HENT:  Head: Normocephalic and atraumatic.  Mouth/Throat: Oropharynx is clear and moist. No oropharyngeal exudate.  Eyes: Pupils are equal, round, and reactive to light.  Neck: Normal range of motion. Neck supple.  Cardiovascular: Normal rate and regular rhythm.  Pulmonary/Chest: No respiratory distress. He has no wheezes.  Abdominal: Soft. Bowel sounds are normal. He exhibits no distension and no mass. There is no tenderness. There is no rebound and no guarding.  Musculoskeletal: Normal range of motion. He exhibits no edema or tenderness.  Neurological: He is alert and oriented to person, place, and time.  Skin: Skin is warm.  Psychiatric: Affect normal.      LABORATORY DATA:  I have reviewed the data as listed Lab Results  Component Value Date   WBC 5.2 06/27/2018   HGB 15.3 06/27/2018   HCT 45.1 06/27/2018   MCV 84.4 06/27/2018   PLT 257 06/27/2018   Recent Labs    06/28/17 1022  03/21/18 0906 04/02/18 0450 04/04/18 0500 06/27/18 1051  NA 138   < > 139 140 136 136  K 4.1   < > 3.9 4.1 3.7 4.4  CL 98*   < > 102 102 98 100  CO2 29  --  25 30 29 29   GLUCOSE 108*   < > 113* 144* 106* 118*  BUN 18   < > 13 15 13 16   CREATININE 0.84   < > 0.77 0.86 0.74 0.73  CALCIUM 9.4  --  9.2 9.1 8.5* 9.3  GFRNONAA >60  --  >60 >60 >60 >60  GFRAA >60  --  >60 >60 >60 >60  PROT 7.4  --  6.6  --   --  7.1  ALBUMIN 4.2  --  3.9  --   --  4.1  AST 29  --  27  --   --  25  ALT 31  --  35  --   --  32  ALKPHOS 79  --  88  --   --  115  BILITOT 1.2  --  1.1  --   --  1.1   < > = values in this interval not displayed.     ASSESSMENT & PLAN:   Primary cutaneous marginal zone B-cell lymphoma ([facility]) # Right forearm nodular lesion-Status post punch Biopsy- late April 2017 [[place]]- CUTANEOUS MARGINAL ZONE B CELL LYMPHOMA. S/p RT May 2017.    # Clinically no evidence of recurrence.  Stable.  # Right renal calculi-improved.    DISPOSITION:  # follow up in 1 year with labwork -cbc/cmp/ldh/MD- Dr.B  Cc; Dr.Hussian; [place]/[facility].      Cammie Sickle, MD 06/28/2018 7:29 AM

## 2018-06-27 NOTE — Assessment & Plan Note (Addendum)
#   Right forearm nodular lesion-Status post punch Biopsy- late April 2017 [[place]]- CUTANEOUS MARGINAL ZONE B CELL LYMPHOMA. S/p RT May 2017.    # Clinically no evidence of recurrence.  Stable.  # Right renal calculi-improved.    DISPOSITION:  # follow up in 1 year with labwork -cbc/cmp/ldh/MD- Dr.B  Cc; Dr.Hussian; [place]/[facility].

## 2018-08-30 DIAGNOSIS — I10 Essential (primary) hypertension: Secondary | ICD-10-CM | POA: Diagnosis not present

## 2018-08-30 DIAGNOSIS — E7800 Pure hypercholesterolemia, unspecified: Secondary | ICD-10-CM | POA: Diagnosis not present

## 2018-08-30 DIAGNOSIS — R7303 Prediabetes: Secondary | ICD-10-CM | POA: Diagnosis not present

## 2018-08-30 DIAGNOSIS — C8590 Non-Hodgkin lymphoma, unspecified, unspecified site: Secondary | ICD-10-CM | POA: Diagnosis not present

## 2018-08-30 DIAGNOSIS — K219 Gastro-esophageal reflux disease without esophagitis: Secondary | ICD-10-CM | POA: Diagnosis not present

## 2018-08-30 DIAGNOSIS — H04129 Dry eye syndrome of unspecified lacrimal gland: Secondary | ICD-10-CM | POA: Diagnosis not present

## 2018-09-14 IMAGING — DX DG ABDOMEN 1V
2 series · 2 of 2 positions shown · non-contrast
Comparison: 07/15/2017

CLINICAL DATA: Gas pain since yesterday.

EXAM:
ABDOMEN - 1 VIEW

[abdomen kub (1 of 2)]
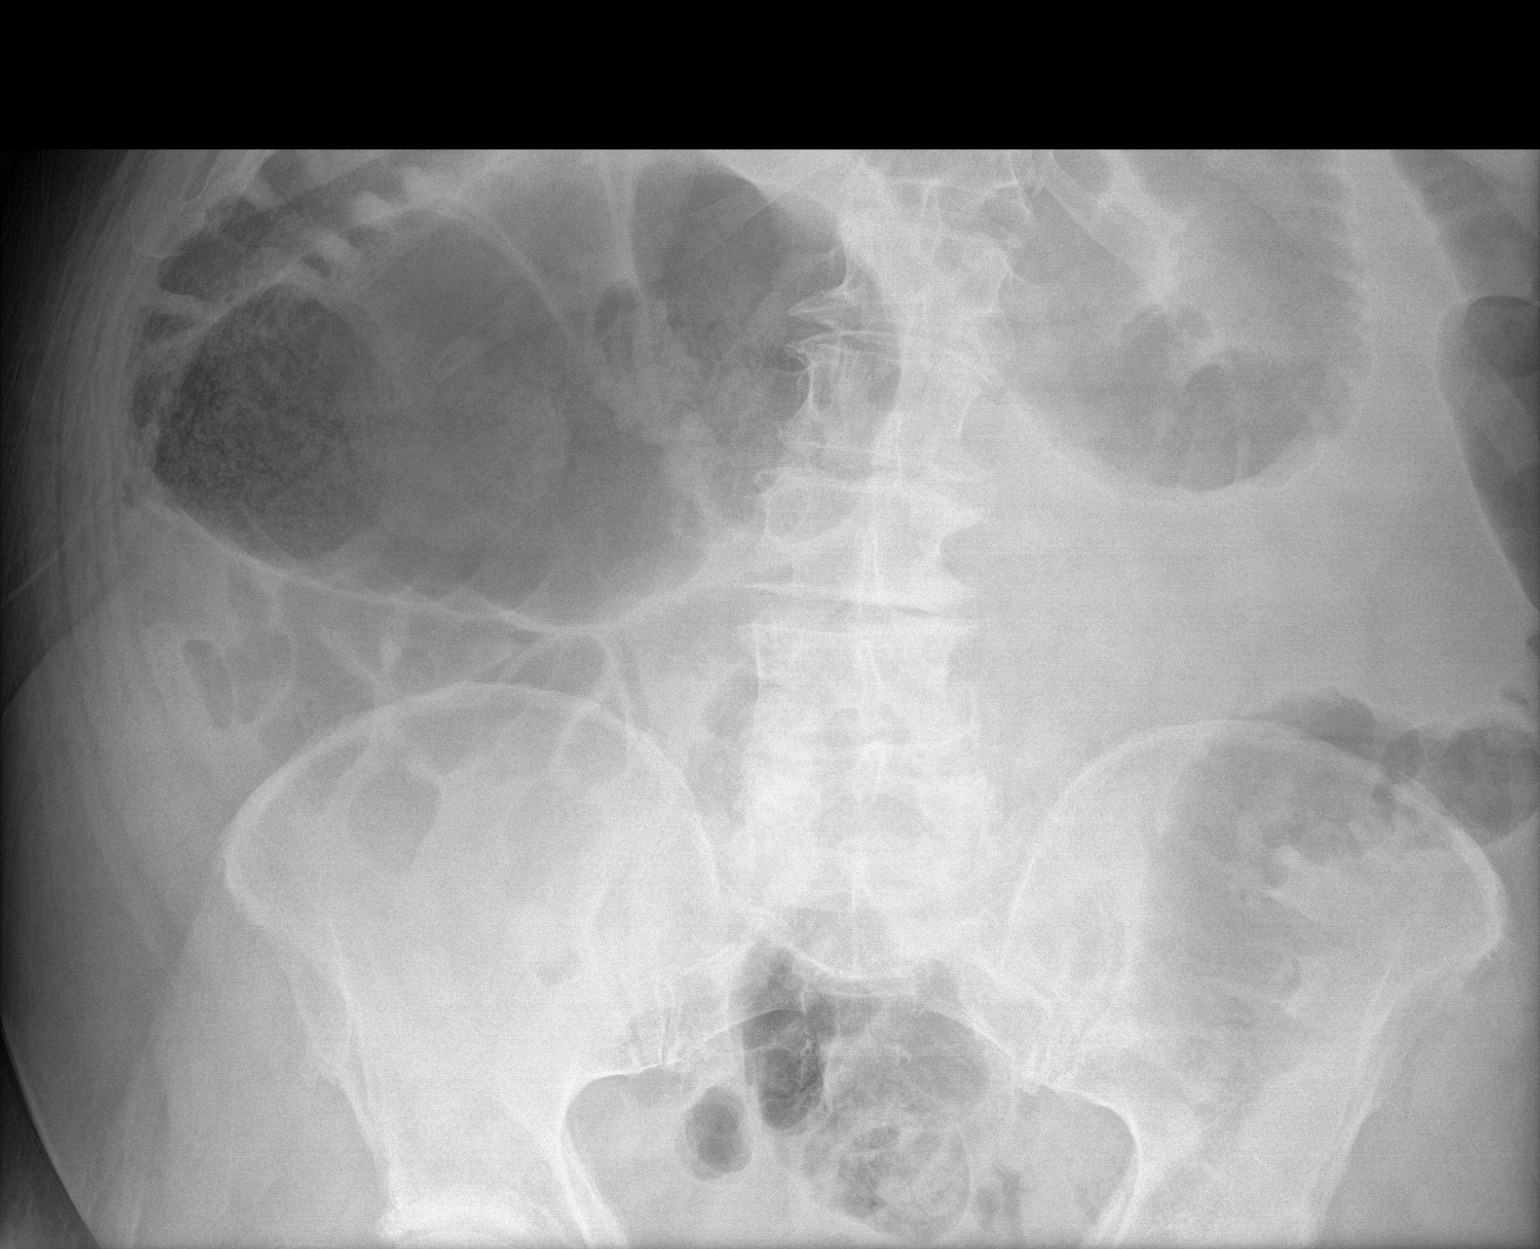

[abdomen kub (2 of 2)]
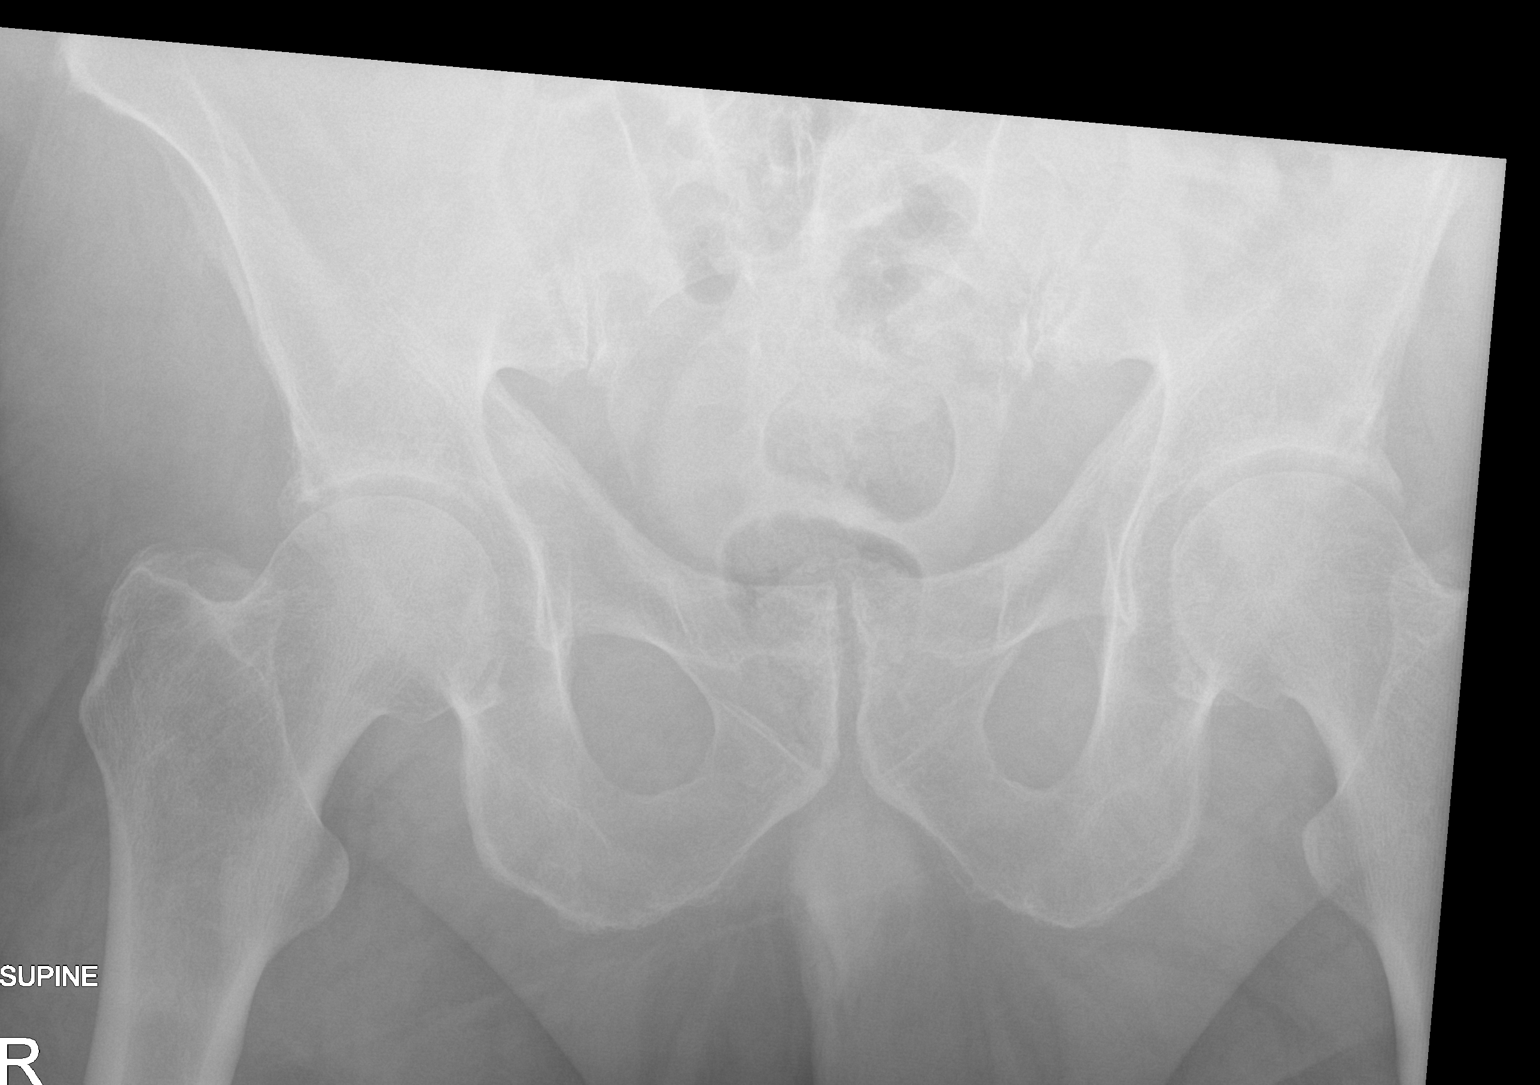

[2 of 2 positions shown; findings below may reference images not displayed]

FINDINGS: Patient does in fact have a large amount of gas within the colon.
The pattern suggests ileus. Distal colon obstruction not excluded
but not likely. Small bowel pattern is unremarkable.
IMPRESSION: Large amount of gas within the colon which could be symptomatic.
Probable colonic ileus.

## 2018-09-15 IMAGING — DX DG ABDOMEN 1V
2 series · 2 of 2 positions shown · non-contrast
Comparison: April 03, 2018

CLINICAL DATA: Abdominal pain

EXAM:
ABDOMEN - 1 VIEW

[abdomen kub (1 of 2)]
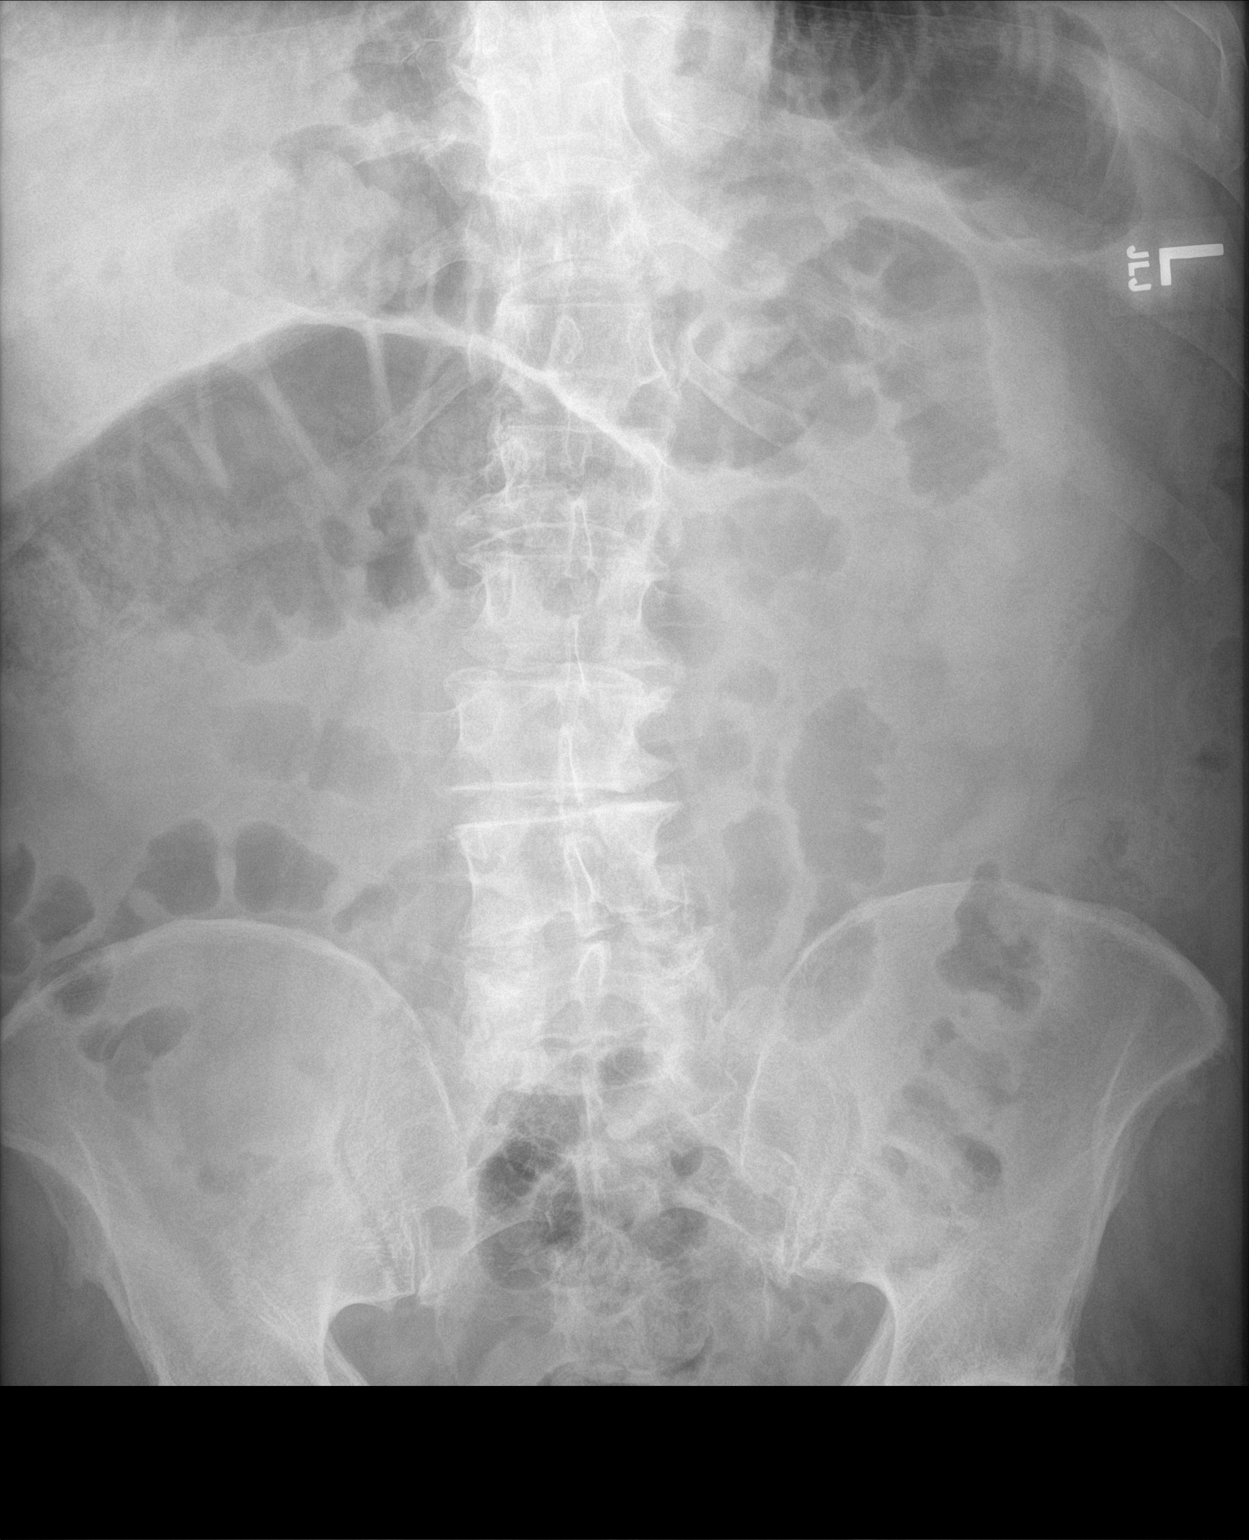

[abdomen kub (2 of 2)]
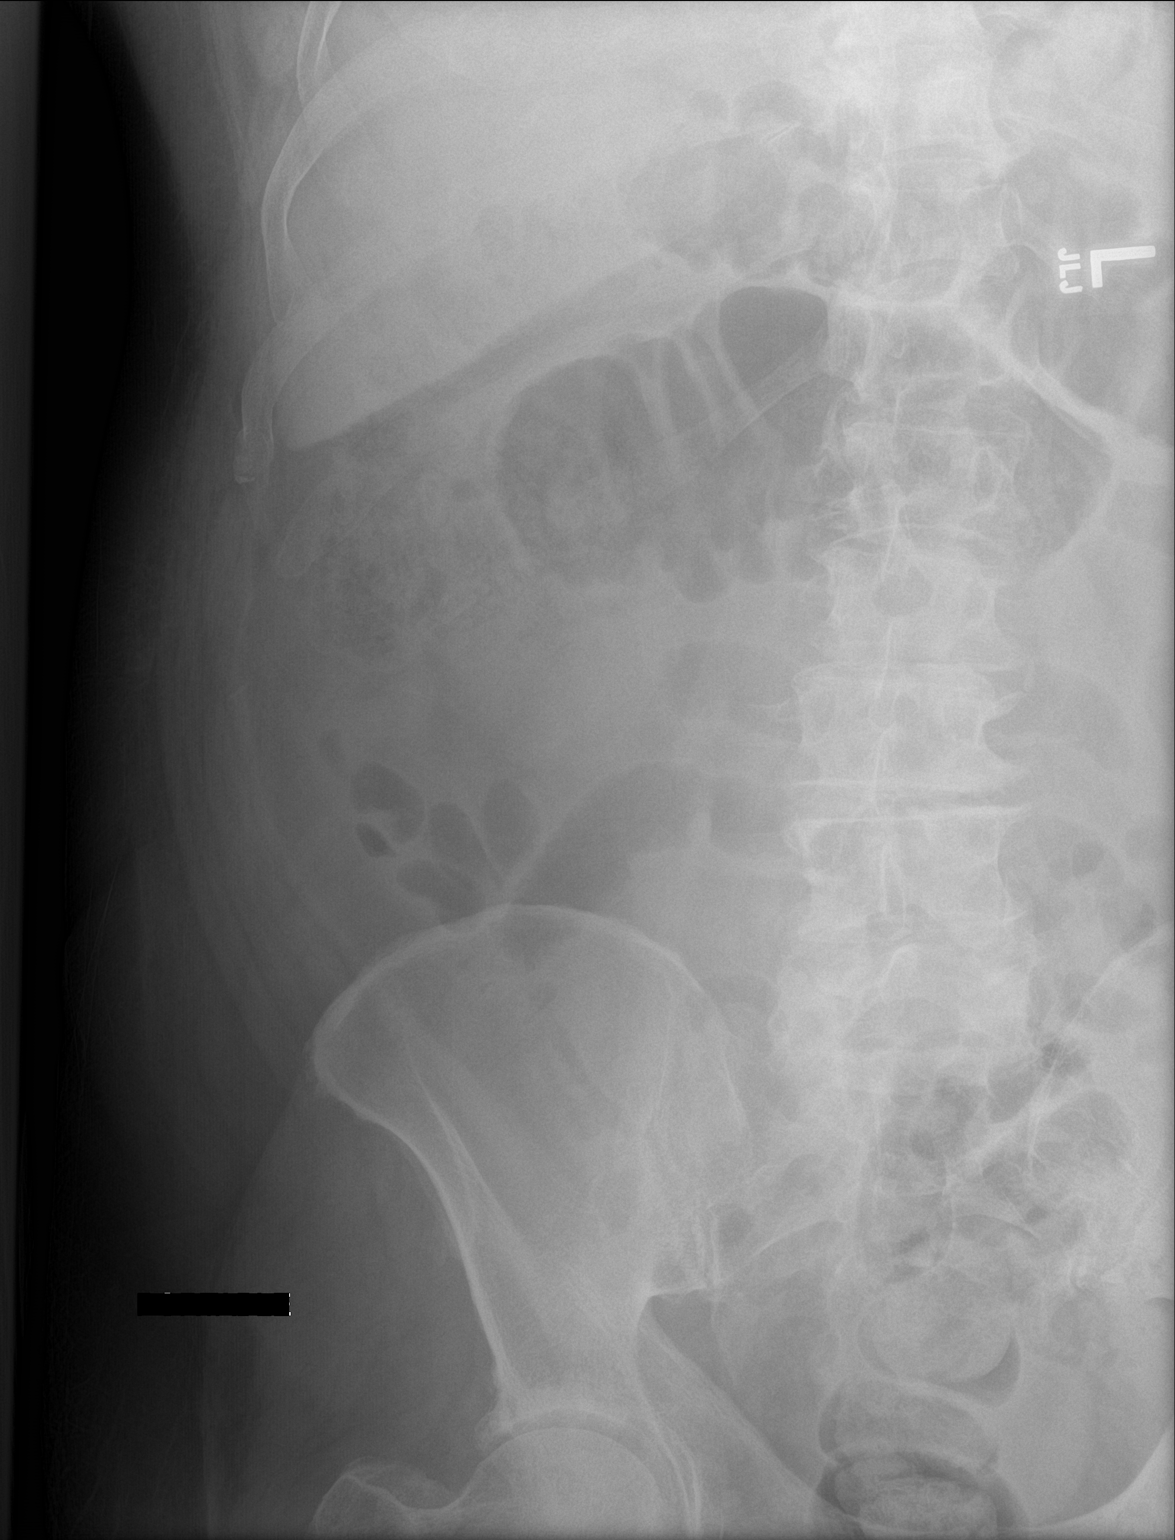

[2 of 2 positions shown; findings below may reference images not displayed]

FINDINGS: The dilated cecum is no longer evident. There are loops of
borderline small bowel dilatation and borderline transverse colon
dilatation on this current examination. There are no appreciable
air-fluid levels. There is moderate stool in the colon. No evident
free air or. There is air in the rectum.
IMPRESSION: Suspect a degree of ileus. Less dilatation compared to 1 day prior.
Frank obstruction is not felt to be likely. Moderate stool noted in
colon. No evident free air.

## 2018-09-29 DIAGNOSIS — Z96651 Presence of right artificial knee joint: Secondary | ICD-10-CM | POA: Diagnosis not present

## 2019-03-20 DIAGNOSIS — G4733 Obstructive sleep apnea (adult) (pediatric): Secondary | ICD-10-CM | POA: Diagnosis not present

## 2019-03-20 DIAGNOSIS — M179 Osteoarthritis of knee, unspecified: Secondary | ICD-10-CM | POA: Diagnosis not present

## 2019-03-20 DIAGNOSIS — C8590 Non-Hodgkin lymphoma, unspecified, unspecified site: Secondary | ICD-10-CM | POA: Diagnosis not present

## 2019-03-20 DIAGNOSIS — Z0000 Encounter for general adult medical examination without abnormal findings: Secondary | ICD-10-CM | POA: Diagnosis not present

## 2019-03-20 DIAGNOSIS — Z23 Encounter for immunization: Secondary | ICD-10-CM | POA: Diagnosis not present

## 2019-03-20 DIAGNOSIS — Z1389 Encounter for screening for other disorder: Secondary | ICD-10-CM | POA: Diagnosis not present

## 2019-03-20 DIAGNOSIS — N200 Calculus of kidney: Secondary | ICD-10-CM | POA: Diagnosis not present

## 2019-03-20 DIAGNOSIS — Z125 Encounter for screening for malignant neoplasm of prostate: Secondary | ICD-10-CM | POA: Diagnosis not present

## 2019-03-20 DIAGNOSIS — I10 Essential (primary) hypertension: Secondary | ICD-10-CM | POA: Diagnosis not present

## 2019-03-20 DIAGNOSIS — E7800 Pure hypercholesterolemia, unspecified: Secondary | ICD-10-CM | POA: Diagnosis not present

## 2019-03-20 DIAGNOSIS — H04129 Dry eye syndrome of unspecified lacrimal gland: Secondary | ICD-10-CM | POA: Diagnosis not present

## 2019-03-20 DIAGNOSIS — R7303 Prediabetes: Secondary | ICD-10-CM | POA: Diagnosis not present

## 2019-04-10 DIAGNOSIS — H353132 Nonexudative age-related macular degeneration, bilateral, intermediate dry stage: Secondary | ICD-10-CM | POA: Diagnosis not present

## 2019-04-10 DIAGNOSIS — H524 Presbyopia: Secondary | ICD-10-CM | POA: Diagnosis not present

## 2019-04-13 DIAGNOSIS — M25562 Pain in left knee: Secondary | ICD-10-CM | POA: Diagnosis not present

## 2019-04-13 DIAGNOSIS — Z471 Aftercare following joint replacement surgery: Secondary | ICD-10-CM | POA: Diagnosis not present

## 2019-04-13 DIAGNOSIS — Z96651 Presence of right artificial knee joint: Secondary | ICD-10-CM | POA: Diagnosis not present

## 2019-04-13 DIAGNOSIS — M170 Bilateral primary osteoarthritis of knee: Secondary | ICD-10-CM | POA: Diagnosis not present

## 2019-06-27 ENCOUNTER — Other Ambulatory Visit: Payer: Self-pay

## 2019-06-27 DIAGNOSIS — C884 Extranodal marginal zone B-cell lymphoma of mucosa-associated lymphoid tissue [MALT-lymphoma]: Secondary | ICD-10-CM

## 2019-06-28 ENCOUNTER — Inpatient Hospital Stay: Payer: Medicare Other

## 2019-06-28 ENCOUNTER — Inpatient Hospital Stay: Payer: Medicare Other | Admitting: Internal Medicine

## 2019-06-28 NOTE — Assessment & Plan Note (Deleted)
#   Right forearm nodular lesion-Status post punch Biopsy- late April 2017 [[place]]- CUTANEOUS MARGINAL ZONE B CELL LYMPHOMA. S/p RT May 2017.    # Clinically no evidence of recurrence.  Stable.  # Right renal calculi-improved.    DISPOSITION:  # follow up in 1 year with labwork -cbc/cmp/ldh/MD- Dr.B  Cc; Dr.Hussian; [place]/[facility].

## 2019-06-28 NOTE — Progress Notes (Deleted)
[facility] [place] NOTE  Patient Care Team: Wenda Low, MD as PCP - General (Internal Medicine) Druscilla Brownie, MD as Consulting Physician (Dermatology)  CHIEF COMPLAINTS/PURPOSE OF CONSULTATION:   Oncology History Overview Note  # CUTANEOUS LYMPHOMA- [face & Right thigh s/p RT 2006; Dr.Gold; [place]]; 2012-Right Fore Arm knot [s/p Bx- May 2015 nodular dense lymphocytic infiltrate s/o Immunocytoma]  # April 2017 CUTANEOUS MARGINAL ZONE B CELL LYMPHOMA. [Dr.Lupton; [place]] RIGHT FOREARM s/p punch Biopsy; s/p RT [facility] 2017. 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  male with above history of  cutaneous marginal low-grade lymphoma right forearm at [facility] in April 2017; status post radiation in May 2017.  Patient was recently evaluated by dermatology.  No new lesions noted.  Denies any new lumps or bumps.  No weight loss no night sweats.   Review of Systems  Constitutional: Negative for chills, diaphoresis, fever, malaise/fatigue and weight loss.  HENT: Negative for nosebleeds and sore throat.   Eyes: Negative for double vision.  Respiratory: Negative for cough, hemoptysis, sputum production, shortness of breath and wheezing.   Cardiovascular: Negative for chest pain, palpitations, orthopnea and leg swelling.  Gastrointestinal: Negative for abdominal pain, blood in stool, constipation, diarrhea, heartburn, melena, nausea and vomiting.  Genitourinary: Negative for dysuria, frequency and urgency.  Musculoskeletal: Negative for back pain and joint pain.  Skin: Negative.  Negative for itching and rash.  Neurological: Negative for dizziness, tingling, focal weakness, weakness  and headaches.  Endo/Heme/Allergies: Does not bruise/bleed easily.  Psychiatric/Behavioral: Negative for depression. The patient is not nervous/anxious and does not have insomnia.      MEDICAL HISTORY:  Past Medical History:  Diagnosis Date  . Chronically dry eyes, bilateral   . GERD (gastroesophageal reflux disease)   . Hiatal hernia   . History of colon polyps 01/12/2013   hyperplastic  . History of external beam radiation therapy    face and right thigh , completed 2006 /  right forearm ,  4000 Gy,  completed 06/ 2017  . History of kidney stones   . History of radiation therapy    of lymphoma on skin: face and leg  . Hyperlipidemia   . Hypertension   . 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 Nephrolithiasis    bilateral   . OSA on CPAP   . Primary cutaneous lymphoma [facility]) oncologist-  dr Charlaine Dalton (cancer center [place])--- per lov note 10--04-2017 -- no recurrence clinically   dx 2006 -- face and right thigh cutaneous lymphoma s/p radiation therapy (done in [place]);  2012 right forearm knot s/p bx- may 2015 nodular lymphocytic infiltrate s/o immunocytoma;  04/ 2017  cutaneous marginal zone B Cell lymphoma , right forearm (punch bx [id number])  s/p  radiation therapy 06/ 2017  . SSBE (short-segment Barrett's esophagus)     SURGICAL HISTORY: Past Surgical History:  Procedure Laterality Date  . COLONOSCOPY WITH ESOPHAGOGASTRODUODENOSCOPY (EGD)  01/12/2014   in [place]  . CYSTOSCOPY/URETEROSCOPY/HOLMIUM LASER/STENT PLACEMENT Bilateral 08/04/2017   Procedure: CYSTOSCOPY/ RETROGRADE/URETEROSCOPY/HOLMIUM LASER/STENT PLACEMENT;  Surgeon: Ceasar Mons, MD;  Location: [facility];  Service: Urology;  Laterality: Bilateral;  . EXTRACORPOREAL SHOCK  WAVE LITHOTRIPSY  1990s  . HYDROCELE EXCISION Left 2000  approx.  . TONSILLECTOMY  child  . TOTAL KNEE ARTHROPLASTY Right 04/01/2018   Procedure: RIGHT TOTAL KNEE ARTHROPLASTY;  Surgeon: Netta Cedars,  MD;  Location: [facility];  Service: Orthopedics;  Laterality: Right;    SOCIAL HISTORY: moved from [place].  Social History   Socioeconomic History  . Marital status: Married    Spouse name: Not on file  . Number of children: Not on file  . Years of education: Not on file  . Highest education level: Not on file  Occupational History  . Not on file  Social Needs  . Financial resource strain: Not on file  . Food insecurity    Worry: Not on file    Inability: Not on file  . Transportation needs    Medical: Not on file    Non-medical: Not on file  Tobacco Use  . Smoking status: Former Smoker    Packs/day: 2.00    Years: 22.00    Pack years: 44.00    Types: Cigarettes    Quit date: 08/22/1989    Years since quitting: 29.8  . Smokeless tobacco: Never Used  Substance and Sexual Activity  . Alcohol use: Yes    Alcohol/week: 8.0 standard drinks    Types: 7 Glasses of wine, 1 Shots of liquor per week    Comment: daily  wine and martini on the weekend.  . Drug use: No  . Sexual activity: Not on file  Lifestyle  . Physical activity    Days per week: Not on file    Minutes per session: Not on file  . Stress: Not on file  Relationships  . Social Herbalist on phone: Not on file    Gets together: Not on file    Attends religious service: Not on file    Active member of club or organization: Not on file    Attends meetings of clubs or organizations: Not on file    Relationship status: Not on file  . Intimate partner violence    Fear of current or ex partner: Not on file    Emotionally abused: Not on file    Physically abused: Not on file    Forced sexual activity: Not on file  Other Topics Concern  . Not on file  Social History Narrative  . Not on file    FAMILY HISTORY: Family History  Problem Relation Age of Onset  . Diabetes Father   . Heart murmur Mother   . Kidney disease Mother   . Aneurysm Mother   . Diabetes Brother     ALLERGIES:  has No Known  Allergies.  MEDICATIONS:  Current Outpatient Medications  Medication Sig Dispense Refill  . Fish Oil-Cholecalciferol (OMEGA-3 + VITAMIN D3 PO) Take 4 capsules by mouth daily.    . Liniments (BLUE-EMU SUPER STRENGTH) CREA Apply 1 application topically daily as needed (pain).     Marland Kitchen losartan-hydrochlorothiazide (HYZAAR) 100-12.5 MG tablet Take 1 tablet by mouth daily.    . Multiple Vitamins-Minerals (PRESERVISION/LUTEIN PO) Take 2 capsules by mouth daily.    . naproxen sodium (ANAPROX) 220 MG tablet Take 220 mg by mouth daily as needed (pain).     Marland Kitchen omeprazole (PRILOSEC) 20 MG capsule Take 20 mg by mouth every evening.     Vladimir Faster Glycol-Propyl Glycol (SYSTANE OP) Apply 1 drop to eye 4 (four) times daily. Each eye     . rosuvastatin (CRESTOR)  5 MG tablet Take 5 mg by mouth every morning.   2   No current facility-administered medications for this visit.       Marland Kitchen  PHYSICAL EXAMINATION: ECOG PERFORMANCE STATUS: 0 - Asymptomatic  There were no vitals filed for this visit. There were no vitals filed for this visit.  Physical Exam  Constitutional: He is oriented to person, place, and time and well-developed, well-nourished, and in no distress.  HENT:  Head: Normocephalic and atraumatic.  Mouth/Throat: Oropharynx is clear and moist. No oropharyngeal exudate.  Eyes: Pupils are equal, round, and reactive to light.  Neck: Normal range of motion. Neck supple.  Cardiovascular: Normal rate and regular rhythm.  Pulmonary/Chest: No respiratory distress. He has no wheezes.  Abdominal: Soft. Bowel sounds are normal. He exhibits no distension and no mass. There is no abdominal tenderness. There is no rebound and no guarding.  Musculoskeletal: Normal range of motion.        General: No tenderness or edema.  Neurological: He is alert and oriented to person, place, and time.  Skin: Skin is warm.  Psychiatric: Affect normal.     LABORATORY DATA:  I have reviewed the data as listed Lab  Results  Component Value Date   WBC 5.2 06/27/2018   HGB 15.3 06/27/2018   HCT 45.1 06/27/2018   MCV 84.4 06/27/2018   PLT 257 06/27/2018   No results for input(s): NA, K, CL, CO2, GLUCOSE, BUN, CREATININE, CALCIUM, GFRNONAA, GFRAA, PROT, ALBUMIN, AST, ALT, ALKPHOS, BILITOT, BILIDIR, IBILI in the last 8760 hours.   ASSESSMENT & PLAN:   No problem-specific Assessment & Plan notes found for this encounter.     Cammie Sickle, MD 06/28/2019 8:32 AM

## 2019-07-05 ENCOUNTER — Telehealth: Payer: Self-pay

## 2019-07-05 NOTE — Telephone Encounter (Signed)
Pre-visit assessment call attempted prior to [facility] appointment with Dr. Rogue Bussing on 07/06/2019. No answer . No alternate contact # / msg left - no need for callback - last visit was 1 week ago.

## 2019-07-06 ENCOUNTER — Encounter: Payer: Self-pay | Admitting: Internal Medicine

## 2019-07-06 ENCOUNTER — Inpatient Hospital Stay (HOSPITAL_BASED_OUTPATIENT_CLINIC_OR_DEPARTMENT_OTHER): Payer: Medicare Other | Admitting: Internal Medicine

## 2019-07-06 ENCOUNTER — Other Ambulatory Visit: Payer: Self-pay

## 2019-07-06 ENCOUNTER — Inpatient Hospital Stay: Payer: Medicare Other | Attending: Internal Medicine

## 2019-07-06 DIAGNOSIS — Z79899 Other long term (current) drug therapy: Secondary | ICD-10-CM | POA: Diagnosis not present

## 2019-07-06 DIAGNOSIS — Z923 Personal history of irradiation: Secondary | ICD-10-CM | POA: Diagnosis not present

## 2019-07-06 DIAGNOSIS — F17210 Nicotine dependence, cigarettes, uncomplicated: Secondary | ICD-10-CM | POA: Insufficient documentation

## 2019-07-06 DIAGNOSIS — Z8572 Personal history of non-Hodgkin lymphomas: Secondary | ICD-10-CM | POA: Diagnosis not present

## 2019-07-06 DIAGNOSIS — I10 Essential (primary) hypertension: Secondary | ICD-10-CM | POA: Diagnosis not present

## 2019-07-06 DIAGNOSIS — C884 Extranodal marginal zone B-cell lymphoma of mucosa-associated lymphoid tissue [MALT-lymphoma]: Secondary | ICD-10-CM

## 2019-07-06 DIAGNOSIS — K219 Gastro-esophageal reflux disease without esophagitis: Secondary | ICD-10-CM | POA: Insufficient documentation

## 2019-07-06 DIAGNOSIS — E785 Hyperlipidemia, unspecified: Secondary | ICD-10-CM | POA: Insufficient documentation

## 2019-07-06 LAB — CBC WITH DIFFERENTIAL/PLATELET
Abs Immature Granulocytes: 0.01 10*3/uL (ref 0.00–0.07)
Basophils Absolute: 0 10*3/uL (ref 0.0–0.1)
Basophils Relative: 1 %
Eosinophils Absolute: 0.2 10*3/uL (ref 0.0–0.5)
Eosinophils Relative: 3 %
HCT: 45.9 % (ref 39.0–52.0)
Hemoglobin: 15.1 g/dL (ref 13.0–17.0)
Immature Granulocytes: 0 %
Lymphocytes Relative: 24 %
Lymphs Abs: 1.2 10*3/uL (ref 0.7–4.0)
MCH: 27.9 pg (ref 26.0–34.0)
MCHC: 32.9 g/dL (ref 30.0–36.0)
MCV: 84.8 fL (ref 80.0–100.0)
Monocytes Absolute: 0.6 10*3/uL (ref 0.1–1.0)
Monocytes Relative: 12 %
Neutro Abs: 3.2 10*3/uL (ref 1.7–7.7)
Neutrophils Relative %: 60 %
Platelets: 227 10*3/uL (ref 150–400)
RBC: 5.41 MIL/uL (ref 4.22–5.81)
RDW: 12.8 % (ref 11.5–15.5)
WBC: 5.2 10*3/uL (ref 4.0–10.5)
nRBC: 0 % (ref 0.0–0.2)

## 2019-07-06 LAB — COMPREHENSIVE METABOLIC PANEL
ALT: 27 U/L (ref 0–44)
AST: 21 U/L (ref 15–41)
Albumin: 4.4 g/dL (ref 3.5–5.0)
Alkaline Phosphatase: 97 U/L (ref 38–126)
Anion gap: 6 (ref 5–15)
BUN: 16 mg/dL (ref 8–23)
CO2: 29 mmol/L (ref 22–32)
Calcium: 9.4 mg/dL (ref 8.9–10.3)
Chloride: 104 mmol/L (ref 98–111)
Creatinine, Ser: 0.72 mg/dL (ref 0.61–1.24)
GFR calc Af Amer: >60 mL/min (ref >60)
GFR calc non Af Amer: >60 mL/min (ref >60)
Glucose, Bld: 103 mg/dL — ABNORMAL HIGH (ref 70–99)
Potassium: 4.7 mmol/L (ref 3.5–5.1)
Sodium: 139 mmol/L (ref 135–145)
Total Bilirubin: 1.3 mg/dL — ABNORMAL HIGH (ref 0.3–1.2)
Total Protein: 7 g/dL (ref 6.5–8.1)

## 2019-07-06 LAB — LACTATE DEHYDROGENASE: LDH: 122 U/L (ref 98–192)

## 2019-07-06 NOTE — Assessment & Plan Note (Addendum)
#   Right forearm nodular lesion-Status post punch Biopsy- late April 2017 [[place]]- CUTANEOUS MARGINAL ZONE B CELL LYMPHOMA. S/p RT May 2017.    # Clinically no evidence of recurrence.  Stable.  # intentional sytem- 35 weighht loss/Nutrisystem.  No concerns for any malignant process.   DISPOSITION:  # follow up in 1 year- MD -cbc/cmp/ldh/MD- Dr.B  Cc; Dr.Hussian; [place].

## 2019-07-06 NOTE — Progress Notes (Signed)
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 [Dr.Lupton; [place]] RIGHT FOREARM s/p punch Biopsy; s/p RT [facility] 2017. 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  male with above history of  cutaneous marginal low-grade lymphoma right forearm at [facility] in April 2017; status post radiation in May 2017.  Patient continues to be followed closely by dermatology.  No lesions noted.  No new lumps or bumps.  Patient had intentional weight loss of about 35 pounds.  Otherwise no night sweats.    Review of Systems  Constitutional: Negative for chills, diaphoresis, fever, malaise/fatigue and weight loss.  HENT: Negative for nosebleeds and sore throat.   Eyes: Negative for double vision.  Respiratory: Negative for cough, hemoptysis, sputum production, shortness of breath and wheezing.   Cardiovascular: Negative for chest pain, palpitations, orthopnea and leg swelling.  Gastrointestinal: Negative for abdominal pain, blood in stool, constipation, diarrhea, heartburn, melena, nausea and vomiting.  Genitourinary: Negative for dysuria, frequency and urgency.  Musculoskeletal: Negative for back pain and joint pain.  Skin: Negative.  Negative for itching and rash.  Neurological: Negative  for dizziness, tingling, focal weakness, weakness and headaches.  Endo/Heme/Allergies: Does not bruise/bleed easily.  Psychiatric/Behavioral: Negative for depression. The patient is not nervous/anxious and does not have insomnia.      MEDICAL HISTORY:  Past Medical History:  Diagnosis Date  . Chronically dry eyes, bilateral   . GERD (gastroesophageal reflux disease)   . Hiatal hernia   . History of colon polyps 01/12/2013   hyperplastic  . History of external beam radiation therapy    face and right thigh , completed 2006 /  right forearm ,  4000 Gy,  completed 06/ 2017  . History of kidney stones   . History of radiation therapy    of lymphoma on skin: face and leg  . Hyperlipidemia   . Hypertension   . 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 Nephrolithiasis    bilateral   . OSA on CPAP   . Primary cutaneous lymphoma [facility]) oncologist-  dr Charlaine Dalton (cancer center [place])--- per lov note 10--04-2017 -- no recurrence clinically   dx 2006 -- face and right thigh cutaneous lymphoma s/p radiation therapy (done in [place]);  2012 right forearm knot s/p bx- may 2015 nodular lymphocytic infiltrate s/o immunocytoma;  04/ 2017  cutaneous marginal zone B Cell lymphoma , right forearm (punch bx [id number])  s/p  radiation therapy 06/ 2017  . SSBE (short-segment Barrett's esophagus)     SURGICAL HISTORY: Past Surgical History:  Procedure Laterality Date  . COLONOSCOPY WITH ESOPHAGOGASTRODUODENOSCOPY (EGD)  01/12/2014   in [place]  . CYSTOSCOPY/URETEROSCOPY/HOLMIUM LASER/STENT PLACEMENT Bilateral 08/04/2017   Procedure: CYSTOSCOPY/ RETROGRADE/URETEROSCOPY/HOLMIUM LASER/STENT PLACEMENT;  Surgeon: Ceasar Mons, MD;  Location: [facility];  Service: Urology;  Laterality: Bilateral;  . EXTRACORPOREAL SHOCK WAVE LITHOTRIPSY  1990s  . HYDROCELE EXCISION Left 2000  approx.  . TONSILLECTOMY  child  . TOTAL KNEE ARTHROPLASTY Right 04/01/2018   Procedure: RIGHT  TOTAL KNEE ARTHROPLASTY;  Surgeon: Netta Cedars, MD;  Location: [facility];  Service: Orthopedics;  Laterality: Right;    SOCIAL HISTORY: moved from [place].  Social History   Socioeconomic History  . Marital status: Married    Spouse name: Not on file  . Number of children: Not on file  . Years of education: Not on file  . Highest education level: Not on file  Occupational History  . Not on file  Social Needs  . Financial resource strain: Not on file  . Food insecurity    Worry: Not on file    Inability: Not on file  . Transportation needs    Medical: Not on file    Non-medical: Not on file  Tobacco Use  . Smoking status: Former Smoker    Packs/day: 2.00    Years: 22.00    Pack years: 44.00    Types: Cigarettes    Quit date: 08/22/1989    Years since quitting: 29.8  . Smokeless tobacco: Never Used  Substance and Sexual Activity  . Alcohol use: Yes    Alcohol/week: 8.0 standard drinks    Types: 7 Glasses of wine, 1 Shots of liquor per week    Comment: daily  wine and martini on the weekend.  . Drug use: No  . Sexual activity: Not on file  Lifestyle  . Physical activity    Days per week: Not on file    Minutes per session: Not on file  . Stress: Not on file  Relationships  . Social Herbalist on phone: Not on file    Gets together: Not on file    Attends religious service: Not on file    Active member of club or organization: Not on file    Attends meetings of clubs or organizations: Not on file    Relationship status: Not on file  . Intimate partner violence    Fear of current or ex partner: Not on file    Emotionally abused: Not on file    Physically abused: Not on file    Forced sexual activity: Not on file  Other Topics Concern  . Not on file  Social History Narrative  . Not on file    FAMILY HISTORY: Family History  Problem Relation Age of Onset  . Diabetes Father   . Heart murmur Mother   . Kidney disease Mother   . Aneurysm Mother   .  Diabetes Brother     ALLERGIES:  has No Known Allergies.  MEDICATIONS:  Current Outpatient Medications  Medication Sig Dispense Refill  . Fish Oil-Cholecalciferol (OMEGA-3 + VITAMIN D3 PO) Take 4 capsules by mouth daily.    . Liniments (BLUE-EMU SUPER STRENGTH) CREA Apply 1 application topically daily as needed (pain).     Marland Kitchen losartan-hydrochlorothiazide (HYZAAR) 100-12.5 MG tablet Take 1 tablet by mouth daily.    . naproxen sodium (ANAPROX) 220 MG tablet Take 220 mg by mouth daily as needed (pain).     Marland Kitchen omeprazole (PRILOSEC) 20 MG capsule Take 20 mg by mouth every evening.     Vladimir Faster Glycol-Propyl Glycol (SYSTANE OP) Apply 1 drop to eye 4 (four) times daily. Each eye     . rosuvastatin (CRESTOR) 5 MG tablet Take 5  mg by mouth every morning.   2   No current facility-administered medications for this visit.       Marland Kitchen  PHYSICAL EXAMINATION: ECOG PERFORMANCE STATUS: 0 - Asymptomatic  Vitals:   07/06/19 1052  BP: (!) 154/90  Pulse: (!) 55  Resp: 18  Temp: 97.8 F (36.6 C)  SpO2: 98%   Filed Weights   07/06/19 1052  Weight: 238 lb 6.4 oz (108.1 kg)    Physical Exam  Constitutional: He is oriented to person, place, and time and well-developed, well-nourished, and in no distress.  HENT:  Head: Normocephalic and atraumatic.  Mouth/Throat: Oropharynx is clear and moist. No oropharyngeal exudate.  Eyes: Pupils are equal, round, and reactive to light.  Neck: Normal range of motion. Neck supple.  Cardiovascular: Normal rate and regular rhythm.  Pulmonary/Chest: No respiratory distress. He has no wheezes.  Abdominal: Soft. Bowel sounds are normal. He exhibits no distension and no mass. There is no abdominal tenderness. There is no rebound and no guarding.  Musculoskeletal: Normal range of motion.        General: No tenderness or edema.  Neurological: He is alert and oriented to person, place, and time.  Skin: Skin is warm.  Psychiatric: Affect normal.     LABORATORY  DATA:  I have reviewed the data as listed Lab Results  Component Value Date   WBC 5.2 07/06/2019   HGB 15.1 07/06/2019   HCT 45.9 07/06/2019   MCV 84.8 07/06/2019   PLT 227 07/06/2019   Recent Labs    07/06/19 1012  NA 139  K 4.7  CL 104  CO2 29  GLUCOSE 103*  BUN 16  CREATININE 0.72  CALCIUM 9.4  GFRNONAA >60  GFRAA >60  PROT 7.0  ALBUMIN 4.4  AST 21  ALT 27  ALKPHOS 97  BILITOT 1.3*     ASSESSMENT & PLAN:   Primary cutaneous marginal zone B-cell lymphoma (HCC) # Right forearm nodular lesion-Status post punch Biopsy- late April 2017 [[place]]- CUTANEOUS MARGINAL ZONE B CELL LYMPHOMA. S/p RT May 2017.    # Clinically no evidence of recurrence.  Stable.  # intentional sytem- 35 weighht loss/[employer].  No concerns for any malignant process.   DISPOSITION:  # follow up in 1 year- MD -cbc/cmp/ldh/MD- Dr.B  Cc; Dr.Hussian; [place]/[facility].      Cammie Sickle, MD 07/07/2019 7:00 AM

## 2019-07-06 NOTE — Progress Notes (Signed)
Pt in for follow up, denies any concerns today. 

## 2019-10-05 DIAGNOSIS — M5136 Other intervertebral disc degeneration, lumbar region: Secondary | ICD-10-CM | POA: Diagnosis not present

## 2019-10-19 DIAGNOSIS — M25562 Pain in left knee: Secondary | ICD-10-CM | POA: Diagnosis not present

## 2019-10-19 DIAGNOSIS — M545 Low back pain: Secondary | ICD-10-CM | POA: Diagnosis not present

## 2019-10-26 DIAGNOSIS — M25562 Pain in left knee: Secondary | ICD-10-CM | POA: Diagnosis not present

## 2019-10-31 DIAGNOSIS — M5136 Other intervertebral disc degeneration, lumbar region: Secondary | ICD-10-CM | POA: Diagnosis not present

## 2019-11-07 DIAGNOSIS — M25562 Pain in left knee: Secondary | ICD-10-CM | POA: Diagnosis not present

## 2019-11-07 DIAGNOSIS — M5136 Other intervertebral disc degeneration, lumbar region: Secondary | ICD-10-CM | POA: Diagnosis not present

## 2019-11-13 DIAGNOSIS — L723 Sebaceous cyst: Secondary | ICD-10-CM | POA: Diagnosis not present

## 2019-11-13 DIAGNOSIS — D485 Neoplasm of uncertain behavior of skin: Secondary | ICD-10-CM | POA: Diagnosis not present

## 2019-11-21 DIAGNOSIS — M5136 Other intervertebral disc degeneration, lumbar region: Secondary | ICD-10-CM | POA: Diagnosis not present

## 2019-11-23 ENCOUNTER — Ambulatory Visit: Payer: Medicare Other | Attending: Internal Medicine

## 2019-11-23 DIAGNOSIS — Z23 Encounter for immunization: Secondary | ICD-10-CM | POA: Insufficient documentation

## 2019-11-23 NOTE — Progress Notes (Signed)
   Covid-19 Vaccination Clinic  Name:  Michael Huffman    MRN: [id number] DOB: 09-Dec-1950  11/23/2019  Michael Huffman was observed post Covid-19 immunization for 15 minutes without incident. He was provided with Vaccine Information Sheet and instruction to access the V-Safe system.   Michael Huffman was instructed to call 911 with any severe reactions post vaccine: Marland Kitchen Difficulty breathing  . Swelling of face and throat  . A fast heartbeat  . A bad rash all over body  . Dizziness and weakness   Immunizations Administered    Name Date Dose VIS Date Route   Pfizer COVID-19 Vaccine 11/23/2019  9:07 AM 0.3 mL 09/01/2019 Intramuscular   Manufacturer: [place]   Lot: [id number]   [place]: [id number]

## 2019-12-19 ENCOUNTER — Ambulatory Visit: Payer: Medicare Other | Attending: Internal Medicine

## 2019-12-19 DIAGNOSIS — Z23 Encounter for immunization: Secondary | ICD-10-CM

## 2019-12-19 NOTE — Progress Notes (Signed)
   Covid-19 Vaccination Clinic  Name:  Michael Huffman    MRN: [id number] DOB: March 20, 1951  12/19/2019  Mr. Michael Huffman was observed post Covid-19 immunization for 15 minutes without incident. He was provided with Vaccine Information Sheet and instruction to access the V-Safe system.   Mr. Michael Huffman was instructed to call 911 with any severe reactions post vaccine: Marland Kitchen Difficulty breathing  . Swelling of face and throat  . A fast heartbeat  . A bad rash all over body  . Dizziness and weakness   Immunizations Administered    Name Date Dose VIS Date Route   Pfizer COVID-19 Vaccine 12/19/2019  1:27 PM 0.3 mL 09/01/2019 Intramuscular   Manufacturer: [employer]   Lot: [id number]   [place]: [id number]

## 2020-04-11 DIAGNOSIS — M545 Low back pain: Secondary | ICD-10-CM | POA: Diagnosis not present

## 2020-04-11 DIAGNOSIS — M25562 Pain in left knee: Secondary | ICD-10-CM | POA: Diagnosis not present

## 2020-04-26 DIAGNOSIS — H524 Presbyopia: Secondary | ICD-10-CM | POA: Diagnosis not present

## 2020-04-26 DIAGNOSIS — H353222 Exudative age-related macular degeneration, left eye, with inactive choroidal neovascularization: Secondary | ICD-10-CM | POA: Diagnosis not present

## 2020-04-26 DIAGNOSIS — H04123 Dry eye syndrome of bilateral lacrimal glands: Secondary | ICD-10-CM | POA: Diagnosis not present

## 2020-06-05 DIAGNOSIS — K219 Gastro-esophageal reflux disease without esophagitis: Secondary | ICD-10-CM | POA: Diagnosis not present

## 2020-06-05 DIAGNOSIS — Z86010 Personal history of colonic polyps: Secondary | ICD-10-CM | POA: Diagnosis not present

## 2020-07-05 ENCOUNTER — Encounter: Payer: Self-pay | Admitting: Internal Medicine

## 2020-07-05 ENCOUNTER — Other Ambulatory Visit: Payer: Self-pay

## 2020-07-05 ENCOUNTER — Inpatient Hospital Stay: Payer: Medicare Other | Attending: Internal Medicine

## 2020-07-05 ENCOUNTER — Inpatient Hospital Stay (HOSPITAL_BASED_OUTPATIENT_CLINIC_OR_DEPARTMENT_OTHER): Payer: Medicare Other | Admitting: Internal Medicine

## 2020-07-05 DIAGNOSIS — M1712 Unilateral primary osteoarthritis, left knee: Secondary | ICD-10-CM | POA: Insufficient documentation

## 2020-07-05 DIAGNOSIS — C884 Extranodal marginal zone B-cell lymphoma of mucosa-associated lymphoid tissue [MALT-lymphoma]: Secondary | ICD-10-CM

## 2020-07-05 DIAGNOSIS — Z87891 Personal history of nicotine dependence: Secondary | ICD-10-CM | POA: Diagnosis not present

## 2020-07-05 DIAGNOSIS — Z8572 Personal history of non-Hodgkin lymphomas: Secondary | ICD-10-CM | POA: Insufficient documentation

## 2020-07-05 DIAGNOSIS — Z8719 Personal history of other diseases of the digestive system: Secondary | ICD-10-CM | POA: Insufficient documentation

## 2020-07-05 DIAGNOSIS — Z923 Personal history of irradiation: Secondary | ICD-10-CM | POA: Diagnosis not present

## 2020-07-05 LAB — CBC WITH DIFFERENTIAL/PLATELET
Abs Immature Granulocytes: 0.02 10*3/uL (ref 0.00–0.07)
Basophils Absolute: 0 10*3/uL (ref 0.0–0.1)
Basophils Relative: 1 %
Eosinophils Absolute: 0.1 10*3/uL (ref 0.0–0.5)
Eosinophils Relative: 3 %
HCT: 47.3 % (ref 39.0–52.0)
Hemoglobin: 15.9 g/dL (ref 13.0–17.0)
Immature Granulocytes: 0 %
Lymphocytes Relative: 28 %
Lymphs Abs: 1.3 10*3/uL (ref 0.7–4.0)
MCH: 28.5 pg (ref 26.0–34.0)
MCHC: 33.6 g/dL (ref 30.0–36.0)
MCV: 84.8 fL (ref 80.0–100.0)
Monocytes Absolute: 0.5 10*3/uL (ref 0.1–1.0)
Monocytes Relative: 10 %
Neutro Abs: 2.7 10*3/uL (ref 1.7–7.7)
Neutrophils Relative %: 58 %
Platelets: 222 10*3/uL (ref 150–400)
RBC: 5.58 MIL/uL (ref 4.22–5.81)
RDW: 12.6 % (ref 11.5–15.5)
WBC: 4.6 10*3/uL (ref 4.0–10.5)
nRBC: 0 % (ref 0.0–0.2)

## 2020-07-05 LAB — COMPREHENSIVE METABOLIC PANEL
ALT: 43 U/L (ref 0–44)
AST: 31 U/L (ref 15–41)
Albumin: 4.3 g/dL (ref 3.5–5.0)
Alkaline Phosphatase: 94 U/L (ref 38–126)
Anion gap: 8 (ref 5–15)
BUN: 16 mg/dL (ref 8–23)
CO2: 30 mmol/L (ref 22–32)
Calcium: 9.3 mg/dL (ref 8.9–10.3)
Chloride: 102 mmol/L (ref 98–111)
Creatinine, Ser: 0.68 mg/dL (ref 0.61–1.24)
GFR, Estimated: >60 mL/min (ref >60)
Glucose, Bld: 112 mg/dL — ABNORMAL HIGH (ref 70–99)
Potassium: 5.1 mmol/L (ref 3.5–5.1)
Sodium: 140 mmol/L (ref 135–145)
Total Bilirubin: 1.5 mg/dL — ABNORMAL HIGH (ref 0.3–1.2)
Total Protein: 7 g/dL (ref 6.5–8.1)

## 2020-07-05 LAB — LACTATE DEHYDROGENASE: LDH: 136 U/L (ref 98–192)

## 2020-07-05 NOTE — Assessment & Plan Note (Addendum)
#   Right forearm nodular lesion-Status post punch Biopsy- late April 2017 [[place]]- CUTANEOUS MARGINAL ZONE B CELL LYMPHOMA. S/p RT May 2017.    # Clinically no evidence of recurrence.  STABLE  # Left knee arthritis-awaiting knee replacement.   DISPOSITION:  # follow up in 1 year- MD -cbc/cmp/ldh/MD- Dr.B  Cc; Dr.Hussian; [place]/[facility].

## 2020-07-05 NOTE — Progress Notes (Signed)
[facility] NOTE  Patient Care Team: Wenda Low, MD as PCP - General (Internal Medicine) Druscilla Brownie, MD as Consulting Physician (Dermatology)  CHIEF COMPLAINTS/PURPOSE OF CONSULTATION:   Oncology History Overview Note  # CUTANEOUS LYMPHOMA- [face & Right thigh s/p RT 2006; Dr.Gold; [place]]; 2012-Right Fore Arm knot [s/p Bx- May 2015 nodular dense lymphocytic infiltrate s/o Immunocytoma]  # April 2017 CUTANEOUS MARGINAL ZONE B CELL LYMPHOMA. [Dr.Lupton; [place]] RIGHT FOREARM s/p punch Biopsy; s/p RT [facility] 2017. 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  male with above history of  cutaneous marginal low-grade lymphoma right forearm at [facility] in April 2017; status post radiation in May 2017.  Patient continues to follow closely dermatology; states to have been excisional biopsy of the lesion on the right neck which was benign as per patient.  Is awaiting upper and lower endoscopy the end of the month.  Patient denies otherwise any new lumps or bumps.  Appetite is good.  No weight loss.   Review of Systems  Constitutional: Negative for chills, diaphoresis, fever, malaise/fatigue and weight loss.  HENT: Negative for nosebleeds and sore throat.   Eyes: Negative for double vision.  Respiratory: Negative for cough, hemoptysis, sputum production, shortness of breath and wheezing.   Cardiovascular: Negative for chest pain, palpitations, orthopnea and leg swelling.  Gastrointestinal: Negative for abdominal pain, blood in stool, constipation, diarrhea, heartburn, melena, nausea and vomiting.  Genitourinary: Negative for dysuria, frequency and urgency.  Musculoskeletal:  Positive for back pain and joint pain.  Skin: Negative.  Negative for itching and rash.  Neurological: Negative for dizziness, tingling, focal weakness, weakness and headaches.  Endo/Heme/Allergies: Does not bruise/bleed easily.  Psychiatric/Behavioral: Negative for depression. The patient is not nervous/anxious and does not have insomnia.      MEDICAL HISTORY:  Past Medical History:  Diagnosis Date  . Chronically dry eyes, bilateral   . GERD (gastroesophageal reflux disease)   . Hiatal hernia   . History of colon polyps 01/12/2013   hyperplastic  . History of external beam radiation therapy    face and right thigh , completed 2006 /  right forearm ,  4000 Gy,  completed 06/ 2017  . History of kidney stones   . History of radiation therapy    of lymphoma on skin: face and leg  . Hyperlipidemia   . Hypertension   . 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 Nephrolithiasis    bilateral   . OSA on CPAP   . Primary cutaneous lymphoma [facility]) oncologist-  dr Charlaine Dalton (cancer center [place])--- per lov note 10--04-2017 -- no recurrence clinically   dx 2006 -- face and right thigh cutaneous lymphoma s/p radiation therapy (done in [place]);  2012 right forearm knot s/p bx- may 2015 nodular lymphocytic infiltrate s/o immunocytoma;  04/ 2017  cutaneous marginal zone B Cell lymphoma , right forearm (punch bx [id number])  s/p  radiation therapy 06/ 2017  . SSBE (short-segment Barrett's esophagus)     SURGICAL HISTORY: Past Surgical History:  Procedure Laterality Date  . COLONOSCOPY WITH ESOPHAGOGASTRODUODENOSCOPY (EGD)  01/12/2014   in [place]  . CYSTOSCOPY/URETEROSCOPY/HOLMIUM LASER/STENT PLACEMENT Bilateral 08/04/2017  Procedure: CYSTOSCOPY/ RETROGRADE/URETEROSCOPY/HOLMIUM LASER/STENT PLACEMENT;  Surgeon: Ceasar Mons, MD;  Location: [facility];  Service: Urology;  Laterality: Bilateral;  . EXTRACORPOREAL SHOCK WAVE LITHOTRIPSY  1990s  . HYDROCELE  EXCISION Left 2000  approx.  . TONSILLECTOMY  child  . TOTAL KNEE ARTHROPLASTY Right 04/01/2018   Procedure: RIGHT TOTAL KNEE ARTHROPLASTY;  Surgeon: Netta Cedars, MD;  Location: [facility];  Service: Orthopedics;  Laterality: Right;    SOCIAL HISTORY: moved from [place].  Social History   Socioeconomic History  . Marital status: Married    Spouse name: Not on file  . Number of children: Not on file  . Years of education: Not on file  . Highest education level: Not on file  Occupational History  . Not on file  Tobacco Use  . Smoking status: Former Smoker    Packs/day: 2.00    Years: 22.00    Pack years: 44.00    Types: Cigarettes    Quit date: 08/22/1989    Years since quitting: 30.8  . Smokeless tobacco: Never Used  Vaping Use  . Vaping Use: Never used  Substance and Sexual Activity  . Alcohol use: Yes    Alcohol/week: 8.0 standard drinks    Types: 7 Glasses of wine, 1 Shots of liquor per week    Comment: daily  wine and martini on the weekend.  . Drug use: No  . Sexual activity: Not on file  Other Topics Concern  . Not on file  Social History Narrative  . Not on file   Social Determinants of Health   Financial Resource Strain:   . Difficulty of Paying Living Expenses: Not on file  Food Insecurity:   . Worried About Charity fundraiser in the Last Year: Not on file  . Ran Out of Food in the Last Year: Not on file  Transportation Needs:   . Lack of Transportation (Medical): Not on file  . Lack of Transportation (Non-Medical): Not on file  Physical Activity:   . Days of Exercise per Week: Not on file  . Minutes of Exercise per Session: Not on file  Stress:   . Feeling of Stress : Not on file  Social Connections:   . Frequency of Communication with Friends and Family: Not on file  . Frequency of Social Gatherings with Friends and Family: Not on file  . Attends Religious Services: Not on file  . Active Member of Clubs or Organizations: Not on file  . Attends English as a second language teacher Meetings: Not on file  . Marital Status: Not on file  Intimate Partner Violence:   . Fear of Current or Ex-Partner: Not on file  . Emotionally Abused: Not on file  . Physically Abused: Not on file  . Sexually Abused: Not on file    FAMILY HISTORY: Family History  Problem Relation Age of Onset  . Diabetes Father   . Heart murmur Mother   . Kidney disease Mother   . Aneurysm Mother   . Diabetes Brother     ALLERGIES:  has No Known Allergies.  MEDICATIONS:  Current Outpatient Medications  Medication Sig Dispense Refill  . Fish Oil-Cholecalciferol (OMEGA-3 + VITAMIN D3 PO) Take 4 capsules by mouth daily.    . hydrochlorothiazide (HYDRODIURIL) 12.5 MG tablet Take 1 tablet by mouth daily.    . Liniments (BLUE-EMU SUPER STRENGTH) CREA Apply 1 application topically daily as needed (pain).     Marland Kitchen losartan (COZAAR) 100 MG tablet Take 1 tablet  by mouth daily.    . naproxen sodium (ANAPROX) 220 MG tablet Take 220 mg by mouth daily as needed (pain).     Marland Kitchen omeprazole (PRILOSEC) 20 MG capsule Take 20 mg by mouth every evening.     Vladimir Faster Glycol-Propyl Glycol (SYSTANE OP) Apply 1 drop to eye 4 (four) times daily. Each eye     . rosuvastatin (CRESTOR) 5 MG tablet Take 5 mg by mouth every morning.   2   No current facility-administered medications for this visit.      Marland Kitchen  PHYSICAL EXAMINATION: ECOG PERFORMANCE STATUS: 0 - Asymptomatic  Vitals:   07/05/20 1024  BP: (!) 144/88  Pulse: (!) 53  Resp: 20  Temp: (!) 96.7 F (35.9 C)   Filed Weights   07/05/20 1024  Weight: 255 lb (115.7 kg)    Physical Exam HENT:     Head: Normocephalic and atraumatic.     Mouth/Throat:     Pharynx: No oropharyngeal exudate.  Eyes:     Pupils: Pupils are equal, round, and reactive to light.  Cardiovascular:     Rate and Rhythm: Normal rate and regular rhythm.  Pulmonary:     Effort: No respiratory distress.     Breath sounds: No wheezing.  Abdominal:     General: Bowel  sounds are normal. There is no distension.     Palpations: Abdomen is soft. There is no mass.     Tenderness: There is no abdominal tenderness. There is no guarding or rebound.  Musculoskeletal:        General: No tenderness. Normal range of motion.     Cervical back: Normal range of motion and neck supple.  Skin:    General: Skin is warm.  Neurological:     Mental Status: He is alert and oriented to person, place, and time.  Psychiatric:        Mood and Affect: Affect normal.      LABORATORY DATA:  I have reviewed the data as listed Lab Results  Component Value Date   WBC 4.6 07/05/2020   HGB 15.9 07/05/2020   HCT 47.3 07/05/2020   MCV 84.8 07/05/2020   PLT 222 07/05/2020   Recent Labs    07/05/20 0951  NA 140  K 5.1  CL 102  CO2 30  GLUCOSE 112*  BUN 16  CREATININE 0.68  CALCIUM 9.3  GFRNONAA >60  PROT 7.0  ALBUMIN 4.3  AST 31  ALT 43  ALKPHOS 94  BILITOT 1.5*     ASSESSMENT & PLAN:   Primary cutaneous marginal zone B-cell lymphoma (HCC) # Right forearm nodular lesion-Status post punch Biopsy- late April 2017 [[place]]- CUTANEOUS MARGINAL ZONE B CELL LYMPHOMA. S/p RT May 2017.    # Clinically no evidence of recurrence.  STABLE  # Left knee arthritis-awaiting knee replacement.   DISPOSITION:  # follow up in 1 year- MD -cbc/cmp/ldh/MD- Dr.B  Cc; Dr.Hussian; [place]/[facility].      Cammie Sickle, MD 07/05/2020 11:08 AM

## 2020-07-08 DIAGNOSIS — Z23 Encounter for immunization: Secondary | ICD-10-CM | POA: Diagnosis not present

## 2020-07-24 DIAGNOSIS — Z1159 Encounter for screening for other viral diseases: Secondary | ICD-10-CM | POA: Diagnosis not present

## 2020-07-29 DIAGNOSIS — Z13810 Encounter for screening for upper gastrointestinal disorder: Secondary | ICD-10-CM | POA: Diagnosis not present

## 2020-07-29 DIAGNOSIS — D122 Benign neoplasm of ascending colon: Secondary | ICD-10-CM | POA: Diagnosis not present

## 2020-07-29 DIAGNOSIS — D124 Benign neoplasm of descending colon: Secondary | ICD-10-CM | POA: Diagnosis not present

## 2020-07-29 DIAGNOSIS — K2970 Gastritis, unspecified, without bleeding: Secondary | ICD-10-CM | POA: Diagnosis not present

## 2020-07-29 DIAGNOSIS — K621 Rectal polyp: Secondary | ICD-10-CM | POA: Diagnosis not present

## 2020-07-29 DIAGNOSIS — K2289 Other specified disease of esophagus: Secondary | ICD-10-CM | POA: Diagnosis not present

## 2020-07-29 DIAGNOSIS — Z86010 Personal history of colonic polyps: Secondary | ICD-10-CM | POA: Diagnosis not present

## 2020-07-29 DIAGNOSIS — K2930 Chronic superficial gastritis without bleeding: Secondary | ICD-10-CM | POA: Diagnosis not present

## 2020-07-29 DIAGNOSIS — K449 Diaphragmatic hernia without obstruction or gangrene: Secondary | ICD-10-CM | POA: Diagnosis not present

## 2020-07-29 DIAGNOSIS — R12 Heartburn: Secondary | ICD-10-CM | POA: Diagnosis not present

## 2020-07-29 DIAGNOSIS — K2270 Barrett's esophagus without dysplasia: Secondary | ICD-10-CM | POA: Diagnosis not present

## 2020-07-29 DIAGNOSIS — K317 Polyp of stomach and duodenum: Secondary | ICD-10-CM | POA: Diagnosis not present

## 2020-08-01 DIAGNOSIS — K317 Polyp of stomach and duodenum: Secondary | ICD-10-CM | POA: Diagnosis not present

## 2020-08-01 DIAGNOSIS — K2270 Barrett's esophagus without dysplasia: Secondary | ICD-10-CM | POA: Diagnosis not present

## 2020-08-01 DIAGNOSIS — K621 Rectal polyp: Secondary | ICD-10-CM | POA: Diagnosis not present

## 2020-08-01 DIAGNOSIS — D122 Benign neoplasm of ascending colon: Secondary | ICD-10-CM | POA: Diagnosis not present

## 2020-08-01 DIAGNOSIS — K2930 Chronic superficial gastritis without bleeding: Secondary | ICD-10-CM | POA: Diagnosis not present

## 2020-08-01 DIAGNOSIS — D124 Benign neoplasm of descending colon: Secondary | ICD-10-CM | POA: Diagnosis not present

## 2020-08-14 DIAGNOSIS — M25562 Pain in left knee: Secondary | ICD-10-CM | POA: Diagnosis not present

## 2020-08-20 DIAGNOSIS — M1712 Unilateral primary osteoarthritis, left knee: Secondary | ICD-10-CM | POA: Diagnosis not present

## 2020-08-20 DIAGNOSIS — M170 Bilateral primary osteoarthritis of knee: Secondary | ICD-10-CM | POA: Diagnosis not present

## 2020-09-08 DIAGNOSIS — Z23 Encounter for immunization: Secondary | ICD-10-CM | POA: Diagnosis not present

## 2020-09-19 DIAGNOSIS — R7303 Prediabetes: Secondary | ICD-10-CM | POA: Diagnosis not present

## 2020-09-19 DIAGNOSIS — M179 Osteoarthritis of knee, unspecified: Secondary | ICD-10-CM | POA: Diagnosis not present

## 2020-09-19 DIAGNOSIS — C8590 Non-Hodgkin lymphoma, unspecified, unspecified site: Secondary | ICD-10-CM | POA: Diagnosis not present

## 2020-09-19 DIAGNOSIS — I10 Essential (primary) hypertension: Secondary | ICD-10-CM | POA: Diagnosis not present

## 2020-09-19 DIAGNOSIS — G4733 Obstructive sleep apnea (adult) (pediatric): Secondary | ICD-10-CM | POA: Diagnosis not present

## 2020-09-19 DIAGNOSIS — Z01818 Encounter for other preprocedural examination: Secondary | ICD-10-CM | POA: Diagnosis not present

## 2020-10-01 NOTE — Progress Notes (Signed)
Please enter orders for PAT visit scheduled for 07-24-21 

## 2020-10-01 NOTE — Progress Notes (Signed)
COVID Vaccine Completed:  x2 Date COVID Vaccine completed:  11-23-19 & 12-19-19 COVID vaccine manufacturer: [place]     PCP - Wenda Low, MD Cardiologist -   Chest x-ray -  EKG -  Stress Test -  ECHO -  Cardiac Cath -  Pacemaker/ICD device last checked:  Sleep Study -  CPAP -   Fasting Blood Sugar -  Checks Blood Sugar _____ times a day  Blood Thinner Instructions: Aspirin Instructions: Last Dose:  Anesthesia review:   Patient denies shortness of breath, fever, cough and chest pain at PAT appointment   Patient verbalized understanding of instructions that were given to them at the PAT appointment. Patient was also instructed that they will need to review over the PAT instructions again at home before surgery.

## 2020-10-01 NOTE — Patient Instructions (Signed)
DUE TO COVID-19 ONLY ONE VISITOR IS ALLOWED TO COME WITH YOU AND STAY IN THE WAITING ROOM ONLY DURING PRE OP AND PROCEDURE.   IF YOU WILL BE ADMITTED INTO THE HOSPITAL YOU ARE ALLOWED ONE SUPPORT PERSON DURING VISITATION HOURS ONLY (10AM -8PM)   . The support person may change daily. . The support person must pass our screening, gel in and out, and wear a mask at all times, including in the patient's room. . Patients must also wear a mask when staff or their support person are in the room.   COVID SWAB TESTING MUST BE COMPLETED ON:  Tuesday, 10-08-20 @    [address] [place], [place] [address]  (Must self quarantine after testing. Follow instructions on handout.)        Your procedure is scheduled on:  Friday, 10-11-20   Report to [facility] Main  Entrance   Report to admitting at 7:30 AM   Call this number if you have problems the morning of surgery [phone]   Do not eat food :After Midnight.   May have liquids until 7:00 AM  day of surgery  CLEAR LIQUID DIET  Foods Allowed                                                                     Foods Excluded  Water, Black Coffee and tea, regular and decaf           liquids that you cannot  Plain Jell-O in any flavor  (No red)                                  see through such as: Fruit ices (not with fruit pulp)                                      milk, soups, orange juice              Iced Popsicles (No red)                                      All solid food                                   Apple juices Sports drinks like Gatorade (No red) Lightly seasoned clear broth or consume(fat free) Sugar, honey syrup     Complete one Ensure drink the morning of surgery at 7:00 AM the day of surgery.      1. The day of surgery:  ? Drink ONE (1) Pre-Surgery Clear Ensure or G2 by am the morning of surgery. Drink in one sitting. Do not sip.  ? This drink was given to you during your hospital  pre-op appointment  visit. ? Nothing else to drink after completing the  Pre-Surgery Clear Ensure or G2.          If you have questions, please contact your surgeon's office.  Oral Hygiene is also important to reduce your risk of infection.                                    Remember - BRUSH YOUR TEETH THE MORNING OF SURGERY WITH YOUR REGULAR TOOTHPASTE   Do NOT smoke after Midnight   Take these medicines the morning of surgery with A SIP OF WATER:   Omeprazole, Rosuvastatin                                You may not have any metal on your body including  jewelry, and body piercings             Do not wear lotions, powders, perfumes/cologne, or deodorant             Men may shave face and neck.   Do not bring valuables to the hospital. [place].   Contacts, dentures or bridgework may not be worn into surgery.   Bring small overnight bag day of surgery.     Special Instructions: Bring a copy of your healthcare power of attorney and living will documents         the day of surgery if you haven't scanned them in before.              Please read over the following fact sheets you were given: IF YOU HAVE QUESTIONS ABOUT YOUR PRE OP INSTRUCTIONS PLEASE CALL [phone]   [facility] - Preparing for Surgery Before surgery, you can play an important role.  Because skin is not sterile, your skin needs to be as free of germs as possible.  You can reduce the number of germs on your skin by washing with CHG (chlorahexidine gluconate) soap before surgery.  CHG is an antiseptic cleaner which kills germs and bonds with the skin to continue killing germs even after washing. Please DO NOT use if you have an allergy to CHG or antibacterial soaps.  If your skin becomes reddened/irritated stop using the CHG and inform your nurse when you arrive at Short Stay. Do not shave (including legs and underarms) for at least 48 hours prior to the first CHG shower.  You may shave your  face/neck.  Please follow these instructions carefully:  1.  Shower with CHG Soap the night before surgery and the  morning of surgery.  2.  If you choose to wash your hair, wash your hair first as usual with your normal  shampoo.  3.  After you shampoo, rinse your hair and body thoroughly to remove the shampoo.                             4.  Use CHG as you would any other liquid soap.  You can apply chg directly to the skin and wash.  Gently with a scrungie or clean washcloth.  5.  Apply the CHG Soap to your body ONLY FROM THE NECK DOWN.   Do   not use on face/ open                           Wound or open sores. Avoid contact with eyes, ears mouth and   genitals (private parts).  Wash face,  Genitals (private parts) with your normal soap.             6.  Wash thoroughly, paying special attention to the area where your    surgery  will be performed.  7.  Thoroughly rinse your body with warm water from the neck down.  8.  DO NOT shower/wash with your normal soap after using and rinsing off the CHG Soap.                9.  Pat yourself dry with a clean towel.            10.  Wear clean pajamas.            11.  Place clean sheets on your bed the night of your first shower and do not  sleep with pets. Day of Surgery : Do not apply any lotions/deodorants the morning of surgery.  Please wear clean clothes to the hospital/surgery center.  FAILURE TO FOLLOW THESE INSTRUCTIONS MAY RESULT IN THE CANCELLATION OF YOUR SURGERY  PATIENT SIGNATURE_________________________________  NURSE SIGNATURE__________________________________  ________________________________________________________________________

## 2020-10-03 ENCOUNTER — Encounter (HOSPITAL_COMMUNITY)
Admission: RE | Admit: 2020-10-03 | Discharge: 2020-10-03 | Disposition: A | Discharge status: Home / Self Care | Payer: PRIVATE HEALTH INSURANCE | Source: Ambulatory Visit | Attending: Anesthesiology | Admitting: Anesthesiology

## 2020-10-08 NOTE — H&P (Signed)
Patient's anticipated LOS is less than 2 midnights, meeting these requirements: - Younger than 2 - Lives within 1 hour of care - Has a competent adult at home to recover with post-op recover - NO history of  - Chronic pain requiring opiods  - Diabetes  - Coronary Artery Disease  - Heart failure  - Heart attack  - Stroke  - DVT/VTE  - Cardiac arrhythmia  - Respiratory Failure/COPD  - Renal failure  - Anemia  - Advanced Liver disease       Michael Huffman is an 70 y.o. male.    Chief Complaint: left knee pain  HPI: Pt is a 70 y.o. male complaining of left knee pain for multiple years. Pain had continually increased since the beginning. 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 Pt has tried various conservative treatments which have failed to alleviate their symptoms, including injections and therapy. Various options are discussed with the patient. Risks, benefits and expectations were discussed with the patient. Patient understand the risks, benefits and expectations and wishes to proceed with surgery.   PCP:  Wenda Low, MD  D/C Plans: Home  PMH: Past Medical History:  Diagnosis Date  . Chronically dry eyes, bilateral   . GERD (gastroesophageal reflux disease)   . Hiatal hernia   . History of colon polyps 01/12/2013   hyperplastic  . History of external beam radiation therapy    face and right thigh , completed 2006 /  right forearm ,  4000 Gy,  completed 06/ 2017  . History of kidney stones   . History of radiation therapy    of lymphoma on skin: face and leg  . Hyperlipidemia   . Hypertension   . 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 Nephrolithiasis    bilateral   . OSA on CPAP   . 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 SSBE (short-segment Barrett's esophagus)     PSH: Past Surgical History:  Procedure Laterality Date  . COLONOSCOPY WITH ESOPHAGOGASTRODUODENOSCOPY (EGD)  01/12/2014   in [place]  . CYSTOSCOPY/URETEROSCOPY/HOLMIUM LASER/STENT PLACEMENT Bilateral 08/04/2017   Procedure: CYSTOSCOPY/ RETROGRADE/URETEROSCOPY/HOLMIUM LASER/STENT PLACEMENT;  Surgeon: Ceasar Mons, MD;  Location: [facility];  Service: Urology;  Laterality: Bilateral;  . EXTRACORPOREAL SHOCK WAVE LITHOTRIPSY  1990s  . HYDROCELE EXCISION Left 2000  approx.  . TONSILLECTOMY  child  . TOTAL KNEE ARTHROPLASTY Right 04/01/2018   Procedure: RIGHT TOTAL KNEE ARTHROPLASTY;  Surgeon: Netta Cedars, MD;  Location: [facility];  Service: Orthopedics;  Laterality: Right;    Social History:  reports that he quit smoking about 31 years ago. His smoking use included cigarettes. He has a 44.00 pack-year smoking history. He has never used smokeless tobacco. He reports current alcohol use of about 8.0 standard drinks of alcohol per week. He reports that he does not use drugs.  Allergies:  No Known Allergies  Medications: No current facility-administered medications for this encounter.   Current Outpatient Medications  Medication Sig Dispense Refill  . Fish Oil-Cholecalciferol (OMEGA-3 + VITAMIN  D3 PO) Take 2 capsules by mouth 2 (two) times daily.    . hydrochlorothiazide (HYDRODIURIL) 12.5 MG tablet Take 12.5 mg by mouth daily.    . Liniments (BLUE-EMU SUPER STRENGTH) CREA Apply 1 application topically daily as needed (pain).     Marland Kitchen losartan (COZAAR) 100 MG tablet Take 100 mg by mouth daily.    . Multiple Vitamins-Minerals (MULTIVITAMIN WITH MINERALS) tablet Take 2 tablets by mouth daily.    . Multiple  Vitamins-Minerals (PRESERVISION AREDS 2) CAPS Take 2 capsules by mouth daily at 12 noon.    . naproxen sodium (ANAPROX) 220 MG tablet Take 440 mg by mouth daily as needed (pain).    Marland Kitchen omeprazole (PRILOSEC) 20 MG capsule Take 20 mg by mouth every evening.     Vladimir Faster Glycol-Propyl Glycol (SYSTANE OP) Place 1 drop into both eyes 3 (three) times daily. Each eye    . rosuvastatin (CRESTOR) 5 MG tablet Take 5 mg by mouth daily.  2    No results found for this or any previous visit (from the past 48 hour(s)). No results found.  ROS: Pain with rom of the left lower extremity  Physical Exam: Alert and oriented 70 y.o. male in no acute distress Cranial nerves 2-12 intact Cervical spine: full rom with no tenderness, nv intact distally Chest: active breath sounds bilaterally, no wheeze rhonchi or rales Heart: regular rate and rhythm, no murmur Abd: non tender non distended with active bowel sounds Hip is stable with rom  Left knee painful rom with crepitus nv intact distally No rashes or edema Antalgic gait  Assessment/Plan Assessment: left total knee arthroplasty  Plan:  Patient will undergo a left total knee by Dr. Veverly Fells at [facility] Risks benefits and expectations were discussed with the patient. Patient understand risks, benefits and expectations and wishes to proceed. Preoperative templating of the joint replacement has been completed, documented, and submitted to the Operating Room personnel in order to optimize intra-operative equipment management.   Merla Riches PA-C, [facility] is now [employer] [address], [place], [place], [place] [address] Phone: [phone] [email or web address] Facebook  [employer]

## 2020-10-18 ENCOUNTER — Encounter (HOSPITAL_COMMUNITY): Admission: RE | Admit: 2020-10-18 | Payer: Medicare Other | Source: Ambulatory Visit

## 2020-10-25 ENCOUNTER — Ambulatory Visit: Admit: 2020-10-25 | Payer: PRIVATE HEALTH INSURANCE | Admitting: Orthopedic Surgery

## 2020-10-25 SURGERY — ARTHROPLASTY, KNEE, TOTAL
Anesthesia: Spinal | Site: Knee | Laterality: Left

## 2020-11-06 NOTE — Patient Instructions (Signed)
DUE TO COVID-19 ONLY ONE VISITOR IS ALLOWED TO COME WITH YOU AND STAY IN THE WAITING ROOM ONLY DURING PRE OP AND PROCEDURE DAY OF SURGERY. THE 1 VISITOR  MAY VISIT WITH YOU AFTER SURGERY IN YOUR PRIVATE ROOM DURING VISITING HOURS ONLY!  YOU NEED TO HAVE A COVID 19 TEST ON__2-22-22_____ @_______ , THIS TEST MUST BE DONE BEFORE SURGERY,  COVID TESTING SITE [address] [place] [address], IT IS ON THE RIGHT GOING OUT [address] APPROXIMATELY  2 MINUTES PAST [employer] ON THE RIGHT. ONCE YOUR COVID TEST IS COMPLETED,  PLEASE BEGIN THE QUARANTINE INSTRUCTIONS AS OUTLINED IN YOUR HANDOUT.                Michael Huffman  11/06/2020   Your procedure is scheduled on: 11-15-20   Report to [facility] Main  Entrance   Report to admitting at       0530 AM     Call this number if you have problems the morning of surgery [phone]    Remember: NO SOLID FOOD AFTER MIDNIGHT THE NIGHT PRIOR TO SURGERY. NOTHING BY MOUTH EXCEPT CLEAR LIQUIDS UNTIL  0430 am . PLEASE FINISH ENSURE DRINK PER SURGEON ORDER  WHICH NEEDS TO BE COMPLETED AT       0430 am then nothing by mouth .    CLEAR LIQUID DIET   Foods Allowed                                                                           Black Coffee and tea, regular and decaf                             Plain Jell-O any favor except red or purple                                           Fruit ices (not with fruit pulp)                                      Iced Popsicles                                     Carbonated beverages, regular and diet                                    Cranberry, grape and apple juices Sports drinks like Gatorade Lightly seasoned clear broth or consume(fat free) Sugar, honey syrup   _____________________________________________________________________   BRUSH YOUR TEETH MORNING OF SURGERY AND RINSE YOUR MOUTH OUT, NO CHEWING GUM CANDY OR MINTS.     Take these medicines the morning of  surgery with A SIP OF WATER: eye drops, crestor  You may not have any metal on your body including hair pins and              piercings  Do not wear jewelry,  lotions, powders or perfumes, deodorant              Men may shave face and neck.   Do not bring valuables to the hospital. [place].  Contacts, dentures or bridgework may not be worn into surgery.  Leave suitcase in the car. After surgery it may be brought to your room.     Patients discharged the day of surgery will not be allowed to drive home. IF YOU ARE HAVING SURGERY AND GOING HOME THE SAME DAY, YOU MUST HAVE AN ADULT TO DRIVE YOU HOME AND BE WITH YOU FOR 24 HOURS. YOU MAY GO HOME BY TAXI OR UBER OR ORTHERWISE, BUT AN ADULT MUST ACCOMPANY YOU HOME AND STAY WITH YOU FOR 24 HOURS.  Name and phone number of your driver:  Special Instructions: N/A              Please read over the following fact sheets you were given: _____________________________________________________________________             [facility] - Preparing for Surgery Before surgery, you can play an important role.  Because skin is not sterile, your skin needs to be as free of germs as possible.  You can reduce the number of germs on your skin by washing with CHG (chlorahexidine gluconate) soap before surgery.  CHG is an antiseptic cleaner which kills germs and bonds with the skin to continue killing germs even after washing. Please DO NOT use if you have an allergy to CHG or antibacterial soaps.  If your skin becomes reddened/irritated stop using the CHG and inform your nurse when you arrive at Short Stay. Do not shave (including legs and underarms) for at least 48 hours prior to the first CHG shower.  You may shave your face/neck. Please follow these instructions carefully:  1.  Shower with CHG Soap the night before surgery and the  morning of Surgery.  2.  If you choose to wash  your hair, wash your hair first as usual with your  normal  shampoo.  3.  After you shampoo, rinse your hair and body thoroughly to remove the  shampoo.                           4.  Use CHG as you would any other liquid soap.  You can apply chg directly  to the skin and wash                       Gently with a scrungie or clean washcloth.  5.  Apply the CHG Soap to your body ONLY FROM THE NECK DOWN.   Do not use on face/ open                           Wound or open sores. Avoid contact with eyes, ears mouth and genitals (private parts).                       Wash face,  Genitals (private parts) with your normal soap.  6.  Wash thoroughly, paying special attention to the area where your surgery  will be performed.  7.  Thoroughly rinse your body with warm water from the neck down.  8.  DO NOT shower/wash with your normal soap after using and rinsing off  the CHG Soap.                9.  Pat yourself dry with a clean towel.            10.  Wear clean pajamas.            11.  Place clean sheets on your bed the night of your first shower and do not  sleep with pets. Day of Surgery : Do not apply any lotions/deodorants the morning of surgery.  Please wear clean clothes to the hospital/surgery center.  FAILURE TO FOLLOW THESE INSTRUCTIONS MAY RESULT IN THE CANCELLATION OF YOUR SURGERY PATIENT SIGNATURE_________________________________  NURSE SIGNATURE__________________________________  ________________________________________________________________________   Adam Phenix  An incentive spirometer is a tool that can help keep your lungs clear and active. This tool measures how well you are filling your lungs with each breath. Taking long deep breaths may help reverse or decrease the chance of developing breathing (pulmonary) problems (especially infection) following:  A long period of time when you are unable to move or be active. BEFORE THE PROCEDURE   If the spirometer  includes an indicator to show your best effort, your nurse or respiratory therapist will set it to a desired goal.  If possible, sit up straight or lean slightly forward. Try not to slouch.  Hold the incentive spirometer in an upright position. INSTRUCTIONS FOR USE  1. Sit on the edge of your bed if possible, or sit up as far as you can in bed or on a chair. 2. Hold the incentive spirometer in an upright position. 3. Breathe out normally. 4. Place the mouthpiece in your mouth and seal your lips tightly around it. 5. Breathe in slowly and as deeply as possible, raising the piston or the ball toward the top of the column. 6. Hold your breath for 3-5 seconds or for as long as possible. Allow the piston or ball to fall to the bottom of the column. 7. Remove the mouthpiece from your mouth and breathe out normally. 8. Rest for a few seconds and repeat Steps 1 through 7 at least 10 times every 1-2 hours when you are awake. Take your time and take a few normal breaths between deep breaths. 9. The spirometer may include an indicator to show your best effort. Use the indicator as a goal to work toward during each repetition. 10. After each set of 10 deep breaths, practice coughing to be sure your lungs are clear. If you have an incision (the cut made at the time of surgery), support your incision when coughing by placing a pillow or rolled up towels firmly against it. Once you are able to get out of bed, walk around indoors and cough well. You may stop using the incentive spirometer when instructed by your caregiver.  RISKS AND COMPLICATIONS  Take your time so you do not get dizzy or light-headed.  If you are in pain, you may need to take or ask for pain medication before doing incentive spirometry. It is harder to take a deep breath if you are having pain. AFTER USE  Rest and breathe slowly and easily.  It can be helpful to keep track of a log of your  progress. Your caregiver can provide you with a  simple table to help with this. If you are using the spirometer at home, follow these instructions: [place] IF:   You are having difficultly using the spirometer.  You have trouble using the spirometer as often as instructed.  Your pain medication is not giving enough relief while using the spirometer.  You develop fever of 100.5 F (38.1 C) or higher. SEEK IMMEDIATE MEDICAL CARE IF:   You cough up bloody sputum that had not been present before.  You develop fever of 102 F (38.9 C) or greater.  You develop worsening pain at or near the incision site. MAKE SURE YOU:   Understand these instructions.  Will watch your condition.  Will get help right away if you are not doing well or get worse. Document Released: 01/18/2007 Document Revised: 11/30/2011 Document Reviewed: 03/21/2007 [facility] Patient Information 2014 [place], [place].   ________________________________________________________________________

## 2020-11-06 NOTE — Progress Notes (Signed)
PCP - Wenda Low, MD Cardiologist - no  PPM/ICD -  Device Orders -  Rep Notified -   Chest x-ray -  EKG - 11-07-20 Stress Test -  ECHO -  Cardiac Cath -   Sleep Study -  CPAP - yes  Fasting Blood Sugar -  Checks Blood Sugar _____ times a day  Blood Thinner Instructions: Aspirin Instructions:  ERAS Protcol - PRE-SURGERY Ensure  Covid vaccine x 2 completed 2021 pfizer COVID TEST- 11-12-20  Activity--Walks dog 3 x a day no SOB Anesthesia review: HTN OSA   Patient denies shortness of breath, fever, cough and chest pain at PAT appointment  NONE   All instructions explained to the patient, with a verbal understanding of the material. Patient agrees to go over the instructions while at home for a better understanding. Patient also instructed to self quarantine after being tested for COVID-19. The opportunity to ask questions was provided.

## 2020-11-07 ENCOUNTER — Other Ambulatory Visit: Payer: Self-pay

## 2020-11-07 ENCOUNTER — Encounter (HOSPITAL_COMMUNITY)
Admission: RE | Admit: 2020-11-07 | Discharge: 2020-11-07 | Disposition: A | Discharge status: Home / Self Care | Payer: Medicare Other | Source: Ambulatory Visit | Attending: Orthopedic Surgery | Admitting: Orthopedic Surgery

## 2020-11-07 ENCOUNTER — Encounter (HOSPITAL_COMMUNITY): Payer: Self-pay

## 2020-11-07 DIAGNOSIS — Z01818 Encounter for other preprocedural examination: Secondary | ICD-10-CM | POA: Insufficient documentation

## 2020-11-07 LAB — CBC
HCT: 48.9 % (ref 39.0–52.0)
Hemoglobin: 16.1 g/dL (ref 13.0–17.0)
MCH: 28.4 pg (ref 26.0–34.0)
MCHC: 32.9 g/dL (ref 30.0–36.0)
MCV: 86.4 fL (ref 80.0–100.0)
Platelets: 238 10*3/uL (ref 150–400)
RBC: 5.66 MIL/uL (ref 4.22–5.81)
RDW: 12.9 % (ref 11.5–15.5)
WBC: 5.3 10*3/uL (ref 4.0–10.5)
nRBC: 0 % (ref 0.0–0.2)

## 2020-11-07 LAB — BASIC METABOLIC PANEL
Anion gap: 9 (ref 5–15)
BUN: 14 mg/dL (ref 8–23)
CO2: 28 mmol/L (ref 22–32)
Calcium: 9.2 mg/dL (ref 8.9–10.3)
Chloride: 103 mmol/L (ref 98–111)
Creatinine, Ser: 0.83 mg/dL (ref 0.61–1.24)
GFR, Estimated: >60 mL/min (ref >60)
Glucose, Bld: 106 mg/dL — ABNORMAL HIGH (ref 70–99)
Potassium: 4.3 mmol/L (ref 3.5–5.1)
Sodium: 140 mmol/L (ref 135–145)

## 2020-11-07 LAB — SURGICAL PCR SCREEN
MRSA, PCR: NEGATIVE
Staphylococcus aureus: NEGATIVE

## 2020-11-12 ENCOUNTER — Other Ambulatory Visit (HOSPITAL_COMMUNITY)
Admission: RE | Admit: 2020-11-12 | Discharge: 2020-11-12 | Disposition: A | Discharge status: Home / Self Care | Payer: Medicare Other | Source: Ambulatory Visit | Attending: Orthopedic Surgery | Admitting: Orthopedic Surgery

## 2020-11-12 DIAGNOSIS — Z01812 Encounter for preprocedural laboratory examination: Secondary | ICD-10-CM | POA: Insufficient documentation

## 2020-11-12 DIAGNOSIS — Z20822 Contact with and (suspected) exposure to covid-19: Secondary | ICD-10-CM | POA: Insufficient documentation

## 2020-11-12 LAB — SARS CORONAVIRUS 2 (TAT 6-24 HRS): SARS Coronavirus 2: NEGATIVE

## 2020-11-14 MED ORDER — BUPIVACAINE LIPOSOME 1.3 % IJ SUSP
20.0000 mL | Freq: Once | INTRAMUSCULAR | Status: DC
  Filled 2020-11-14: qty 20

## 2020-11-14 NOTE — Anesthesia Preprocedure Evaluation (Addendum)
Anesthesia Evaluation  Patient identified by MRN, date of birth, ID band Patient awake    Reviewed: Allergy & Precautions, NPO status , Patient's Chart, lab work & pertinent test results  History of Anesthesia Complications Negative for: history of anesthetic complications  Airway Mallampati: II  TM Distance: >3 FB Neck ROM: Full    Dental no notable dental hx. (+) Dental Advisory Given   Pulmonary sleep apnea , former smoker,    Pulmonary exam normal        Cardiovascular hypertension, Pt. on medications Normal cardiovascular exam     Neuro/Psych negative neurological ROS     GI/Hepatic Neg liver ROS, hiatal hernia, GERD  Medicated and Controlled,  Endo/Other  Morbid obesity  Renal/GU Renal disease     Musculoskeletal  (+) Arthritis ,   Abdominal   Peds  Hematology negative hematology ROS (+)   Anesthesia Other Findings   Reproductive/Obstetrics                            Anesthesia Physical  Anesthesia Plan  ASA: II  Anesthesia Plan: MAC, Spinal and Regional   Post-op Pain Management:    Induction:   PONV Risk Score and Plan: 1 and Treatment may vary due to age or medical condition and Propofol infusion  Airway Management Planned: Natural Airway  Additional Equipment:   Intra-op Plan:   Post-operative Plan:   Informed Consent: I have reviewed the patients History and Physical, chart, labs and discussed the procedure including the risks, benefits and alternatives for the proposed anesthesia with the patient or authorized representative who has indicated his/her understanding and acceptance.     Dental advisory given  Plan Discussed with: CRNA and Anesthesiologist  Anesthesia Plan Comments:        Anesthesia Quick Evaluation

## 2020-11-15 ENCOUNTER — Encounter (HOSPITAL_COMMUNITY)
Admission: RE | Disposition: A | Discharge status: Home / Self Care | Payer: Self-pay | Source: Home / Self Care | Attending: Orthopedic Surgery

## 2020-11-15 ENCOUNTER — Other Ambulatory Visit: Payer: Self-pay

## 2020-11-15 ENCOUNTER — Inpatient Hospital Stay (HOSPITAL_COMMUNITY)
Admission: RE | Admit: 2020-11-15 | Discharge: 2020-11-17 | DRG: 470 | Disposition: A | Discharge status: Home / Self Care | Payer: Medicare Other | Attending: Orthopedic Surgery | Admitting: Orthopedic Surgery

## 2020-11-15 ENCOUNTER — Ambulatory Visit (HOSPITAL_COMMUNITY): Payer: Medicare Other | Admitting: Anesthesiology

## 2020-11-15 ENCOUNTER — Encounter (HOSPITAL_COMMUNITY): Payer: Self-pay | Admitting: Orthopedic Surgery

## 2020-11-15 DIAGNOSIS — Z923 Personal history of irradiation: Secondary | ICD-10-CM

## 2020-11-15 DIAGNOSIS — G4733 Obstructive sleep apnea (adult) (pediatric): Secondary | ICD-10-CM | POA: Diagnosis present

## 2020-11-15 DIAGNOSIS — K219 Gastro-esophageal reflux disease without esophagitis: Secondary | ICD-10-CM | POA: Diagnosis present

## 2020-11-15 DIAGNOSIS — H3530 Unspecified macular degeneration: Secondary | ICD-10-CM | POA: Diagnosis present

## 2020-11-15 DIAGNOSIS — M1712 Unilateral primary osteoarthritis, left knee: Secondary | ICD-10-CM | POA: Diagnosis present

## 2020-11-15 DIAGNOSIS — Z9989 Dependence on other enabling machines and devices: Secondary | ICD-10-CM | POA: Diagnosis not present

## 2020-11-15 DIAGNOSIS — Z96651 Presence of right artificial knee joint: Secondary | ICD-10-CM | POA: Diagnosis present

## 2020-11-15 DIAGNOSIS — Z8572 Personal history of non-Hodgkin lymphomas: Secondary | ICD-10-CM | POA: Diagnosis not present

## 2020-11-15 DIAGNOSIS — Z96652 Presence of left artificial knee joint: Secondary | ICD-10-CM

## 2020-11-15 DIAGNOSIS — Z6833 Body mass index (BMI) 33.0-33.9, adult: Secondary | ICD-10-CM | POA: Diagnosis not present

## 2020-11-15 DIAGNOSIS — E785 Hyperlipidemia, unspecified: Secondary | ICD-10-CM | POA: Diagnosis present

## 2020-11-15 DIAGNOSIS — Z87891 Personal history of nicotine dependence: Secondary | ICD-10-CM | POA: Diagnosis not present

## 2020-11-15 DIAGNOSIS — G8918 Other acute postprocedural pain: Secondary | ICD-10-CM | POA: Diagnosis not present

## 2020-11-15 DIAGNOSIS — Z20822 Contact with and (suspected) exposure to covid-19: Secondary | ICD-10-CM | POA: Diagnosis present

## 2020-11-15 DIAGNOSIS — I10 Essential (primary) hypertension: Secondary | ICD-10-CM | POA: Diagnosis present

## 2020-11-15 HISTORY — PX: TOTAL KNEE ARTHROPLASTY: SHX125

## 2020-11-15 SURGERY — ARTHROPLASTY, KNEE, TOTAL
Anesthesia: Monitor Anesthesia Care | Site: Knee | Laterality: Left

## 2020-11-15 MED ORDER — PROPOFOL 1000 MG/100ML IV EMUL
INTRAVENOUS | Status: AC
  Filled 2020-11-15: qty 100

## 2020-11-15 MED ORDER — ONDANSETRON HCL 4 MG/2ML IJ SOLN
INTRAMUSCULAR | Status: DC | PRN
  Administered 2020-11-15: 4 mg via INTRAVENOUS

## 2020-11-15 MED ORDER — ACETAMINOPHEN 500 MG PO TABS
1000.0000 mg | ORAL_TABLET | Freq: Once | ORAL | Status: AC
  Administered 2020-11-15: 1000 mg via ORAL
  Filled 2020-11-15: qty 2

## 2020-11-15 MED ORDER — ONDANSETRON HCL 4 MG PO TABS: 4.0000 mg | ORAL_TABLET | Freq: Three times a day (TID) | ORAL | 0 refills | Status: DC | PRN

## 2020-11-15 MED ORDER — TRANEXAMIC ACID-NACL 1000-0.7 MG/100ML-% IV SOLN
1000.0000 mg | INTRAVENOUS | Status: AC
  Administered 2020-11-15: 1000 mg via INTRAVENOUS
  Filled 2020-11-15: qty 100

## 2020-11-15 MED ORDER — METHOCARBAMOL 500 MG PO TABS
500.0000 mg | ORAL_TABLET | Freq: Four times a day (QID) | ORAL | Status: DC | PRN
  Administered 2020-11-15 – 2020-11-17 (×6): 500 mg via ORAL
  Filled 2020-11-15 (×6): qty 1

## 2020-11-15 MED ORDER — OXYCODONE HCL 5 MG PO TABS
10.0000 mg | ORAL_TABLET | ORAL | Status: DC | PRN
  Administered 2020-11-15 (×2): 15 mg via ORAL
  Administered 2020-11-15: 10 mg via ORAL
  Administered 2020-11-16 (×3): 15 mg via ORAL
  Administered 2020-11-17: 10 mg via ORAL
  Filled 2020-11-15 (×3): qty 3
  Filled 2020-11-15: qty 2
  Filled 2020-11-15 (×2): qty 3

## 2020-11-15 MED ORDER — PANTOPRAZOLE SODIUM 40 MG PO TBEC
40.0000 mg | DELAYED_RELEASE_TABLET | Freq: Every day | ORAL | Status: DC
  Administered 2020-11-15 – 2020-11-17 (×3): 40 mg via ORAL
  Filled 2020-11-15 (×4): qty 1

## 2020-11-15 MED ORDER — ONDANSETRON HCL 4 MG PO TABS: 4.0000 mg | ORAL_TABLET | Freq: Four times a day (QID) | ORAL | Status: DC | PRN

## 2020-11-15 MED ORDER — ADULT MULTIVITAMIN W/MINERALS CH
2.0000 | ORAL_TABLET | Freq: Every day | ORAL | Status: DC
  Administered 2020-11-16 – 2020-11-17 (×2): 2 via ORAL
  Filled 2020-11-15 (×2): qty 2

## 2020-11-15 MED ORDER — POLYVINYL ALCOHOL 1.4 % OP SOLN
1.0000 [drp] | Freq: Three times a day (TID) | OPHTHALMIC | Status: DC
  Administered 2020-11-16 – 2020-11-17 (×4): 1 [drp] via OPHTHALMIC
  Filled 2020-11-15: qty 15

## 2020-11-15 MED ORDER — METHOCARBAMOL 500 MG PO TABS: 500.0000 mg | ORAL_TABLET | Freq: Four times a day (QID) | ORAL | 1 refills | Status: DC | PRN

## 2020-11-15 MED ORDER — CELECOXIB 200 MG PO CAPS
200.0000 mg | ORAL_CAPSULE | Freq: Once | ORAL | Status: AC
  Administered 2020-11-15: 200 mg via ORAL
  Filled 2020-11-15: qty 1

## 2020-11-15 MED ORDER — HYDROCHLOROTHIAZIDE 25 MG PO TABS
12.5000 mg | ORAL_TABLET | Freq: Every day | ORAL | Status: DC
  Administered 2020-11-16 – 2020-11-17 (×2): 12.5 mg via ORAL
  Filled 2020-11-15 (×2): qty 1

## 2020-11-15 MED ORDER — POLYETHYL GLYCOL-PROPYL GLYCOL 0.4-0.3 % OP GEL: Freq: Three times a day (TID) | OPHTHALMIC | Status: DC

## 2020-11-15 MED ORDER — NAPROXEN SODIUM 220 MG PO TABS: 440.0000 mg | ORAL_TABLET | Freq: Every day | ORAL | Status: DC | PRN

## 2020-11-15 MED ORDER — ACETAMINOPHEN 325 MG PO TABS
325.0000 mg | ORAL_TABLET | Freq: Four times a day (QID) | ORAL | Status: DC | PRN
Start: 2020-11-16 — End: 2020-11-17
  Administered 2020-11-16 – 2020-11-17 (×2): 650 mg via ORAL
  Filled 2020-11-15: qty 2

## 2020-11-15 MED ORDER — ONDANSETRON HCL 4 MG/2ML IJ SOLN
INTRAMUSCULAR | Status: AC
  Filled 2020-11-15: qty 2

## 2020-11-15 MED ORDER — CEFAZOLIN SODIUM-DEXTROSE 2-4 GM/100ML-% IV SOLN
2.0000 g | Freq: Four times a day (QID) | INTRAVENOUS | Status: AC
  Administered 2020-11-15 (×2): 2 g via INTRAVENOUS
  Filled 2020-11-15 (×2): qty 100

## 2020-11-15 MED ORDER — CHOLECALCIFEROL 10 MCG (400 UNIT) PO TABS
400.0000 [IU] | ORAL_TABLET | Freq: Two times a day (BID) | ORAL | Status: DC
  Administered 2020-11-16 – 2020-11-17 (×3): 400 [IU] via ORAL
  Filled 2020-11-15 (×4): qty 1

## 2020-11-15 MED ORDER — METHOCARBAMOL 1000 MG/10ML IJ SOLN
500.0000 mg | Freq: Four times a day (QID) | INTRAVENOUS | Status: DC | PRN
  Filled 2020-11-15: qty 5

## 2020-11-15 MED ORDER — MIDAZOLAM HCL 5 MG/5ML IJ SOLN
INTRAMUSCULAR | Status: DC | PRN
  Administered 2020-11-15 (×2): 1 mg via INTRAVENOUS

## 2020-11-15 MED ORDER — FENTANYL CITRATE (PF) 100 MCG/2ML IJ SOLN
INTRAMUSCULAR | Status: DC | PRN
  Administered 2020-11-15 (×2): 50 ug via INTRAVENOUS

## 2020-11-15 MED ORDER — PHENYLEPHRINE 40 MCG/ML (10ML) SYRINGE FOR IV PUSH (FOR BLOOD PRESSURE SUPPORT)
PREFILLED_SYRINGE | INTRAVENOUS | Status: DC | PRN
  Administered 2020-11-15: 80 ug via INTRAVENOUS

## 2020-11-15 MED ORDER — AMISULPRIDE (ANTIEMETIC) 5 MG/2ML IV SOLN: 10.0000 mg | Freq: Once | INTRAVENOUS | Status: DC | PRN

## 2020-11-15 MED ORDER — METOCLOPRAMIDE HCL 5 MG/ML IJ SOLN
5.0000 mg | Freq: Three times a day (TID) | INTRAMUSCULAR | Status: DC | PRN
  Administered 2020-11-15: 10 mg via INTRAVENOUS
  Filled 2020-11-15: qty 2

## 2020-11-15 MED ORDER — LACTATED RINGERS IV SOLN: INTRAVENOUS | Status: DC

## 2020-11-15 MED ORDER — ROSUVASTATIN CALCIUM 5 MG PO TABS
5.0000 mg | ORAL_TABLET | Freq: Every day | ORAL | Status: DC
  Administered 2020-11-16 – 2020-11-17 (×2): 5 mg via ORAL
  Filled 2020-11-15 (×2): qty 1

## 2020-11-15 MED ORDER — MENTHOL 3 MG MT LOZG: 1.0000 | LOZENGE | OROMUCOSAL | Status: DC | PRN

## 2020-11-15 MED ORDER — TRANEXAMIC ACID-NACL 1000-0.7 MG/100ML-% IV SOLN
1000.0000 mg | Freq: Once | INTRAVENOUS | Status: AC
  Administered 2020-11-15: 1000 mg via INTRAVENOUS
  Filled 2020-11-15: qty 100

## 2020-11-15 MED ORDER — MIDAZOLAM HCL 2 MG/2ML IJ SOLN
INTRAMUSCULAR | Status: AC
  Filled 2020-11-15: qty 2

## 2020-11-15 MED ORDER — CHLORHEXIDINE GLUCONATE 0.12 % MT SOLN
15.0000 mL | Freq: Once | OROMUCOSAL | Status: AC
  Administered 2020-11-15: 15 mL via OROMUCOSAL

## 2020-11-15 MED ORDER — PROPOFOL 500 MG/50ML IV EMUL
INTRAVENOUS | Status: DC | PRN
  Administered 2020-11-15: 100 ug/kg/min via INTRAVENOUS

## 2020-11-15 MED ORDER — ORAL CARE MOUTH RINSE: 15.0000 mL | Freq: Once | OROMUCOSAL | Status: AC

## 2020-11-15 MED ORDER — LOSARTAN POTASSIUM 50 MG PO TABS
100.0000 mg | ORAL_TABLET | Freq: Every day | ORAL | Status: DC
  Administered 2020-11-16 – 2020-11-17 (×2): 100 mg via ORAL
  Filled 2020-11-15 (×2): qty 2

## 2020-11-15 MED ORDER — PROPOFOL 10 MG/ML IV BOLUS
INTRAVENOUS | Status: DC | PRN
  Administered 2020-11-15: 40 mg via INTRAVENOUS

## 2020-11-15 MED ORDER — PROPOFOL 10 MG/ML IV BOLUS
INTRAVENOUS | Status: AC
  Filled 2020-11-15: qty 20

## 2020-11-15 MED ORDER — EPHEDRINE SULFATE-NACL 50-0.9 MG/10ML-% IV SOSY
PREFILLED_SYRINGE | INTRAVENOUS | Status: DC | PRN
  Administered 2020-11-15: 10 mg via INTRAVENOUS

## 2020-11-15 MED ORDER — BLUE-EMU SUPER STRENGTH EX CREA: 1.0000 "application " | TOPICAL_CREAM | Freq: Every day | CUTANEOUS | Status: DC | PRN

## 2020-11-15 MED ORDER — PHENOL 1.4 % MT LIQD: 1.0000 | OROMUCOSAL | Status: DC | PRN

## 2020-11-15 MED ORDER — WATER FOR IRRIGATION, STERILE IR SOLN
Status: DC | PRN
  Administered 2020-11-15: 2000 mL

## 2020-11-15 MED ORDER — DOCUSATE SODIUM 100 MG PO CAPS
100.0000 mg | ORAL_CAPSULE | Freq: Two times a day (BID) | ORAL | Status: DC
  Administered 2020-11-15 – 2020-11-17 (×4): 100 mg via ORAL
  Filled 2020-11-15 (×4): qty 1

## 2020-11-15 MED ORDER — SODIUM CHLORIDE (PF) 0.9 % IJ SOLN
INTRAMUSCULAR | Status: AC
  Filled 2020-11-15: qty 30

## 2020-11-15 MED ORDER — POLYETHYLENE GLYCOL 3350 17 G PO PACK: 17.0000 g | PACK | Freq: Every day | ORAL | Status: DC | PRN

## 2020-11-15 MED ORDER — ONDANSETRON HCL 4 MG/2ML IJ SOLN
4.0000 mg | Freq: Four times a day (QID) | INTRAMUSCULAR | Status: DC | PRN
  Administered 2020-11-15: 4 mg via INTRAVENOUS
  Filled 2020-11-15: qty 2

## 2020-11-15 MED ORDER — SODIUM CHLORIDE 0.9 % IV SOLN: INTRAVENOUS | Status: DC

## 2020-11-15 MED ORDER — SODIUM CHLORIDE (PF) 0.9 % IJ SOLN
INTRAMUSCULAR | Status: DC | PRN
  Administered 2020-11-15: 30 mL via INTRAVENOUS

## 2020-11-15 MED ORDER — HYDROMORPHONE HCL 1 MG/ML IJ SOLN
0.5000 mg | INTRAMUSCULAR | Status: DC | PRN
Start: 2020-11-15 — End: 2020-11-17
  Administered 2020-11-15: 0.5 mg via INTRAVENOUS
  Administered 2020-11-15 (×2): 1 mg via INTRAVENOUS
  Filled 2020-11-15 (×3): qty 1

## 2020-11-15 MED ORDER — NAPROXEN 250 MG PO TABS
500.0000 mg | ORAL_TABLET | Freq: Every day | ORAL | Status: DC | PRN
  Filled 2020-11-15: qty 2

## 2020-11-15 MED ORDER — BUPIVACAINE-EPINEPHRINE 0.25% -1:200000 IJ SOLN
INTRAMUSCULAR | Status: DC | PRN
  Administered 2020-11-15: 30 mL

## 2020-11-15 MED ORDER — BISACODYL 10 MG RE SUPP
10.0000 mg | Freq: Every day | RECTAL | Status: DC | PRN
Start: 2020-11-15 — End: 2020-11-17

## 2020-11-15 MED ORDER — PRESERVISION AREDS 2 PO CAPS: 2.0000 | ORAL_CAPSULE | Freq: Every day | ORAL | Status: DC

## 2020-11-15 MED ORDER — BUPIVACAINE LIPOSOME 1.3 % IJ SUSP
INTRAMUSCULAR | Status: DC | PRN
  Administered 2020-11-15: 20 mL

## 2020-11-15 MED ORDER — OXYCODONE-ACETAMINOPHEN 5-325 MG PO TABS: 1.0000 | ORAL_TABLET | ORAL | 0 refills | Status: DC | PRN

## 2020-11-15 MED ORDER — PROSIGHT PO TABS
2.0000 | ORAL_TABLET | Freq: Every day | ORAL | Status: DC
  Administered 2020-11-15 – 2020-11-16 (×2): 2 via ORAL
  Filled 2020-11-15 (×2): qty 2

## 2020-11-15 MED ORDER — SODIUM CHLORIDE 0.9 % IR SOLN
Status: DC | PRN
  Administered 2020-11-15: 1000 mL

## 2020-11-15 MED ORDER — ROPIVACAINE HCL 7.5 MG/ML IJ SOLN
INTRAMUSCULAR | Status: DC | PRN
  Administered 2020-11-15: 20 mL via PERINEURAL

## 2020-11-15 MED ORDER — OMEGA-3-ACID ETHYL ESTERS 1 G PO CAPS
1.0000 g | ORAL_CAPSULE | Freq: Two times a day (BID) | ORAL | Status: DC
  Administered 2020-11-15 – 2020-11-17 (×4): 1 g via ORAL
  Filled 2020-11-15 (×4): qty 1

## 2020-11-15 MED ORDER — 0.9 % SODIUM CHLORIDE (POUR BTL) OPTIME
TOPICAL | Status: DC | PRN
  Administered 2020-11-15: 1000 mL

## 2020-11-15 MED ORDER — PHENYLEPHRINE HCL-NACL 10-0.9 MG/250ML-% IV SOLN
INTRAVENOUS | Status: DC | PRN
  Administered 2020-11-15: 35 ug/min via INTRAVENOUS

## 2020-11-15 MED ORDER — CEFAZOLIN SODIUM-DEXTROSE 2-4 GM/100ML-% IV SOLN
2.0000 g | INTRAVENOUS | Status: AC
  Administered 2020-11-15: 2 g via INTRAVENOUS
  Filled 2020-11-15: qty 100

## 2020-11-15 MED ORDER — FENTANYL CITRATE (PF) 100 MCG/2ML IJ SOLN: 25.0000 ug | INTRAMUSCULAR | Status: DC | PRN

## 2020-11-15 MED ORDER — ASPIRIN 81 MG PO CHEW: 81.0000 mg | CHEWABLE_TABLET | Freq: Two times a day (BID) | ORAL | 0 refills | Status: DC

## 2020-11-15 MED ORDER — METOCLOPRAMIDE HCL 5 MG PO TABS: 5.0000 mg | ORAL_TABLET | Freq: Three times a day (TID) | ORAL | Status: DC | PRN

## 2020-11-15 MED ORDER — BUPIVACAINE-EPINEPHRINE (PF) 0.25% -1:200000 IJ SOLN
INTRAMUSCULAR | Status: AC
  Filled 2020-11-15: qty 30

## 2020-11-15 MED ORDER — POVIDONE-IODINE 10 % EX SWAB
2.0000 "application " | Freq: Once | CUTANEOUS | Status: AC
  Administered 2020-11-15: 2 via TOPICAL

## 2020-11-15 MED ORDER — OMEGA-3 + VITAMIN D3 1110-300 MG-UNIT PO CAPS: ORAL_CAPSULE | Freq: Two times a day (BID) | ORAL | Status: DC

## 2020-11-15 MED ORDER — FENTANYL CITRATE (PF) 100 MCG/2ML IJ SOLN
INTRAMUSCULAR | Status: AC
  Filled 2020-11-15: qty 2

## 2020-11-15 MED ORDER — ASPIRIN 81 MG PO CHEW
81.0000 mg | CHEWABLE_TABLET | Freq: Two times a day (BID) | ORAL | Status: DC
  Administered 2020-11-15 – 2020-11-17 (×4): 81 mg via ORAL
  Filled 2020-11-15 (×4): qty 1

## 2020-11-15 MED ORDER — BUPIVACAINE IN DEXTROSE 0.75-8.25 % IT SOLN
INTRATHECAL | Status: DC | PRN
  Administered 2020-11-15: 2 mL via INTRATHECAL

## 2020-11-15 MED ORDER — OXYCODONE HCL 5 MG PO TABS
5.0000 mg | ORAL_TABLET | ORAL | Status: DC | PRN
  Administered 2020-11-16 – 2020-11-17 (×4): 10 mg via ORAL
  Administered 2020-11-17: 5 mg via ORAL
  Filled 2020-11-15 (×3): qty 2
  Filled 2020-11-15: qty 1
  Filled 2020-11-15 (×2): qty 2

## 2020-11-15 SURGICAL SUPPLY — 53 items
ATTUNE MED DOME PAT 41 KNEE (Knees) ×2 IMPLANT
ATTUNE PS FEM LT SZ 6 CEM KNEE (Femur) ×2 IMPLANT
ATTUNE PSRP INSR SZ6 12 KNEE (Insert) ×2 IMPLANT
BAG ZIPLOCK 12X15 (MISCELLANEOUS) IMPLANT
BASE TIBIAL ROT PLAT SZ 7 KNEE (Knees) ×1 IMPLANT
BLADE SAG 18X100X1.27 (BLADE) ×2 IMPLANT
BLADE SAW SGTL 13X75X1.27 (BLADE) ×2 IMPLANT
BNDG ELASTIC 6X10 VLCR STRL LF (GAUZE/BANDAGES/DRESSINGS) ×2 IMPLANT
BNDG GAUZE ELAST 4 BULKY (GAUZE/BANDAGES/DRESSINGS) ×2 IMPLANT
BOWL SMART MIX CTS (DISPOSABLE) ×2 IMPLANT
CEMENT HV SMART SET (Cement) ×4 IMPLANT
COVER SURGICAL LIGHT HANDLE (MISCELLANEOUS) ×2 IMPLANT
COVER WAND RF STERILE (DRAPES) IMPLANT
CUFF TOURN SGL QUICK 34 (TOURNIQUET CUFF) ×1
CUFF TRNQT CYL 34X4.125X (TOURNIQUET CUFF) ×1 IMPLANT
DRAPE SHEET LG 3/4 BI-LAMINATE (DRAPES) ×2 IMPLANT
DRAPE U-SHAPE 47X51 STRL (DRAPES) ×2 IMPLANT
DRSG ADAPTIC 3X8 NADH LF (GAUZE/BANDAGES/DRESSINGS) ×2 IMPLANT
DRSG PAD ABDOMINAL 8X10 ST (GAUZE/BANDAGES/DRESSINGS) ×2 IMPLANT
DURAPREP 26ML APPLICATOR (WOUND CARE) ×2 IMPLANT
ELECT REM PT RETURN 15FT ADLT (MISCELLANEOUS) ×2 IMPLANT
GAUZE SPONGE 4X4 12PLY STRL (GAUZE/BANDAGES/DRESSINGS) ×2 IMPLANT
GLOVE BIOGEL PI ORTHO PRO 7.5 (GLOVE) ×1
GLOVE BIOGEL PI ORTHO PRO SZ8 (GLOVE) ×1
GLOVE ORTHO TXT STRL SZ7.5 (GLOVE) ×2 IMPLANT
GLOVE PI ORTHO PRO STRL 7.5 (GLOVE) ×1 IMPLANT
GLOVE PI ORTHO PRO STRL SZ8 (GLOVE) ×1 IMPLANT
GLOVE SURG ORTHO LTX SZ8.5 (GLOVE) ×4 IMPLANT
GOWN STRL REUS W/TWL XL LVL3 (GOWN DISPOSABLE) ×4 IMPLANT
HANDPIECE INTERPULSE COAX TIP (DISPOSABLE) ×1
HOLDER FOLEY CATH W/STRAP (MISCELLANEOUS) IMPLANT
IMMOBILIZER KNEE 20 (SOFTGOODS) ×2
IMMOBILIZER KNEE 20 THIGH 36 (SOFTGOODS) ×1 IMPLANT
KIT TURNOVER KIT A (KITS) ×2 IMPLANT
MANIFOLD NEPTUNE II (INSTRUMENTS) ×2 IMPLANT
NS IRRIG 1000ML POUR BTL (IV SOLUTION) ×2 IMPLANT
PACK TOTAL KNEE CUSTOM (KITS) ×2 IMPLANT
PENCIL SMOKE EVACUATOR (MISCELLANEOUS) IMPLANT
PIN DRILL FIX HALF THREAD (BIT) ×2 IMPLANT
PIN STEINMAN FIXATION KNEE (PIN) ×2 IMPLANT
PROTECTOR NERVE ULNAR (MISCELLANEOUS) ×2 IMPLANT
SET HNDPC FAN SPRY TIP SCT (DISPOSABLE) ×1 IMPLANT
STAPLER VISISTAT 35W (STAPLE) IMPLANT
STRIP CLOSURE SKIN 1/2X4 (GAUZE/BANDAGES/DRESSINGS) ×4 IMPLANT
SUT MNCRL AB 3-0 PS2 18 (SUTURE) ×2 IMPLANT
SUT VIC AB 0 CT1 36 (SUTURE) ×2 IMPLANT
SUT VIC AB 1 CT1 36 (SUTURE) ×6 IMPLANT
SUT VIC AB 2-0 CT1 27 (SUTURE) ×1
SUT VIC AB 2-0 CT1 TAPERPNT 27 (SUTURE) ×1 IMPLANT
TIBIAL BASE ROT PLAT SZ 7 KNEE (Knees) ×2 IMPLANT
TRAY FOLEY MTR SLVR 16FR STAT (SET/KITS/TRAYS/PACK) ×2 IMPLANT
WATER STERILE IRR 1000ML POUR (IV SOLUTION) ×4 IMPLANT
WRAP KNEE MAXI GEL POST OP (GAUZE/BANDAGES/DRESSINGS) ×2 IMPLANT

## 2020-11-15 NOTE — Progress Notes (Signed)
Patient presents for left total knee arthroplasty today. No changes in clinical status.   Plan for TKR left  Patient agrees with the plan.   Dr Netta Cedars

## 2020-11-15 NOTE — Anesthesia Procedure Notes (Signed)
Anesthesia Regional Block: Adductor canal block   Pre-Anesthetic Checklist: ,, timeout performed, Correct Patient, Correct Site, Correct Laterality, Correct Procedure, Correct Position, site marked, Risks and benefits discussed,  Surgical consent,  Pre-op evaluation,  At surgeon's request and post-op pain management  Laterality: Left  Prep: chloraprep       Needles:  Injection technique: Single-shot  Needle Type: Stimulator Needle - 80     Needle Length: 10cm  Needle Gauge: 21     Additional Needles:   Narrative:  Start time: 11/15/2020 6:53 AM End time: 11/15/2020 7:03 AM Injection made incrementally with aspirations every 5 mL.  Performed by: Personally

## 2020-11-15 NOTE — Progress Notes (Signed)
Orthopedic Tech Progress Note Patient Details:  Michael Huffman May 11, 1951 [id number]  CPM Left Knee CPM Left Knee: On Left Knee Flexion (Degrees): 60 Left Knee Extension (Degrees): 0  Post Interventions Patient Tolerated: Well Instructions Provided: Care of device Ortho Devices Type of Ortho Device: Bone foam zero knee Ortho Device/Splint Interventions: Ordered,Application,Adjustment   Post Interventions Patient Tolerated: Well Instructions Provided: Care of device   [name] 11/15/2020, 11:20 AM

## 2020-11-15 NOTE — Transfer of Care (Signed)
Immediate Anesthesia Transfer of Care Note  Patient: Michael Huffman  Procedure(s) Performed: TOTAL KNEE ARTHROPLASTY (Left Knee)  Patient Location: PACU  Anesthesia Type:Spinal  Level of Consciousness: awake, alert  and oriented  Airway & Oxygen Therapy: Patient Spontanous Breathing and Patient connected to face mask oxygen  Post-op Assessment: Report given to RN and Post -op Vital signs reviewed and stable  Post vital signs: Reviewed and stable  Last Vitals:  Vitals Value Taken Time  BP 75/64 11/15/20 0930  Temp    Pulse 78 11/15/20 0931  Resp 19 11/15/20 0931  SpO2 100 % 11/15/20 0931  Vitals shown include unvalidated device data.  Last Pain:  Vitals:   11/15/20 0552  TempSrc: Oral         Complications: No complications documented.

## 2020-11-15 NOTE — Plan of Care (Signed)

## 2020-11-15 NOTE — Anesthesia Procedure Notes (Signed)
Procedure Name: MAC Date/Time: 11/15/2020 7:30 AM Performed by: Maxwell Caul, CRNA Pre-anesthesia Checklist: Patient identified, Emergency Drugs available, Suction available and Patient being monitored Oxygen Delivery Method: Simple face mask

## 2020-11-15 NOTE — Evaluation (Signed)
Physical Therapy Evaluation Patient Details Name: Michael Huffman MRN: [id number] DOB: December 16, 1950 Today's Date: 11/15/2020   History of Present Illness  Pt s/p L TKR and with hx of macular degeneration, and R TKR  Clinical Impression  Pt s/p L TKR and presents with decreased L LE strength/ROM and post op pain limiting functional mobility.  Pt should progress to dc home with family assist and reports first OP PT scheduled for Wednesday, 11/20/20.    Follow Up Recommendations Follow surgeon's recommendation for DC plan and follow-up therapies    Equipment Recommendations  None recommended by PT    Recommendations for Other Services       Precautions / Restrictions Precautions Precautions: Knee;Fall Required Braces or Orthoses: Knee Immobilizer - Left Knee Immobilizer - Left: Discontinue once straight leg raise with < 10 degree lag Restrictions Weight Bearing Restrictions: No LLE Weight Bearing: Weight bearing as tolerated      Mobility  Bed Mobility Overal bed mobility: Needs Assistance Bed Mobility: Supine to Sit     Supine to sit: Min assist     General bed mobility comments: cues for use of R LE to self assist.  Min assist to manage R LE    Transfers Overall transfer level: Needs assistance Equipment used: Rolling walker (2 wheeled) Transfers: Sit to/from Stand Sit to Stand: Min guard         General transfer comment: cues for LE management and use of UEs to self assist  Ambulation/Gait Ambulation/Gait assistance: Min assist;Min guard Gait Distance (Feet): 27 Feet Assistive device: Rolling walker (2 wheeled) Gait Pattern/deviations: Step-to pattern;Decreased step length - right;Decreased step length - left;Shuffle;Trunk flexed Gait velocity: decr   General Gait Details: cues for sequence, posture and position from [employer]            Wheelchair Mobility    Modified Rankin (Stroke Patients Only)       Balance Overall balance assessment:  Needs assistance Sitting-balance support: No upper extremity supported;Feet supported Sitting balance-Leahy Scale: Good     Standing balance support: Bilateral upper extremity supported Standing balance-Leahy Scale: Poor                               Pertinent Vitals/Pain Pain Assessment: 0-10 Pain Score: 4  Pain Location: L knee Pain Descriptors / Indicators: Aching;Sore Pain Intervention(s): Limited activity within patient's tolerance;Monitored during session;Premedicated before session;Ice applied    Home Living Family/patient expects to be discharged to:: Private residence Living Arrangements: Spouse/significant other Available Help at Discharge: Family Type of Home: House Home Access: Stairs to enter Entrance Stairs-Rails: Right Entrance Stairs-Number of Steps: 5 Home Layout: Two level;Able to live on main level with bedroom/bathroom Home Equipment: Gilford Rile - 2 wheels;Cane - single point      Prior Function Level of Independence: Independent               Hand Dominance        Extremity/Trunk Assessment   Upper Extremity Assessment Upper Extremity Assessment: Overall WFL for tasks assessed    Lower Extremity Assessment Lower Extremity Assessment: RLE deficits/detail    Cervical / Trunk Assessment Cervical / Trunk Assessment: Normal  Communication   Communication: No difficulties  Cognition Arousal/Alertness: Awake/alert Behavior During Therapy: WFL for tasks assessed/performed Overall Cognitive Status: Within Functional Limits for tasks assessed  General Comments      Exercises Total Joint Exercises Ankle Circles/Pumps: AROM;Both;15 reps;Supine   Assessment/Plan    PT Assessment Patient needs continued PT services  PT Problem List Decreased strength;Decreased range of motion;Decreased activity tolerance;Decreased balance;Decreased mobility;Decreased knowledge of use of  DME;Pain       PT Treatment Interventions DME instruction;Gait training;Stair training;Functional mobility training;Therapeutic exercise;Therapeutic activities;Patient/family education    PT Goals (Current goals can be found in the Care Plan section)  Acute Rehab PT Goals Patient Stated Goal: Regain IND PT Goal Formulation: With patient Time For Goal Achievement: 11/22/20 Potential to Achieve Goals: Good    Frequency 7X/week   Barriers to discharge        Co-evaluation               AM-PAC PT "6 Clicks" Mobility  Outcome Measure Help needed turning from your back to your side while in a flat bed without using bedrails?: A Little Help needed moving from lying on your back to sitting on the side of a flat bed without using bedrails?: A Little Help needed moving to and from a bed to a chair (including a wheelchair)?: A Little Help needed standing up from a chair using your arms (e.g., wheelchair or bedside chair)?: A Little Help needed to walk in hospital room?: A Little Help needed climbing 3-5 steps with a railing? : A Lot 6 Click Score: 17    End of Session Equipment Utilized During Treatment: Gait belt;Left knee immobilizer Activity Tolerance: Patient tolerated treatment well;Patient limited by fatigue (mild dizziness) Patient left: in chair;with call bell/phone within reach;with chair alarm set Nurse Communication: Mobility status PT Visit Diagnosis: Unsteadiness on feet (R26.81);Difficulty in walking, not elsewhere classified (R26.2)    Time: 0102-7253 PT Time Calculation (min) (ACUTE ONLY): 28 min   Charges:   PT Evaluation $PT Eval Low Complexity: 1 Low PT Treatments $Gait Training: 8-22 mins        Debe Coder PT Acute Rehabilitation Services Pager [phone] Office [phone]   Dejanay Wamboldt 11/15/2020, 5:26 PM

## 2020-11-15 NOTE — Brief Op Note (Signed)
11/15/2020  9:12 AM  PATIENT:  Michael Huffman  70 y.o. male  PRE-OPERATIVE DIAGNOSIS:  Left knee osteoarthritis, end stage  POST-OPERATIVE DIAGNOSIS:  Left knee osteoarthritis, end stage  PROCEDURE:  Procedure(s): TOTAL KNEE ARTHROPLASTY (Left) DePuy Attune  SURGEON:  Surgeon(s) and Role:    Netta Cedars, MD - Primary  PHYSICIAN ASSISTANT:   ASSISTANTS: Ventura Bruns, PA-C   ANESTHESIA:   regional and spinal  EBL:  minimal  BLOOD ADMINISTERED:none  DRAINS: none   LOCAL MEDICATIONS USED:  MARCAINE     SPECIMEN:  No Specimen  DISPOSITION OF SPECIMEN:  N/A  COUNTS:  YES  TOURNIQUET:  1 hour  DICTATION: .Other Dictation: Dictation Number [id number]  PLAN OF CARE: Admit to inpatient   PATIENT DISPOSITION:  PACU - hemodynamically stable.   Delay start of Pharmacological VTE agent (>24hrs) due to surgical blood loss or risk of bleeding: no

## 2020-11-15 NOTE — Anesthesia Procedure Notes (Signed)
Spinal  Patient location during procedure: OR End time: 11/15/2020 7:36 AM Staffing Performed: resident/CRNA  Anesthesiologist: Duane Boston, MD Resident/CRNA: Maxwell Caul, CRNA Preanesthetic Checklist Completed: patient identified, IV checked, site marked, risks and benefits discussed, surgical consent, monitors and equipment checked, pre-op evaluation and timeout performed Spinal Block Patient position: sitting Prep: DuraPrep Patient monitoring: heart rate, cardiac monitor, continuous pulse ox and blood pressure Approach: midline Location: L3-4 Injection technique: single-shot Needle Needle type: Pencan  Needle gauge: 24 G Needle length: 10 cm Assessment Sensory level: T4 Additional Notes IV functioning, monitors applied to pt. Expiration date of kit checked and confirmed to be in date. Sterile prep and drape, hand hygiene and sterile gloved used. Pt was positioned and spine was prepped in sterile fashion. Skin was anesthetized with lidocaine. Free flow of clear CSF obtained prior to injecting local anesthetic into CSF x 1 attempt. Spinal needle aspirated freely following injection. Needle was carefully withdrawn, and pt tolerated procedure well. Loss of motor and sensory on exam post injection. Dr Tobias Alexander at bedside during entire placement.

## 2020-11-15 NOTE — Op Note (Signed)
NAME: Michael Huffman, Michael Huffman. MEDICAL RECORD NO: [id number] ACCOUNT NO: [id number] DATE OF BIRTH: Aug 13, 1951 FACILITY: [facility] LOCATION: [facility] PHYSICIAN: Doran Heater. Veverly Fells, MD  Operative Report   DATE OF PROCEDURE: 11/15/2020  DATE OF PROCEDURE:  11/15/2020  PREOPERATIVE DIAGNOSIS:  Left knee end-stage arthritis.  POSTOPERATIVE DIAGNOSIS:  Left knee end-stage arthritis.  PROCEDURE PERFORMED:  Left total knee replacement using DePuy Attune prosthesis.  ATTENDING SURGEON:  Doran Heater. Veverly Fells, MD  ASSISTANT:  Charletta Cousin [place], [place], who was scrubbed during the entire procedure and necessary for satisfactory completion of surgery.  Spinal anesthesia plus adductor canal block was utilized.  ESTIMATED BLOOD LOSS: Minimal.  FLUID REPLACEMENT:  1500 mL crystalloid.  COUNTS: Instrument counts correct.  COMPLICATIONS: No complications.   ANTIBIOTICS: Perioperative antibiotics were given.  INDICATIONS:  The patient is a 70 year old male with a history of worsening left knee pain secondary to end-stage arthritis.  The patient has bone-on-bone findings on x-ray and has had failed an extended period of conservative management, presents for operative  treatment.  Informed consent obtained.  DESCRIPTION OF PROCEDURE:   After an adequate level of anesthesia was achieved, the patient was positioned in the supine position.  Left leg was correctly identified.  A nonsterile tourniquet was placed on the proximal thigh.  Left leg sterilely prepped and draped in the usual  manner.  Timeout called, verifying correct patient, correct site.  We elevated the leg and exsanguinated with an Esmarch bandage.  We then elevated the tourniquet to 325 mmHg.  We placed the knee in flexion.  A longitudinal midline incision was created  with a 10 blade scalpel.  Dissection down through subcutaneous tissues using the scalpel.  We identified the parapatellar tissues and performed a medial parapatellar arthrotomy  with the fresh 10 blade scalpel.  We everted the patella, divided the lateral  patellofemoral ligaments.  Eburnated bone was noted on the distal femur and the undersurface of the patella.  We entered the distal femur with a step cut drill.  We then placed our intramedullary resection guide resecting 5 degrees left with 9 mm off  the distal femur.  We then went ahead and sized our femur to a size 6 anterior down, performed anterior, posterior, and chamfer cuts with the 4-in-1 block, removed ACL and PCL meniscal tissue subluxing the tibia anteriorly and then performing our tibial  cut with an external tibial jig.  We performed a perpendicular cut to the long axis of the tibia with minimal posterior slope for this posterior cruciate substituting prosthesis.  Once we had our tibial resection done, we used a lamina spreader and  removed excess osteophytes off the posterior femoral condyles.  We then injected the posterior capsule with a combination of Marcaine, Exparel, and saline.  Next, we went ahead and checked our flexion and extension gaps, which were symmetric at 8 mm.  We  then completed our tibial preparation with a modular drill and keel punch for the size 7 tibia.  We then went and did our box cut for the posterior cruciate substituting femoral component with the box cut guide and we drilled our lug holes.  Next, we  placed the trial component into place, reduced the knee with initially an 8 spacer, and then we placed the knee in extension.  We went ahead and resurfaced the patella going from a 27 mm thickness down to a 17 using the patellar cutting guide with an  oscillating saw.  We drilled our lug holes for the 41  patella, placed a 41 patellar button in place, and then reduced the patella and ranged the knee and had excellent patellar tracking with no-touch technique.  We removed all trial components, pulse  irrigated the bone, vacuum mixed high-viscosity cement on the back table and dried the bone  well.  We then cemented the tibia, femur, and the patella in place.  We used a patellar clamp to hold the patella during cement curing process.  We also placed  the knee in extension with an 8 poly trial.  Once the cement was set, we did go ahead and inject Exparel and Marcaine in the anterior aspect of the knee and into the capsule.  At this point, we selected the real size 12 poly.  We reduced first with the  10 and we felt like we get to 12 mm, so we selected the real 12.  We made sure we removed all excess cement with the curved osteotome.  Once we were sure that there was no remaining cement to be retrieved, we placed the real size 6, 12 poly onto the  tibia and then reduced the knee.  We had nice little pop as that medial condyle reduced.  We were able to get full extension, excellent flexion stability.  We then repaired our parapatellar arthrotomy with #1 Vicryl suture followed by 2-0 Vicryl for  subcutaneous closure and 4-0 Monocryl for skin.  Steri-Strips were applied followed by sterile dressing and a knee immobilizer.  The patient transported to recovery room in stable condition.   [facility] D: 11/15/2020 9:18:46 am T: 11/15/2020 11:59:00 am  JOB: [id number]/ [id number]

## 2020-11-15 NOTE — Discharge Instructions (Signed)
Ice to the knee constantly.  Keep the incision covered and clean and dry for one week, then ok to get it wet in the shower.  Do exercise as instructed every hour, please to prevent stiffness.    DO NOT prop anything under the knee, it will make your knee stiff.  Prop under the ankle to encourage your knee to go straight.   Use the walker while you are up and around for balance.  Wear your support stockings 24/7 to prevent blood clots and take baby aspirin twice daily for 30 days also to prevent blood clots  Follow up with Dr Satya Buttram in two weeks in the office, call [phone] for appt  

## 2020-11-15 NOTE — Progress Notes (Signed)
Orthopedic Tech Progress Note Patient Details:  Michael Huffman 07-09-1951 [id number]  Ortho Devices Type of Ortho Device: Bone foam zero knee Ortho Device/Splint Location: cpm off Ortho Device/Splint Interventions: Ordered,Application,Adjustment   Post Interventions Patient Tolerated: Well Instructions Provided: Care of device   [name] 11/15/2020, 3:14 PM

## 2020-11-15 NOTE — Anesthesia Postprocedure Evaluation (Signed)
Anesthesia Post Note  Patient: Michael Huffman  Procedure(s) Performed: TOTAL KNEE ARTHROPLASTY (Left Knee)     Patient location during evaluation: PACU Anesthesia Type: Regional and Spinal Level of consciousness: awake and alert Pain management: pain level controlled Vital Signs Assessment: post-procedure vital signs reviewed and stable Respiratory status: spontaneous breathing and respiratory function stable Cardiovascular status: blood pressure returned to baseline and stable Postop Assessment: spinal receding Anesthetic complications: no   No complications documented.  Last Vitals:  Vitals:   11/15/20 1015 11/15/20 1030  BP: 120/72 125/74  Pulse: (!) 59 (!) 56  Resp: 11 14  Temp:    SpO2: 100% 100%    Last Pain:  Vitals:   11/15/20 1030  TempSrc:   PainSc: 0-No pain                 Disney Ruggiero DANIEL

## 2020-11-15 NOTE — Care Plan (Signed)
Ortho Bundle Case Management Note  Patient Details  Name: Michael Huffman MRN: [id number] Date of Birth: [birth year]                  L TKA on 11/15/20. DCP: Home with wife. 2 story home with 3 steps. DME: No needs. Has RW & elevated toilets. Doesn't want 3in1. PT: EO 3/2   DME Arranged:  N/A DME Agency:     HH Arranged:    [facility] Agency:     Additional Comments: Please contact me with any questions of if this plan should need to change.  Marianne Sofia, RN,CCM [facility]  [phone] 11/15/2020, 8:05 AM

## 2020-11-15 NOTE — H&P (Signed)
Patient's anticipated LOS is less than 2 midnights, meeting these requirements: - Younger than 37 - Lives within 1 hour of care - Has a competent adult at home to recover with post-op recover - NO history of  - Chronic pain requiring opiods  - Diabetes  - Coronary Artery Disease  - Heart failure  - Heart attack  - Stroke  - DVT/VTE  - Cardiac arrhythmia  - Respiratory Failure/COPD  - Renal failure  - Anemia  - Advanced Liver disease       Michael Huffman is an 70 y.o. male.    Chief Complaint: left knee pain  HPI: Pt is a 70 y.o. male complaining of left knee pain for multiple years. Pain had continually increased since the beginning. 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 Pt has tried various conservative treatments which have failed to alleviate their symptoms, including injections and therapy. Various options are discussed with the patient. Risks, benefits and expectations were discussed with the patient. Patient understand the risks, benefits and expectations and wishes to proceed with surgery.   PCP:  Wenda Low, MD  D/C Plans: Home  PMH: Past Medical History:  Diagnosis Date  . Chronically dry eyes, bilateral   . GERD (gastroesophageal reflux disease)   . Hiatal hernia   . History of colon polyps 01/12/2013   hyperplastic  . History of external beam radiation therapy    face and right thigh , completed 2006 /  right forearm ,  4000 Gy,  completed 06/ 2017  . History of kidney stones   . History of radiation therapy    of lymphoma on skin: face and leg  . Hyperlipidemia   . Hypertension   . 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 OSA on CPAP   . 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 SSBE (short-segment Barrett's esophagus)     PSH: Past Surgical History:  Procedure Laterality Date  . COLONOSCOPY WITH ESOPHAGOGASTRODUODENOSCOPY (EGD)  01/12/2014   in [place]  . CYSTOSCOPY/URETEROSCOPY/HOLMIUM LASER/STENT PLACEMENT Bilateral 08/04/2017   Procedure: CYSTOSCOPY/ RETROGRADE/URETEROSCOPY/HOLMIUM LASER/STENT PLACEMENT;  Surgeon: Ceasar Mons, MD;  Location: [facility];  Service: Urology;  Laterality: Bilateral;  . ESOPHAGOGASTRODUODENOSCOPY    . EXTRACORPOREAL SHOCK WAVE LITHOTRIPSY  1990s  . HYDROCELE EXCISION Left 2000  approx.  . TONSILLECTOMY  child  . TOTAL KNEE ARTHROPLASTY Right 04/01/2018   Procedure: RIGHT TOTAL KNEE ARTHROPLASTY;  Surgeon: Netta Cedars, MD;  Location: [facility];  Service: Orthopedics;  Laterality: Right;    Social History:  reports that he quit smoking about 31 years ago. His smoking use included cigarettes. He has a 44.00 pack-year smoking history. He has never used smokeless tobacco. He reports current alcohol use of about 8.0 standard drinks of alcohol per week. He reports that he does not use drugs.  Allergies:  No Known Allergies  Medications: Current Facility-Administered Medications  Medication Dose Route Frequency Provider Last Rate Last Admin  . bupivacaine liposome (EXPAREL) 1.3 % injection 266 mg  20 mL Other  Once Netta Cedars, MD      . ceFAZolin (ANCEF) IVPB 2g/100 mL premix  2 g Intravenous On Call to OR Cline Crock, PA-C      . lactated ringers infusion   Intravenous Continuous Suzette Battiest, MD 10 mL/hr at 11/15/20 4008 Continued from Pre-op at 11/15/20 0639  . tranexamic acid (CYKLOKAPRON) IVPB 1,000 mg  1,000 mg Intravenous To OR Bently Wyss, Robb Matar, PA-C       Facility-Administered Medications Ordered in  Other Encounters  Medication Dose Route Frequency Provider Last Rate Last Admin  . ropivacaine (PF) 7.5 mg/mL (0.75%) (NAROPIN) injection   Peri-NEURAL Anesthesia Intra-op Duane Boston, MD   20 mL at 11/15/20 0703    No results found for this or any previous visit (from the past 23 hour(s)). No results found.  ROS: Pain with rom of the left lower extremity  Physical Exam: Alert and oriented 70 y.o. male in no acute distress Cranial nerves 2-12 intact Cervical spine: full rom with no tenderness, nv intact distally Chest: active breath sounds bilaterally, no wheeze rhonchi or rales Heart: regular rate and rhythm, no murmur Abd: non tender non distended with active bowel sounds Hip is stable with rom  Left knee painful rom nv intact distally Antalgic gait  Assessment/Plan Assessment: left knee pain end stage osteoarthritis  Plan:  Patient will undergo a left total knee by Dr. Veverly Fells at [facility] Risks benefits and expectations were discussed with the patient. Patient understand risks, benefits and expectations and wishes to proceed. Preoperative templating of the joint replacement has been completed, documented, and submitted to the Operating Room personnel in order to optimize intra-operative equipment management.   Merla Riches PA-C, MPAS [facility]opa[facility]ics is now [employer] [address], [place], [place], [place] [address] Phone: [phone] [email or web address] Facebook  [employer]

## 2020-11-16 ENCOUNTER — Encounter (HOSPITAL_COMMUNITY): Payer: Self-pay | Admitting: Orthopedic Surgery

## 2020-11-16 NOTE — Progress Notes (Signed)
Physical Therapy Treatment Patient Details Name: Michael Huffman MRN: [id number] DOB: [birth year] Today's Date: 11/16/2020    History of Present Illness Pt s/p L TKR and with hx of macular degeneration, and R TKR    PT Comments    Pt ambulated 20' with RW, distance limited by lightheadedness, assisted pt to chair where BP was 176/95, HR 79, SaO2 96% on room air. Pain 7/10 in L knee with walking despite premedication with 15 mg oxycodone.  Initiated TKA HEP. Will plan to see pt for a second session this afternoon.    Follow Up Recommendations  Follow surgeon's recommendation for DC plan and follow-up therapies     Equipment Recommendations  None recommended by PT    Recommendations for Other Services       Precautions / Restrictions Precautions Precautions: Knee;Fall Required Braces or Orthoses: Knee Immobilizer - Left Knee Immobilizer - Left: Discontinue once straight leg raise with < 10 degree lag Restrictions Weight Bearing Restrictions: No LLE Weight Bearing: Weight bearing as tolerated    Mobility  Bed Mobility Overal bed mobility: Modified Independent Bed Mobility: Supine to Sit     Supine to sit: Modified independent (Device/Increase time)     General bed mobility comments: used gait belt as leg lifter to self assist LLE OOB    Transfers Overall transfer level: Needs assistance Equipment used: Rolling walker (2 wheeled) Transfers: Sit to/from Stand Sit to Stand: Min assist         General transfer comment: cues for LE management and use of UEs to self assist, min A to power up  Ambulation/Gait Ambulation/Gait assistance: Min guard Gait Distance (Feet): 20 Feet Assistive device: Rolling walker (2 wheeled) Gait Pattern/deviations: Step-to pattern;Decreased step length - right;Decreased step length - left;Shuffle;Trunk flexed Gait velocity: decr   General Gait Details: cues for sequence, posture and position from RW, distance limited by lightheadedness.  Assisted pt to chair where BP was 176/95, SaO2 96%, HR 79, RN notified of elevated BP.   Stairs             Wheelchair Mobility    Modified Rankin (Stroke Patients Only)       Balance Overall balance assessment: Needs assistance Sitting-balance support: No upper extremity supported;Feet supported Sitting balance-Leahy Scale: Good     Standing balance support: Bilateral upper extremity supported Standing balance-Leahy Scale: Poor                              Cognition Arousal/Alertness: Awake/alert Behavior During Therapy: WFL for tasks assessed/performed Overall Cognitive Status: Within Functional Limits for tasks assessed                                        Exercises Total Joint Exercises Ankle Circles/Pumps: AROM;Both;15 reps;Supine Quad Sets: AROM;Left;5 reps;Supine Short Arc Quad: AAROM;Left;5 reps;Supine Heel Slides: AAROM;Left;Supine;5 reps Hip ABduction/ADduction: AAROM;Left;5 reps;Supine Straight Leg Raises: AAROM;Left;5 reps;Supine    General Comments        Pertinent Vitals/Pain Pain Score: 7  Pain Location: L knee Pain Descriptors / Indicators: Aching;Sore;Grimacing Pain Intervention(s): Limited activity within patient's tolerance;Monitored during session;Premedicated before session;Ice applied    Home Living                      Prior Function  PT Goals (current goals can now be found in the care plan section) Acute Rehab PT Goals Patient Stated Goal: yeardwork PT Goal Formulation: With patient Time For Goal Achievement: 11/22/20 Potential to Achieve Goals: Good Progress towards PT goals: Progressing toward goals    Frequency    7X/week      PT Plan Current plan remains appropriate    Co-evaluation              AM-PAC PT "6 Clicks" Mobility   Outcome Measure  Help needed turning from your back to your side while in a flat bed without using bedrails?: A Little Help  needed moving from lying on your back to sitting on the side of a flat bed without using bedrails?: A Little Help needed moving to and from a bed to a chair (including a wheelchair)?: A Little Help needed standing up from a chair using your arms (e.g., wheelchair or bedside chair)?: A Little Help needed to walk in hospital room?: A Little Help needed climbing 3-5 steps with a railing? : A Lot 6 Click Score: 17    End of Session Equipment Utilized During Treatment: Gait belt;Left knee immobilizer Activity Tolerance: Patient limited by fatigue;Treatment limited secondary to medical complications (Comment) (lightheadedness with walking) Patient left: in chair;with call bell/phone within reach;with chair alarm set Nurse Communication: Mobility status;Other (comment) (lightheadedness with walking) PT Visit Diagnosis: Unsteadiness on feet (R26.81);Difficulty in walking, not elsewhere classified (R26.2)     Time: 6063-0160 PT Time Calculation (min) (ACUTE ONLY): 29 min  Charges:  $Gait Training: 8-22 mins $Therapeutic Exercise: 8-22 mins                     Blondell Reveal Kistler PT 11/16/2020  Acute Rehabilitation Services Pager [phone] Office [phone]

## 2020-11-16 NOTE — Progress Notes (Signed)
Spoke with Dr. Stann Mainland r/t pts consistant elevated BP, Dr. Stann Mainland states no new orders at this time

## 2020-11-16 NOTE — Progress Notes (Signed)
   Subjective: Patient is s/p the below listed procedures.  Patient reports pain as mild to moderate.  He has been icing and worked with therapy. Denies numbness or tingling. Denies fever or chills.     DATE OF PROCEDURE:  11/15/2020  PREOPERATIVE DIAGNOSIS:  Left knee end-stage arthritis.  POSTOPERATIVE DIAGNOSIS:  Left knee end-stage arthritis.  PROCEDURE PERFORMED:  Left total knee replacement using DePuy Attune prosthesis.  ATTENDING SURGEON:  Doran Heater. Veverly Fells, MD Objective:   VITALS:   Vitals:   11/16/20 0011 11/16/20 0201 11/16/20 0538 11/16/20 0922  BP:  (!) 159/84 (!) 167/87 (!) 160/90  Pulse: 67 70 89 77  Resp: 16 18 19 19   Temp:  98.5 F (36.9 C) 98.3 F (36.8 C) 98.2 F (36.8 C)  TempSrc:    Oral  SpO2: 99% 97% 95% 95%  Weight:      Height:      Constitutional: General Appearance: healthy-appearing, well-nourished, and well-developed.Laying in hospital bed.  Level of Distress: no acute distress. Eyes: Lens (normal) clear: both eyes. Head: Head: normocephalic and atraumatic. Lungs: Respiratory effort: no dyspnea. Skin: Inspection and palpation: no rash, lesions Neurologic: Cranial Nerves: grossly intact. Sensation: grossly intact. Psychiatric: Insight: good judgement and insight. Mental Status: normal mood and affect and active and alert. Orientation: to time, place, and person.   Left Lower Extremity:  Neurologically intact Neurovascular intact Sensation intact distally Intact pulses distally Dorsiflexion/Plantar flexion intact Incision: dressing C/D/I Compartment soft Knee immobilizer with ice on the knee.   Lab Results  Component Value Date   WBC 5.3 11/07/2020   HGB 16.1 11/07/2020   HCT 48.9 11/07/2020   MCV 86.4 11/07/2020   PLT 238 11/07/2020   BMET    Component Value Date/Time   NA 140 11/07/2020 0946   K 4.3 11/07/2020 0946   CL 103 11/07/2020 0946   CO2 28 11/07/2020 0946   GLUCOSE 106 (H) 11/07/2020 0946   BUN 14  11/07/2020 0946   CREATININE 0.83 11/07/2020 0946   CALCIUM 9.2 11/07/2020 0946   GFRNONAA >60 11/07/2020 0946   GFRAA >60 07/06/2019 1012     Assessment/Plan: 1 Day Post-Op   Active Problems:   Status post total knee replacement, left   Advance diet Up with therapy Discharge tomorrow or Monday depending on symptoms, progress  Weightbearing Status: WBAT with assistive device LLE , knee immobilizer once SLR with less than <10 degree lag DVT Prophylaxis: Aspirin 81mg  BID  Follow up with Dr. Veverly Fells in 2 weeks.   Faythe Casa 11/16/2020, 9:28 AM  Jonelle Sidle PA-C  Physician Assistant with Dr. Lillia Abed [place]

## 2020-11-16 NOTE — Progress Notes (Signed)
Physical Therapy Treatment Patient Details Name: Michael Huffman MRN: [id number] DOB: 01/20/1951 Today's Date: 11/16/2020    History of Present Illness Pt s/p L TKR and with hx of macular degeneration, and R TKR    PT Comments    Pt ambulated 25' with RW, distance limited by L knee pain and lightheadedness. Assisted pt back to bed after ambulating where BP was 182/88, HR 88, SaO2 96% on room air, RN notified of hypertension. Pt had loss of balance requiring mod A during recliner to bed transfer at end of session, he reported increased lightheadedness. Progress is limited by pain and lightheadedness. Pt had 15 mg oxycodone prior to this session, RN recommended to pt trial of 10 mg next time pain medication is due.    Follow Up Recommendations  Follow surgeon's recommendation for DC plan and follow-up therapies     Equipment Recommendations  None recommended by PT    Recommendations for Other Services       Precautions / Restrictions Precautions Precautions: Knee;Fall Required Braces or Orthoses: Knee Immobilizer - Left Knee Immobilizer - Left: Discontinue once straight leg raise with < 10 degree lag Restrictions LLE Weight Bearing: Weight bearing as tolerated    Mobility  Bed Mobility Overal bed mobility: Needs Assistance Bed Mobility: Sit to Supine     Supine to sit: Modified independent (Device/Increase time) Sit to supine: Min guard   General bed mobility comments: self assisted LLE with RLE into bed    Transfers Overall transfer level: Needs assistance Equipment used: Rolling walker (2 wheeled) Transfers: Sit to/from Stand Sit to Stand: Min assist         General transfer comment: cues for LE management and use of UEs to self assist, min A to power up  Ambulation/Gait Ambulation/Gait assistance: Min guard Gait Distance (Feet): 25 Feet Assistive device: Rolling walker (2 wheeled) Gait Pattern/deviations: Step-to pattern;Decreased step length -  right;Decreased step length - left;Shuffle;Trunk flexed Gait velocity: decr   General Gait Details: distance limited by lightheadedness. Assisted pt to chair where BP was 182/88, SaO2 96% on room air, HR 88, RN notified of elevated BP.   Stairs             Wheelchair Mobility    Modified Rankin (Stroke Patients Only)       Balance Overall balance assessment: Needs assistance Sitting-balance support: No upper extremity supported;Feet supported Sitting balance-Leahy Scale: Good     Standing balance support: Bilateral upper extremity supported Standing balance-Leahy Scale: Poor                              Cognition Arousal/Alertness: Awake/alert Behavior During Therapy: WFL for tasks assessed/performed Overall Cognitive Status: Within Functional Limits for tasks assessed                                        Exercises Total Joint Exercises Ankle Circles/Pumps: AROM;Both;15 reps;Supine Quad Sets: AROM;Left;5 reps;Supine Short Arc Quad: AAROM;Left;5 reps;Supine Heel Slides: AAROM;Left;Supine;5 reps Hip ABduction/ADduction: AAROM;Left;5 reps;Supine Straight Leg Raises: AAROM;Left;5 reps;Supine    General Comments        Pertinent Vitals/Pain Pain Score: 8  Pain Location: L knee Pain Descriptors / Indicators: Aching;Sore;Grimacing Pain Intervention(s): Limited activity within patient's tolerance;Monitored during session;Premedicated before session;Ice applied;Repositioned    Home Living  Prior Function            PT Goals (current goals can now be found in the care plan section) Acute Rehab PT Goals Patient Stated Goal: yardwork PT Goal Formulation: With patient Time For Goal Achievement: 11/22/20 Potential to Achieve Goals: Good Progress towards PT goals: Progressing toward goals    Frequency    7X/week      PT Plan Current plan remains appropriate    Co-evaluation               AM-PAC PT "6 Clicks" Mobility   Outcome Measure  Help needed turning from your back to your side while in a flat bed without using bedrails?: A Little Help needed moving from lying on your back to sitting on the side of a flat bed without using bedrails?: A Little Help needed moving to and from a bed to a chair (including a wheelchair)?: A Little Help needed standing up from a chair using your arms (e.g., wheelchair or bedside chair)?: A Little Help needed to walk in hospital room?: A Little Help needed climbing 3-5 steps with a railing? : A Lot 6 Click Score: 17    End of Session Equipment Utilized During Treatment: Gait belt;Left knee immobilizer Activity Tolerance: Patient limited by fatigue;Treatment limited secondary to medical complications (Comment) (lightheadedness with walking) Patient left: with call bell/phone within reach;in bed;with family/visitor present Nurse Communication: Mobility status;Other (comment) (lightheadedness with walking) PT Visit Diagnosis: Unsteadiness on feet (R26.81);Difficulty in walking, not elsewhere classified (R26.2)     Time: 0932-6712 PT Time Calculation (min) (ACUTE ONLY): 28 min  Charges:  $Gait Training: 8-22 mins $Therapeutic Exercise: 8-22 mins                     Blondell Reveal Kistler PT 11/16/2020  Acute Rehabilitation Services Pager [phone] Office [phone]

## 2020-11-16 NOTE — TOC Progression Note (Signed)
Transition of Care [facility]) - Progression Note    Patient Details  Name: Michael Huffman MRN: [id number] Date of Birth: 1950/12/19  Transition of [facility] CM/SW Contact  Joaquin Courts, RN Phone Number: 11/16/2020, 11:16 AM  Clinical Narrative:    Patient plans to dc with HEP, has RW and declines 3in1.  Expected Discharge Plan: Home/Self Care Barriers to Discharge: No Barriers Identified  Expected Discharge Plan and Services Expected Discharge Plan: Home/Self Care   Discharge Planning Services: CM Consult   Living arrangements for the past 2 months: Single Family Home                 DME Arranged: N/A                     Social Determinants of Health (SDOH) Interventions    Readmission Risk Interventions No flowsheet data found.

## 2020-11-17 NOTE — Progress Notes (Signed)
Physical Therapy Treatment Patient Details Name: Michael Huffman MRN: [id number] DOB: [birth year] Today's Date: 11/17/2020    History of Present Illness Pt s/p L TKR and with hx of macular degeneration, and R TKR    PT Comments    Patient tolerated ambulating x 60', practiced  2 steps with crutch and rail. Performed safely, will have 2 persons assisting at home.  PATIENT ABSOLUTELY REQUIRES KI FOR NEGOTIATING STEPS, UNABLE TO PERFORM SLR AT THIS TIME. PATIENT AND RN AWARE. Patient anxious to DC home today.  Follow Up Recommendations  Follow surgeon's recommendation for DC plan and follow-up therapies     Equipment Recommendations  None recommended by PT    Recommendations for Other Services       Precautions / Restrictions Precautions Precautions: Knee;Fall Required Braces or Orthoses: Knee Immobilizer - Left Knee Immobilizer - Left: Discontinue once straight leg raise with < 10 degree lag Restrictions LLE Weight Bearing: Weight bearing as tolerated    Mobility  Bed Mobility   Bed Mobility: Supine to Sit     Supine to sit: Modified independent (Device/Increase time)     General bed mobility comments: self assisted LLE with gait belt    Transfers Overall transfer level: Needs assistance Equipment used: Rolling walker (2 wheeled)   Sit to Stand: Min guard         General transfer comment: cues for LE management and use of UEs to self assist, min A to power up  Ambulation/Gait Ambulation/Gait assistance: Min guard Gait Distance (Feet): 60 Feet Assistive device: Rolling walker (2 wheeled) Gait Pattern/deviations: Step-to pattern;Decreased step length - right;Decreased step length - left;Antalgic Gait velocity: decr   General Gait Details: pt.  tolerated well,   Stairs Stairs: Yes Stairs assistance: Min assist Stair Management: One rail Right;Forwards;With crutches Number of Stairs: 2 General stair comments: close guarding of left knee.   Wheelchair  Mobility    Modified Rankin (Stroke Patients Only)       Balance   Sitting-balance support: No upper extremity supported;Feet supported Sitting balance-Leahy Scale: Good     Standing balance support: Bilateral upper extremity supported Standing balance-Leahy Scale: Poor Standing balance comment: reliant on UE support                            Cognition Arousal/Alertness: Awake/alert Behavior During Therapy: WFL for tasks assessed/performed Overall Cognitive Status: Within Functional Limits for tasks assessed                                        Exercises Total Joint Exercises Ankle Circles/Pumps: AROM;Both;15 reps;Supine Quad Sets: AROM;Left;5 reps;Supine    General Comments        Pertinent Vitals/Pain Pain Score: 7  Pain Location: L knee after ambulation Pain Descriptors / Indicators: Aching;Sore;Grimacing Pain Intervention(s): Monitored during session;Premedicated before session;Ice applied    Home Living                      Prior Function            PT Goals (current goals can now be found in the care plan section) Progress towards PT goals: Progressing toward goals    Frequency    7X/week      PT Plan Current plan remains appropriate    Co-evaluation  AM-PAC PT "6 Clicks" Mobility   Outcome Measure  Help needed turning from your back to your side while in a flat bed without using bedrails?: A Little Help needed moving from lying on your back to sitting on the side of a flat bed without using bedrails?: A Little Help needed moving to and from a bed to a chair (including a wheelchair)?: A Little Help needed standing up from a chair using your arms (e.g., wheelchair or bedside chair)?: A Little Help needed to walk in hospital room?: A Little Help needed climbing 3-5 steps with a railing? : A Lot 6 Click Score: 17    End of Session Equipment Utilized During Treatment: Gait belt;Left  knee immobilizer Activity Tolerance: Patient tolerated treatment well Patient left: in chair;with call bell/phone within reach Nurse Communication: Mobility status;Other (comment) PT Visit Diagnosis: Unsteadiness on feet (R26.81);Difficulty in walking, not elsewhere classified (R26.2);Pain Pain - Right/Left: Left Pain - part of body: Knee     Time: 3016-0109 PT Time Calculation (min) (ACUTE ONLY): 35 min  Charges:  $Gait Training: 8-22 mins $Therapeutic Exercise: 8-22 mins                     Tresa Endo PT Acute Rehabilitation Services Pager [phone] Office [phone]    Claretha Cooper 11/17/2020, 11:38 AM

## 2020-11-17 NOTE — Progress Notes (Signed)
   Subjective: Patient is s/p the below listed procedures.  Patient reports pain as mild to moderate.  He has been icing and worked with therapy. Denies numbness or tingling. Denies fever or chills.     DATE OF PROCEDURE:  11/15/2020  PREOPERATIVE DIAGNOSIS:  Left knee end-stage arthritis.  POSTOPERATIVE DIAGNOSIS:  Left knee end-stage arthritis.  PROCEDURE PERFORMED:  Left total knee replacement using DePuy Attune prosthesis.  ATTENDING SURGEON:  Doran Heater. Veverly Fells, MD Objective:   VITALS:   Vitals:   11/16/20 1430 11/16/20 1436 11/16/20 2130 11/17/20 0539  BP: (!) 182/88 (!) 156/78 (!) 155/87 (!) 146/82  Pulse: 88 87 93 87  Resp:  16 18 17   Temp:  98.4 F (36.9 C) 98.6 F (37 C) 98.3 F (36.8 C)  TempSrc:  Oral Oral Oral  SpO2: 96% 93% 95% 92%  Weight:      Height:      Constitutional: General Appearance: healthy-appearing, well-nourished, and well-developed.Laying in hospital bed.  Level of Distress: no acute distress. Eyes: Lens (normal) clear: both eyes. Head: Head: normocephalic and atraumatic. Lungs: Respiratory effort: no dyspnea. Skin: Inspection and palpation: no rash, lesions Neurologic: Cranial Nerves: grossly intact. Sensation: grossly intact. Psychiatric: Insight: good judgement and insight. Mental Status: normal mood and affect and active and alert. Orientation: to time, place, and person.   Left Lower Extremity:  Neurologically intact Neurovascular intact Sensation intact distally Intact pulses distally Dorsiflexion/Plantar flexion intact Incision: dressing C/D/I Compartment soft Knee immobilizer with ice on the knee.   Lab Results  Component Value Date   WBC 5.3 11/07/2020   HGB 16.1 11/07/2020   HCT 48.9 11/07/2020   MCV 86.4 11/07/2020   PLT 238 11/07/2020   BMET    Component Value Date/Time   NA 140 11/07/2020 0946   K 4.3 11/07/2020 0946   CL 103 11/07/2020 0946   CO2 28 11/07/2020 0946   GLUCOSE 106 (H) 11/07/2020 0946    BUN 14 11/07/2020 0946   CREATININE 0.83 11/07/2020 0946   CALCIUM 9.2 11/07/2020 0946   GFRNONAA >60 11/07/2020 0946   GFRAA >60 07/06/2019 1012     Assessment/Plan: 2 Days Post-Op   Active Problems:   Status post total knee replacement, left   Discharge home   Weightbearing Status: WBAT with assistive device LLE , knee immobilizer  DVT Prophylaxis: Aspirin 81mg  BID  Follow up with Dr. Veverly Fells in 2 weeks.   Dorothyann Peng 11/17/2020, 12:08 PM

## 2020-11-17 NOTE — Plan of Care (Signed)
Pt to d/c home today 

## 2020-11-17 NOTE — Progress Notes (Signed)
Pt stable at this time. No needs at this time. Pt to d/c home with family when family arrives. Pt ace wrap in place.

## 2020-11-17 NOTE — Plan of Care (Signed)
  Problem: Clinical Measurements: Goal: Diagnostic test results will improve Outcome: Progressing   Problem: Clinical Measurements: Goal: Respiratory complications will improve Outcome: Progressing   Problem: Clinical Measurements: Goal: Cardiovascular complication will be avoided Outcome: Progressing   Problem: Pain Managment: Goal: General experience of comfort will improve Outcome: Progressing   

## 2020-11-17 NOTE — Discharge Summary (Signed)
Physician Discharge Summary  Patient ID: NATHANYL ANDUJO MRN: [id number] DOB/AGE: 1951-08-13 70 y.o.  Admit date: 11/15/2020 Discharge date: 11/17/2020  Admission Diagnoses:  Osteoarthritis of the knee, left  Discharge Diagnoses:  Active Problems:   Status post total knee replacement, left   Past Medical History:  Diagnosis Date  . Chronically dry eyes, bilateral   . GERD (gastroesophageal reflux disease)   . Hiatal hernia   . History of colon polyps 01/12/2013   hyperplastic  . History of external beam radiation therapy    face and right thigh , completed 2006 /  right forearm ,  4000 Gy,  completed 06/ 2017  . History of kidney stones   . History of radiation therapy    of lymphoma on skin: face and leg  . Hyperlipidemia   . Hypertension   . 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 OSA on CPAP   . Primary cutaneous lymphoma [facility]) oncologist-  dr Charlaine Dalton (cancer center [place])--- per lov note 10--04-2017 -- no recurrence clinically   dx 2006 -- face and right thigh cutaneous lymphoma s/p radiation therapy (done in [place]);  2012 right forearm knot s/p bx- may 2015 nodular lymphocytic infiltrate s/o immunocytoma;  04/ 2017  cutaneous marginal zone B Cell lymphoma , right forearm (punch bx [id number])  s/p  radiation therapy 06/ 2017  . SSBE (short-segment Barrett's esophagus)     Surgeries: Procedure(s): TOTAL KNEE ARTHROPLASTY on 11/15/2020   Consultants (if any):   Discharged Condition: Improved  Hospital Course: HIRO VIPOND is an 70 y.o. male who was admitted 11/15/2020 with a diagnosis of osteoarthritis of the knee, left and went to the operating room on 11/15/2020 and underwent the above named procedures.    He was given perioperative antibiotics:  Anti-infectives (From admission, onward)   Start     Dose/Rate Route Frequency Ordered Stop   11/15/20 1330  ceFAZolin (ANCEF) IVPB 2g/100 mL premix        2 g 200 mL/hr over 30 Minutes Intravenous Every 6  hours 11/15/20 1230 11/15/20 1927   11/15/20 0600  ceFAZolin (ANCEF) IVPB 2g/100 mL premix        2 g 200 mL/hr over 30 Minutes Intravenous On call to O.R. 11/15/20 8502 11/15/20 0747    .  He was given sequential compression devices, early ambulation, and ASA for DVT prophylaxis.  He benefited maximally from the hospital stay and there were no complications.    Recent vital signs:  Vitals:   11/16/20 2130 11/17/20 0539  BP: (!) 155/87 (!) 146/82  Pulse: 93 87  Resp: 18 17  Temp: 98.6 F (37 C) 98.3 F (36.8 C)  SpO2: 95% 92%    Recent laboratory studies:  Lab Results  Component Value Date   HGB 16.1 11/07/2020   HGB 15.9 07/05/2020   HGB 15.1 07/06/2019   Lab Results  Component Value Date   WBC 5.3 11/07/2020   PLT 238 11/07/2020   No results found for: INR Lab Results  Component Value Date   NA 140 11/07/2020   K 4.3 11/07/2020   CL 103 11/07/2020   CO2 28 11/07/2020   BUN 14 11/07/2020   CREATININE 0.83 11/07/2020   GLUCOSE 106 (H) 11/07/2020    Discharge Medications:   Allergies as of 11/17/2020   No Known Allergies     Medication List    TAKE these medications   aspirin 81 MG chewable tablet Commonly known as:  Aspirin Childrens Chew 1 tablet (81 mg total) by mouth in the morning and at bedtime.   Blue-Emu Super Strength Crea Apply 1 application topically daily as needed (pain).   hydrochlorothiazide 12.5 MG tablet Commonly known as: HYDRODIURIL Take 12.5 mg by mouth daily.   losartan 100 MG tablet Commonly known as: COZAAR Take 100 mg by mouth daily.   methocarbamol 500 MG tablet Commonly known as: Robaxin Take 1 tablet (500 mg total) by mouth every 6 (six) hours as needed for muscle spasms.   multivitamin with minerals tablet Take 2 tablets by mouth daily.   PreserVision AREDS 2 Caps Take 2 capsules by mouth daily at 12 noon.   naproxen sodium 220 MG tablet Commonly known as: ALEVE Take 440 mg by mouth daily as needed (pain).    OMEGA-3 + VITAMIN D3 PO Take 2 capsules by mouth 2 (two) times daily.   omeprazole 20 MG capsule Commonly known as: PRILOSEC Take 20 mg by mouth every evening.   ondansetron 4 MG tablet Commonly known as: Zofran Take 1 tablet (4 mg total) by mouth every 8 (eight) hours as needed for nausea, vomiting or refractory nausea / vomiting.   oxyCODONE-acetaminophen 5-325 MG tablet Commonly known as: Percocet Take 1-2 tablets by mouth every 4 (four) hours as needed for severe pain.   rosuvastatin 5 MG tablet Commonly known as: CRESTOR Take 5 mg by mouth daily.   SYSTANE OP Place 1 drop into both eyes 3 (three) times daily.       Diagnostic Studies: No results found.  Disposition: Discharge disposition: 01-Home or Self Care       Discharge Instructions    Call MD / Call 911   Complete by: As directed    If you experience chest pain or shortness of breath, CALL 911 and be transported to the hospital emergency room.  If you develope a fever above 101 F, pus (white drainage) or increased drainage or redness at the wound, or calf pain, call your surgeon's office.   Constipation Prevention   Complete by: As directed    Drink plenty of fluids.  Prune juice may be helpful.  You may use a stool softener, such as Colace (over the counter) 100 mg twice a day.  Use MiraLax (over the counter) for constipation as needed.   Diet - low sodium heart healthy   Complete by: As directed    Do not put a pillow under the knee. Place it under the heel.   Complete by: As directed    Driving restrictions   Complete by: As directed    No driving for 6 weeks   Increase activity slowly as tolerated   Complete by: As directed    Lifting restrictions   Complete by: As directed    No lifting for 6 weeks   TED hose   Complete by: As directed    Use stockings (TED hose) for 2 weeks on both leg(s).  You may remove them at night for sleeping.       Follow-up Information    Netta Cedars, MD. Go on  11/28/2020.   Specialty: Orthopedic Surgery Why: You are scheduled for first post op appointment on Thursday March 10th at 9:15am. Contact information: [address] [place] [address] [phone]        Rosilyn Mings.. Go on 11/20/2020.   Why: You are scheduled for physical therapy eval on Wednesday March 2nd at 11:15am. Contact information: [place]  [address] [place] [address] [phone]        Netta Cedars, MD. Call in 2 weeks.   Specialty: Orthopedic Surgery Why: call [phone] for appt Contact information: [address] [place] [address] [phone]                Signed: Dorothyann Peng 11/17/2020, 12:13 PM

## 2020-11-18 ENCOUNTER — Encounter (HOSPITAL_COMMUNITY): Payer: Self-pay | Admitting: Orthopedic Surgery

## 2020-11-20 DIAGNOSIS — M25561 Pain in right knee: Secondary | ICD-10-CM | POA: Diagnosis not present

## 2020-11-22 DIAGNOSIS — M25561 Pain in right knee: Secondary | ICD-10-CM | POA: Diagnosis not present

## 2020-11-26 DIAGNOSIS — M25561 Pain in right knee: Secondary | ICD-10-CM | POA: Diagnosis not present

## 2020-11-28 DIAGNOSIS — Z4789 Encounter for other orthopedic aftercare: Secondary | ICD-10-CM | POA: Diagnosis not present

## 2020-11-29 DIAGNOSIS — M25562 Pain in left knee: Secondary | ICD-10-CM | POA: Diagnosis not present

## 2020-12-03 DIAGNOSIS — M25562 Pain in left knee: Secondary | ICD-10-CM | POA: Diagnosis not present

## 2020-12-10 DIAGNOSIS — M25562 Pain in left knee: Secondary | ICD-10-CM | POA: Diagnosis not present

## 2020-12-13 DIAGNOSIS — M25562 Pain in left knee: Secondary | ICD-10-CM | POA: Diagnosis not present

## 2020-12-17 DIAGNOSIS — M25562 Pain in left knee: Secondary | ICD-10-CM | POA: Diagnosis not present

## 2020-12-24 DIAGNOSIS — M25562 Pain in left knee: Secondary | ICD-10-CM | POA: Diagnosis not present

## 2020-12-27 DIAGNOSIS — M25562 Pain in left knee: Secondary | ICD-10-CM | POA: Diagnosis not present

## 2020-12-31 DIAGNOSIS — M25562 Pain in left knee: Secondary | ICD-10-CM | POA: Diagnosis not present

## 2021-01-06 DIAGNOSIS — M25561 Pain in right knee: Secondary | ICD-10-CM | POA: Diagnosis not present

## 2021-01-08 DIAGNOSIS — M419 Scoliosis, unspecified: Secondary | ICD-10-CM | POA: Insufficient documentation

## 2021-01-09 DIAGNOSIS — M5459 Other low back pain: Secondary | ICD-10-CM | POA: Diagnosis not present

## 2021-01-20 DIAGNOSIS — M545 Low back pain, unspecified: Secondary | ICD-10-CM | POA: Diagnosis not present

## 2021-01-30 DIAGNOSIS — M5136 Other intervertebral disc degeneration, lumbar region: Secondary | ICD-10-CM | POA: Diagnosis not present

## 2021-01-30 DIAGNOSIS — M545 Low back pain, unspecified: Secondary | ICD-10-CM | POA: Diagnosis not present

## 2021-02-13 DIAGNOSIS — M545 Low back pain, unspecified: Secondary | ICD-10-CM | POA: Diagnosis not present

## 2021-02-18 DIAGNOSIS — M545 Low back pain, unspecified: Secondary | ICD-10-CM | POA: Diagnosis not present

## 2021-02-19 DIAGNOSIS — M545 Low back pain, unspecified: Secondary | ICD-10-CM | POA: Diagnosis not present

## 2021-02-27 DIAGNOSIS — M545 Low back pain, unspecified: Secondary | ICD-10-CM | POA: Diagnosis not present

## 2021-03-06 DIAGNOSIS — M545 Low back pain, unspecified: Secondary | ICD-10-CM | POA: Diagnosis not present

## 2021-03-12 DIAGNOSIS — M545 Low back pain, unspecified: Secondary | ICD-10-CM | POA: Diagnosis not present

## 2021-03-17 DIAGNOSIS — E7800 Pure hypercholesterolemia, unspecified: Secondary | ICD-10-CM | POA: Diagnosis not present

## 2021-03-17 DIAGNOSIS — E669 Obesity, unspecified: Secondary | ICD-10-CM | POA: Diagnosis not present

## 2021-03-17 DIAGNOSIS — I10 Essential (primary) hypertension: Secondary | ICD-10-CM | POA: Diagnosis not present

## 2021-03-17 DIAGNOSIS — M1990 Unspecified osteoarthritis, unspecified site: Secondary | ICD-10-CM | POA: Diagnosis not present

## 2021-03-17 DIAGNOSIS — Z125 Encounter for screening for malignant neoplasm of prostate: Secondary | ICD-10-CM | POA: Diagnosis not present

## 2021-03-17 DIAGNOSIS — R7303 Prediabetes: Secondary | ICD-10-CM | POA: Diagnosis not present

## 2021-03-17 DIAGNOSIS — K219 Gastro-esophageal reflux disease without esophagitis: Secondary | ICD-10-CM | POA: Diagnosis not present

## 2021-03-17 DIAGNOSIS — M5136 Other intervertebral disc degeneration, lumbar region: Secondary | ICD-10-CM | POA: Diagnosis not present

## 2021-03-17 DIAGNOSIS — Z1389 Encounter for screening for other disorder: Secondary | ICD-10-CM | POA: Diagnosis not present

## 2021-03-17 DIAGNOSIS — Z0000 Encounter for general adult medical examination without abnormal findings: Secondary | ICD-10-CM | POA: Diagnosis not present

## 2021-03-17 DIAGNOSIS — C8590 Non-Hodgkin lymphoma, unspecified, unspecified site: Secondary | ICD-10-CM | POA: Diagnosis not present

## 2021-03-17 DIAGNOSIS — G4733 Obstructive sleep apnea (adult) (pediatric): Secondary | ICD-10-CM | POA: Diagnosis not present

## 2021-03-19 DIAGNOSIS — M545 Low back pain, unspecified: Secondary | ICD-10-CM | POA: Diagnosis not present

## 2021-05-15 DIAGNOSIS — H524 Presbyopia: Secondary | ICD-10-CM | POA: Diagnosis not present

## 2021-05-15 DIAGNOSIS — H2513 Age-related nuclear cataract, bilateral: Secondary | ICD-10-CM | POA: Diagnosis not present

## 2021-05-15 DIAGNOSIS — H04123 Dry eye syndrome of bilateral lacrimal glands: Secondary | ICD-10-CM | POA: Diagnosis not present

## 2021-06-18 DIAGNOSIS — Z23 Encounter for immunization: Secondary | ICD-10-CM | POA: Diagnosis not present

## 2021-07-03 ENCOUNTER — Other Ambulatory Visit: Payer: Self-pay | Admitting: *Deleted

## 2021-07-03 DIAGNOSIS — C884 Extranodal marginal zone B-cell lymphoma of mucosa-associated lymphoid tissue [MALT-lymphoma]: Secondary | ICD-10-CM

## 2021-07-07 ENCOUNTER — Encounter: Payer: Self-pay | Admitting: Internal Medicine

## 2021-07-07 ENCOUNTER — Inpatient Hospital Stay (HOSPITAL_BASED_OUTPATIENT_CLINIC_OR_DEPARTMENT_OTHER): Payer: Medicare Other | Admitting: Internal Medicine

## 2021-07-07 ENCOUNTER — Inpatient Hospital Stay: Payer: Medicare Other | Attending: Internal Medicine

## 2021-07-07 ENCOUNTER — Other Ambulatory Visit: Payer: Self-pay

## 2021-07-07 DIAGNOSIS — Z923 Personal history of irradiation: Secondary | ICD-10-CM | POA: Diagnosis not present

## 2021-07-07 DIAGNOSIS — C884 Extranodal marginal zone B-cell lymphoma of mucosa-associated lymphoid tissue [MALT-lymphoma]: Secondary | ICD-10-CM

## 2021-07-07 DIAGNOSIS — Z8572 Personal history of non-Hodgkin lymphomas: Secondary | ICD-10-CM | POA: Diagnosis not present

## 2021-07-07 DIAGNOSIS — M1712 Unilateral primary osteoarthritis, left knee: Secondary | ICD-10-CM | POA: Diagnosis not present

## 2021-07-07 DIAGNOSIS — Z8719 Personal history of other diseases of the digestive system: Secondary | ICD-10-CM | POA: Insufficient documentation

## 2021-07-07 DIAGNOSIS — Z87891 Personal history of nicotine dependence: Secondary | ICD-10-CM | POA: Insufficient documentation

## 2021-07-07 LAB — COMPREHENSIVE METABOLIC PANEL
ALT: 33 U/L (ref 0–44)
AST: 30 U/L (ref 15–41)
Albumin: 4.4 g/dL (ref 3.5–5.0)
Alkaline Phosphatase: 92 U/L (ref 38–126)
Anion gap: 8 (ref 5–15)
BUN: 17 mg/dL (ref 8–23)
CO2: 28 mmol/L (ref 22–32)
Calcium: 9.1 mg/dL (ref 8.9–10.3)
Chloride: 100 mmol/L (ref 98–111)
Creatinine, Ser: 0.73 mg/dL (ref 0.61–1.24)
GFR, Estimated: >60 mL/min (ref >60)
Glucose, Bld: 108 mg/dL — ABNORMAL HIGH (ref 70–99)
Potassium: 4.3 mmol/L (ref 3.5–5.1)
Sodium: 136 mmol/L (ref 135–145)
Total Bilirubin: 1.6 mg/dL — ABNORMAL HIGH (ref 0.3–1.2)
Total Protein: 7.4 g/dL (ref 6.5–8.1)

## 2021-07-07 LAB — CBC WITH DIFFERENTIAL/PLATELET
Abs Immature Granulocytes: 0.02 10*3/uL (ref 0.00–0.07)
Basophils Absolute: 0.1 10*3/uL (ref 0.0–0.1)
Basophils Relative: 1 %
Eosinophils Absolute: 0.2 10*3/uL (ref 0.0–0.5)
Eosinophils Relative: 4 %
HCT: 46.5 % (ref 39.0–52.0)
Hemoglobin: 15.5 g/dL (ref 13.0–17.0)
Immature Granulocytes: 0 %
Lymphocytes Relative: 30 %
Lymphs Abs: 1.6 10*3/uL (ref 0.7–4.0)
MCH: 28.5 pg (ref 26.0–34.0)
MCHC: 33.3 g/dL (ref 30.0–36.0)
MCV: 85.5 fL (ref 80.0–100.0)
Monocytes Absolute: 0.6 10*3/uL (ref 0.1–1.0)
Monocytes Relative: 11 %
Neutro Abs: 2.9 10*3/uL (ref 1.7–7.7)
Neutrophils Relative %: 54 %
Platelets: 246 10*3/uL (ref 150–400)
RBC: 5.44 MIL/uL (ref 4.22–5.81)
RDW: 12.8 % (ref 11.5–15.5)
WBC: 5.4 10*3/uL (ref 4.0–10.5)
nRBC: 0 % (ref 0.0–0.2)

## 2021-07-07 LAB — LACTATE DEHYDROGENASE: LDH: 137 U/L (ref 98–192)

## 2021-07-07 NOTE — Assessment & Plan Note (Addendum)
#   Right forearm nodular lesion-Status post punch Biopsy- late April 2017 [[place]]- CUTANEOUS MARGINAL ZONE B CELL LYMPHOMA. S/p RT May 2017.   # Clinically no evidence of recurrence.  STABLE.  Recommend reestablishing with dermatology regarding surveillance.   DISPOSITION:  # follow up in 1 year- MD -cbc/cmp/ldh/MD- Dr.B  Cc; Dr.Hussian; [place]/[facility].

## 2021-07-07 NOTE — Progress Notes (Signed)
[facility] NOTE  Patient Care Team: Wenda Low, MD as PCP - General (Internal Medicine) Druscilla Brownie, MD as Consulting Physician (Dermatology)  CHIEF COMPLAINTS/PURPOSE OF CONSULTATION:   Oncology History Overview Note  # CUTANEOUS LYMPHOMA- [face & Right thigh s/p RT 2006; Dr.Gold; [place]]; 2012-Right Fore Arm knot [s/p Bx- May 2015 nodular dense lymphocytic infiltrate s/o Immunocytoma]  # April 2017 CUTANEOUS MARGINAL ZONE B CELL LYMPHOMA. [Dr.Lupton; [place]] RIGHT FOREARM s/p punch Biopsy; s/p RT [facility] 2017. ]  DIAGNOSIS: CUTANEOUS B cell lymphoma  STAGE:     ;GOALS: control  CURRENT/MOST RECENT THERAPY: surveillaince     Primary cutaneous marginal zone B-cell lymphoma ([place])  06/01/2016 Initial Diagnosis   Primary cutaneous marginal zone B-cell lymphoma ([facility])      HISTORY OF PRESENTING ILLNESS: Alone.  Ambulating independently. Michael Huffman 70 y.o.  male with above history of  cutaneous marginal low-grade lymphoma right forearm at [facility] in April 2017; status post radiation in May 2017.  Patient currently does not follow-up with dermatology.  Otherwise denies any new lumps or bumps.  Patient had left knee replacement-uneventful.   Review of Systems  Constitutional:  Negative for chills, diaphoresis, fever, malaise/fatigue and weight loss.  HENT:  Negative for nosebleeds and sore throat.   Eyes:  Negative for double vision.  Respiratory:  Negative for cough, hemoptysis, sputum production, shortness of breath and wheezing.   Cardiovascular:  Negative for chest pain, palpitations, orthopnea and leg swelling.  Gastrointestinal:  Negative for abdominal pain, blood in stool, constipation, diarrhea, heartburn, melena, nausea and vomiting.  Genitourinary:  Negative for dysuria, frequency and urgency.  Musculoskeletal:  Positive for back pain and joint pain.  Skin: Negative.  Negative for itching and rash.  Neurological:  Negative  for dizziness, tingling, focal weakness, weakness and headaches.  Endo/Heme/Allergies:  Does not bruise/bleed easily.  Psychiatric/Behavioral:  Negative for depression. The patient is not nervous/anxious and does not have insomnia.     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   OSA on CPAP    Primary cutaneous lymphoma [facility]) oncologist-  dr Charlaine Dalton (cancer center )--- per lov note 10--04-2017 -- no recurrence clinically   dx 2006 -- face and right thigh cutaneous lymphoma s/p radiation therapy (done in [place]);  2012 right forearm knot s/p bx- may 2015 nodular lymphocytic infiltrate s/o immunocytoma;  04/ 2017  cutaneous marginal zone B Cell lymphoma , right forearm (punch bx [id number])  s/p  radiation therapy 06/ 2017   SSBE (short-segment Barrett's esophagus)     SURGICAL HISTORY: Past Surgical History:  Procedure Laterality Date   COLONOSCOPY WITH ESOPHAGOGASTRODUODENOSCOPY (EGD)  01/12/2014   in [place]   CYSTOSCOPY/URETEROSCOPY/HOLMIUM LASER/STENT PLACEMENT Bilateral 08/04/2017   Procedure: CYSTOSCOPY/ RETROGRADE/URETEROSCOPY/HOLMIUM LASER/STENT PLACEMENT;  Surgeon: Ceasar Mons, MD;  Location: [facility];  Service: Urology;  Laterality: Bilateral;   ESOPHAGOGASTRODUODENOSCOPY  EXTRACORPOREAL SHOCK WAVE LITHOTRIPSY  1990s   HYDROCELE EXCISION Left 2000  approx.   TONSILLECTOMY  child   TOTAL KNEE ARTHROPLASTY Right 04/01/2018   Procedure: RIGHT TOTAL KNEE  ARTHROPLASTY;  Surgeon: Netta Cedars, MD;  Location: [facility];  Service: Orthopedics;  Laterality: Right;   TOTAL KNEE ARTHROPLASTY Left 11/15/2020   Procedure: TOTAL KNEE ARTHROPLASTY;  Surgeon: Netta Cedars, MD;  Location: [facility];  Service: Orthopedics;  Laterality: Left;    SOCIAL HISTORY: moved from [place].  Social History   Socioeconomic History   Marital status: Married    Spouse name: Not on file   Number of children: Not on file   Years of education: Not on file   Highest education level: Not on file  Occupational History   Not on file  Tobacco Use   Smoking status: Former    Packs/day: 2.00    Years: 22.00    Pack years: 44.00    Types: Cigarettes    Quit date: 08/22/1989    Years since quitting: 31.8   Smokeless tobacco: Never  Vaping Use   Vaping Use: Never used  Substance and Sexual Activity   Alcohol use: Yes    Alcohol/week: 8.0 standard drinks    Types: 7 Glasses of wine, 1 Shots of liquor per week    Comment: daily  wine and martini on the weekend.   Drug use: No   Sexual activity: Not Currently  Other Topics Concern   Not on file  Social History Narrative   Not on file   Social Determinants of Health   Financial Resource Strain: Not on file  Food Insecurity: Not on file  Transportation Needs: Not on file  Physical Activity: Not on file  Stress: Not on file  Social Connections: Not on file  Intimate Partner Violence: Not on file    FAMILY HISTORY: Family History  Problem Relation Age of Onset   Diabetes Father    Heart murmur Mother    Kidney disease Mother    Aneurysm Mother    Diabetes Brother     ALLERGIES:  has No Known Allergies.  MEDICATIONS:  Current Outpatient Medications  Medication Sig Dispense Refill   Fish Oil-Cholecalciferol (OMEGA-3 + VITAMIN D3 PO) Take 2 capsules by mouth 2 (two) times daily.     hydrochlorothiazide (HYDRODIURIL) 12.5 MG tablet Take 12.5 mg by mouth daily.     losartan (COZAAR) 100 MG tablet Take 100  mg by mouth daily.     methocarbamol (ROBAXIN) 500 MG tablet Take 1 tablet (500 mg total) by mouth every 6 (six) hours as needed for muscle spasms. 60 tablet 1   Multiple Vitamins-Minerals (MULTIVITAMIN WITH MINERALS) tablet Take 2 tablets by mouth daily.     Multiple Vitamins-Minerals (PRESERVISION AREDS 2) CAPS Take 2 capsules by mouth daily at 12 noon.     naproxen sodium (ANAPROX) 220 MG tablet Take 440 mg by mouth daily as needed (pain).     omeprazole (PRILOSEC) 20 MG capsule Take 20 mg by mouth every evening.      Polyethyl Glycol-Propyl Glycol (SYSTANE OP) Place 1 drop into both eyes 3 (three) times daily.     rosuvastatin (CRESTOR) 5 MG tablet Take 5 mg by mouth daily.  2   Liniments (BLUE-EMU SUPER STRENGTH) CREA Apply 1 application topically daily as needed (pain).  (Patient not taking: Reported on 07/07/2021)     No current facility-administered medications for this visit.      Marland Kitchen  PHYSICAL  EXAMINATION: ECOG PERFORMANCE STATUS: 0 - Asymptomatic  Vitals:   07/07/21 1045  BP: 136/79  Pulse: (!) 56  Resp: 20  Temp: 98.1 F (36.7 C)   There were no vitals filed for this visit.   Physical Exam HENT:     Head: Normocephalic and atraumatic.     Mouth/Throat:     Pharynx: No oropharyngeal exudate.  Eyes:     Pupils: Pupils are equal, round, and reactive to light.  Cardiovascular:     Rate and Rhythm: Normal rate and regular rhythm.  Pulmonary:     Effort: No respiratory distress.     Breath sounds: No wheezing.  Abdominal:     General: Bowel sounds are normal. There is no distension.     Palpations: Abdomen is soft. There is no mass.     Tenderness: There is no abdominal tenderness. There is no guarding or rebound.  Musculoskeletal:        General: No tenderness. Normal range of motion.     Cervical back: Normal range of motion and neck supple.  Skin:    General: Skin is warm.  Neurological:     Mental Status: He is alert and oriented to person, place, and time.   Psychiatric:        Mood and Affect: Affect normal.     LABORATORY DATA:  I have reviewed the data as listed Lab Results  Component Value Date   WBC 5.4 07/07/2021   HGB 15.5 07/07/2021   HCT 46.5 07/07/2021   MCV 85.5 07/07/2021   PLT 246 07/07/2021   Recent Labs    11/07/20 0946 07/07/21 1009  NA 140 136  K 4.3 4.3  CL 103 100  CO2 28 28  GLUCOSE 106* 108*  BUN 14 17  CREATININE 0.83 0.73  CALCIUM 9.2 9.1  GFRNONAA >60 >60  PROT  --  7.4  ALBUMIN  --  4.4  AST  --  30  ALT  --  33  ALKPHOS  --  92  BILITOT  --  1.6*     ASSESSMENT & PLAN:   Primary cutaneous marginal zone B-cell lymphoma ([facility]) # Right forearm nodular lesion-Status post punch Biopsy- late April 2017 [[place]]- CUTANEOUS MARGINAL ZONE B CELL LYMPHOMA. S/p RT May 2017.   # Clinically no evidence of recurrence.  STABLE.  Recommend reestablishing with dermatology regarding surveillance.   DISPOSITION:  # follow up in 1 year- MD -cbc/cmp/ldh/MD- Dr.B  Cc; Dr.Hussian; [place]/[facility].      Cammie Sickle, MD 07/07/2021 12:41 PM

## 2021-07-27 DIAGNOSIS — Z20828 Contact with and (suspected) exposure to other viral communicable diseases: Secondary | ICD-10-CM | POA: Diagnosis not present

## 2021-08-19 DIAGNOSIS — H04123 Dry eye syndrome of bilateral lacrimal glands: Secondary | ICD-10-CM | POA: Diagnosis not present

## 2021-09-02 DIAGNOSIS — M1990 Unspecified osteoarthritis, unspecified site: Secondary | ICD-10-CM | POA: Diagnosis not present

## 2021-09-02 DIAGNOSIS — R7303 Prediabetes: Secondary | ICD-10-CM | POA: Diagnosis not present

## 2021-09-02 DIAGNOSIS — C8590 Non-Hodgkin lymphoma, unspecified, unspecified site: Secondary | ICD-10-CM | POA: Diagnosis not present

## 2021-09-02 DIAGNOSIS — G4733 Obstructive sleep apnea (adult) (pediatric): Secondary | ICD-10-CM | POA: Diagnosis not present

## 2021-09-02 DIAGNOSIS — E7800 Pure hypercholesterolemia, unspecified: Secondary | ICD-10-CM | POA: Diagnosis not present

## 2021-09-02 DIAGNOSIS — K219 Gastro-esophageal reflux disease without esophagitis: Secondary | ICD-10-CM | POA: Diagnosis not present

## 2021-09-02 DIAGNOSIS — H04129 Dry eye syndrome of unspecified lacrimal gland: Secondary | ICD-10-CM | POA: Diagnosis not present

## 2021-09-02 DIAGNOSIS — I10 Essential (primary) hypertension: Secondary | ICD-10-CM | POA: Diagnosis not present

## 2021-09-04 DIAGNOSIS — D225 Melanocytic nevi of trunk: Secondary | ICD-10-CM | POA: Diagnosis not present

## 2021-09-04 DIAGNOSIS — L814 Other melanin hyperpigmentation: Secondary | ICD-10-CM | POA: Diagnosis not present

## 2021-09-04 DIAGNOSIS — L819 Disorder of pigmentation, unspecified: Secondary | ICD-10-CM | POA: Diagnosis not present

## 2021-09-04 DIAGNOSIS — L821 Other seborrheic keratosis: Secondary | ICD-10-CM | POA: Diagnosis not present

## 2021-09-04 DIAGNOSIS — B360 Pityriasis versicolor: Secondary | ICD-10-CM | POA: Diagnosis not present

## 2021-11-13 DIAGNOSIS — M25511 Pain in right shoulder: Secondary | ICD-10-CM | POA: Diagnosis not present

## 2021-11-13 DIAGNOSIS — Z96652 Presence of left artificial knee joint: Secondary | ICD-10-CM | POA: Diagnosis not present

## 2021-11-13 DIAGNOSIS — M542 Cervicalgia: Secondary | ICD-10-CM | POA: Diagnosis not present

## 2021-11-13 DIAGNOSIS — M25512 Pain in left shoulder: Secondary | ICD-10-CM | POA: Diagnosis not present

## 2021-11-17 DIAGNOSIS — H04123 Dry eye syndrome of bilateral lacrimal glands: Secondary | ICD-10-CM | POA: Diagnosis not present

## 2021-11-17 DIAGNOSIS — H2513 Age-related nuclear cataract, bilateral: Secondary | ICD-10-CM | POA: Diagnosis not present

## 2021-11-20 DIAGNOSIS — Z20822 Contact with and (suspected) exposure to covid-19: Secondary | ICD-10-CM | POA: Diagnosis not present

## 2021-11-27 DIAGNOSIS — M5030 Other cervical disc degeneration, unspecified cervical region: Secondary | ICD-10-CM | POA: Diagnosis not present

## 2021-12-11 DIAGNOSIS — H2513 Age-related nuclear cataract, bilateral: Secondary | ICD-10-CM | POA: Diagnosis not present

## 2021-12-11 DIAGNOSIS — H04123 Dry eye syndrome of bilateral lacrimal glands: Secondary | ICD-10-CM | POA: Diagnosis not present

## 2021-12-17 DIAGNOSIS — Z20822 Contact with and (suspected) exposure to covid-19: Secondary | ICD-10-CM | POA: Diagnosis not present

## 2021-12-24 ENCOUNTER — Encounter: Payer: Self-pay | Admitting: Orthopedic Surgery

## 2022-01-07 DIAGNOSIS — H25812 Combined forms of age-related cataract, left eye: Secondary | ICD-10-CM | POA: Diagnosis not present

## 2022-01-07 DIAGNOSIS — H2512 Age-related nuclear cataract, left eye: Secondary | ICD-10-CM | POA: Diagnosis not present

## 2022-01-08 DIAGNOSIS — Z20822 Contact with and (suspected) exposure to covid-19: Secondary | ICD-10-CM | POA: Diagnosis not present

## 2022-01-21 DIAGNOSIS — Z20822 Contact with and (suspected) exposure to covid-19: Secondary | ICD-10-CM | POA: Diagnosis not present

## 2022-01-23 DIAGNOSIS — Z20822 Contact with and (suspected) exposure to covid-19: Secondary | ICD-10-CM | POA: Diagnosis not present

## 2022-01-25 DIAGNOSIS — Z20822 Contact with and (suspected) exposure to covid-19: Secondary | ICD-10-CM | POA: Diagnosis not present

## 2022-01-28 DIAGNOSIS — Z20828 Contact with and (suspected) exposure to other viral communicable diseases: Secondary | ICD-10-CM | POA: Diagnosis not present

## 2022-03-16 DIAGNOSIS — H43813 Vitreous degeneration, bilateral: Secondary | ICD-10-CM | POA: Diagnosis not present

## 2022-03-16 DIAGNOSIS — H353132 Nonexudative age-related macular degeneration, bilateral, intermediate dry stage: Secondary | ICD-10-CM | POA: Diagnosis not present

## 2022-03-16 DIAGNOSIS — H35033 Hypertensive retinopathy, bilateral: Secondary | ICD-10-CM | POA: Diagnosis not present

## 2022-03-16 DIAGNOSIS — H348322 Tributary (branch) retinal vein occlusion, left eye, stable: Secondary | ICD-10-CM | POA: Diagnosis not present

## 2022-03-18 DIAGNOSIS — M1990 Unspecified osteoarthritis, unspecified site: Secondary | ICD-10-CM | POA: Diagnosis not present

## 2022-03-18 DIAGNOSIS — Z1331 Encounter for screening for depression: Secondary | ICD-10-CM | POA: Diagnosis not present

## 2022-03-18 DIAGNOSIS — I10 Essential (primary) hypertension: Secondary | ICD-10-CM | POA: Diagnosis not present

## 2022-03-18 DIAGNOSIS — Z0000 Encounter for general adult medical examination without abnormal findings: Secondary | ICD-10-CM | POA: Diagnosis not present

## 2022-03-18 DIAGNOSIS — C8590 Non-Hodgkin lymphoma, unspecified, unspecified site: Secondary | ICD-10-CM | POA: Diagnosis not present

## 2022-03-18 DIAGNOSIS — Z125 Encounter for screening for malignant neoplasm of prostate: Secondary | ICD-10-CM | POA: Diagnosis not present

## 2022-03-18 DIAGNOSIS — M5136 Other intervertebral disc degeneration, lumbar region: Secondary | ICD-10-CM | POA: Diagnosis not present

## 2022-03-18 DIAGNOSIS — E7800 Pure hypercholesterolemia, unspecified: Secondary | ICD-10-CM | POA: Diagnosis not present

## 2022-03-18 DIAGNOSIS — G4733 Obstructive sleep apnea (adult) (pediatric): Secondary | ICD-10-CM | POA: Diagnosis not present

## 2022-03-18 DIAGNOSIS — K219 Gastro-esophageal reflux disease without esophagitis: Secondary | ICD-10-CM | POA: Diagnosis not present

## 2022-03-18 DIAGNOSIS — R7303 Prediabetes: Secondary | ICD-10-CM | POA: Diagnosis not present

## 2022-03-26 DIAGNOSIS — M25511 Pain in right shoulder: Secondary | ICD-10-CM | POA: Diagnosis not present

## 2022-03-26 DIAGNOSIS — M25512 Pain in left shoulder: Secondary | ICD-10-CM | POA: Diagnosis not present

## 2022-04-01 DIAGNOSIS — H2511 Age-related nuclear cataract, right eye: Secondary | ICD-10-CM | POA: Diagnosis not present

## 2022-04-01 DIAGNOSIS — H269 Unspecified cataract: Secondary | ICD-10-CM | POA: Diagnosis not present

## 2022-04-28 DIAGNOSIS — H353132 Nonexudative age-related macular degeneration, bilateral, intermediate dry stage: Secondary | ICD-10-CM | POA: Diagnosis not present

## 2022-04-28 DIAGNOSIS — H43813 Vitreous degeneration, bilateral: Secondary | ICD-10-CM | POA: Diagnosis not present

## 2022-04-28 DIAGNOSIS — H348322 Tributary (branch) retinal vein occlusion, left eye, stable: Secondary | ICD-10-CM | POA: Diagnosis not present

## 2022-04-28 DIAGNOSIS — H35033 Hypertensive retinopathy, bilateral: Secondary | ICD-10-CM | POA: Diagnosis not present

## 2022-07-07 ENCOUNTER — Encounter: Payer: Self-pay | Admitting: Internal Medicine

## 2022-07-07 ENCOUNTER — Inpatient Hospital Stay (HOSPITAL_BASED_OUTPATIENT_CLINIC_OR_DEPARTMENT_OTHER): Payer: Medicare Other | Admitting: Internal Medicine

## 2022-07-07 ENCOUNTER — Inpatient Hospital Stay: Payer: Medicare Other | Attending: Internal Medicine

## 2022-07-07 DIAGNOSIS — C884 Extranodal marginal zone b-cell lymphoma of mucosa-associated lymphoid tissue (malt-lymphoma) not having achieved remission: Secondary | ICD-10-CM

## 2022-07-07 DIAGNOSIS — M25552 Pain in left hip: Secondary | ICD-10-CM | POA: Insufficient documentation

## 2022-07-07 DIAGNOSIS — Z8572 Personal history of non-Hodgkin lymphomas: Secondary | ICD-10-CM | POA: Insufficient documentation

## 2022-07-07 LAB — COMPREHENSIVE METABOLIC PANEL
ALT: 35 U/L (ref 0–44)
AST: 28 U/L (ref 15–41)
Albumin: 4.3 g/dL (ref 3.5–5.0)
Alkaline Phosphatase: 100 U/L (ref 38–126)
Anion gap: 7 (ref 5–15)
BUN: 19 mg/dL (ref 8–23)
CO2: 28 mmol/L (ref 22–32)
Calcium: 9.1 mg/dL (ref 8.9–10.3)
Chloride: 103 mmol/L (ref 98–111)
Creatinine, Ser: 0.91 mg/dL (ref 0.61–1.24)
GFR, Estimated: >60 mL/min (ref >60)
Glucose, Bld: 127 mg/dL — ABNORMAL HIGH (ref 70–99)
Potassium: 4.6 mmol/L (ref 3.5–5.1)
Sodium: 138 mmol/L (ref 135–145)
Total Bilirubin: 1.2 mg/dL (ref 0.3–1.2)
Total Protein: 7 g/dL (ref 6.5–8.1)

## 2022-07-07 LAB — CBC WITH DIFFERENTIAL/PLATELET
Abs Immature Granulocytes: 0.02 10*3/uL (ref 0.00–0.07)
Basophils Absolute: 0 10*3/uL (ref 0.0–0.1)
Basophils Relative: 1 %
Eosinophils Absolute: 0.2 10*3/uL (ref 0.0–0.5)
Eosinophils Relative: 3 %
HCT: 47.5 % (ref 39.0–52.0)
Hemoglobin: 15.9 g/dL (ref 13.0–17.0)
Immature Granulocytes: 0 %
Lymphocytes Relative: 25 %
Lymphs Abs: 1.4 10*3/uL (ref 0.7–4.0)
MCH: 28.5 pg (ref 26.0–34.0)
MCHC: 33.5 g/dL (ref 30.0–36.0)
MCV: 85.3 fL (ref 80.0–100.0)
Monocytes Absolute: 0.6 10*3/uL (ref 0.1–1.0)
Monocytes Relative: 11 %
Neutro Abs: 3.3 10*3/uL (ref 1.7–7.7)
Neutrophils Relative %: 60 %
Platelets: 238 10*3/uL (ref 150–400)
RBC: 5.57 MIL/uL (ref 4.22–5.81)
RDW: 12.7 % (ref 11.5–15.5)
WBC: 5.5 10*3/uL (ref 4.0–10.5)
nRBC: 0 % (ref 0.0–0.2)

## 2022-07-07 LAB — LACTATE DEHYDROGENASE: LDH: 127 U/L (ref 98–192)

## 2022-07-07 NOTE — Progress Notes (Signed)
[facility] [place] NOTE  Patient Care Team: Wenda Low, MD as PCP - General (Internal Medicine) Druscilla Brownie, MD as Consulting Physician (Dermatology)  CHIEF COMPLAINTS/PURPOSE OF CONSULTATION:   Oncology History Overview Note  # CUTANEOUS LYMPHOMA- [face & Right thigh s/p RT 2006; Dr.Gold; [place]]; 2012-Right Fore Arm knot [s/p Bx- May 2015 nodular dense lymphocytic infiltrate s/o Immunocytoma]  # April 2017 CUTANEOUS MARGINAL ZONE B CELL LYMPHOMA. [Dr.Lupton; [place]] RIGHT FOREARM s/p punch Biopsy; s/p RT [facility] 2017. ]  DIAGNOSIS: CUTANEOUS B cell lymphoma  STAGE:     ;GOALS: control  CURRENT/MOST RECENT THERAPY: surveillaince     Primary cutaneous marginal zone B-cell lymphoma ([place])  06/01/2016 Initial Diagnosis   Primary cutaneous marginal zone B-cell lymphoma (HCC)      HISTORY OF PRESENTING ILLNESS: Alone.  Ambulating independently.  Michael Huffman Hight 71 y.o.  male with above history of  cutaneous marginal low-grade lymphoma right forearm at [facility] in April 2017; status post radiation in May 2017.  Patient currently does not follow-up with dermatology.  Otherwise denies any new lumps or bumps.  Complains of left hip pain.   Review of Systems  Constitutional:  Negative for chills, diaphoresis, fever, malaise/fatigue and weight loss.  HENT:  Negative for nosebleeds and sore throat.   Eyes:  Negative for double vision.  Respiratory:  Negative for cough, hemoptysis, sputum production, shortness of breath and wheezing.   Cardiovascular:  Negative for chest pain, palpitations, orthopnea and leg swelling.  Gastrointestinal:  Negative for abdominal pain, blood in stool, constipation, diarrhea, heartburn, melena, nausea and vomiting.  Genitourinary:  Negative for dysuria, frequency and urgency.  Musculoskeletal:  Positive for back pain and joint pain.  Skin: Negative.  Negative for itching and rash.  Neurological:  Negative for dizziness,  tingling, focal weakness, weakness and headaches.  Endo/Heme/Allergies:  Does not bruise/bleed easily.  Psychiatric/Behavioral:  Negative for depression. The patient is not nervous/anxious and does not have insomnia.      MEDICAL HISTORY:  Past Medical History:  Diagnosis Date  . Chronically dry eyes, bilateral   . GERD (gastroesophageal reflux disease)   . Hiatal hernia   . History of colon polyps 01/12/2013   hyperplastic  . History of external beam radiation therapy    face and right thigh , completed 2006 /  right forearm ,  4000 Gy,  completed 06/ 2017  . History of kidney stones   . History of radiation therapy    of lymphoma on skin: face and leg  . Hyperlipidemia   . Hypertension   . 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 OSA on CPAP   . Primary cutaneous lymphoma [facility]) oncologist-  dr Charlaine Dalton (cancer center [place])--- per lov note 10--04-2017 -- no recurrence clinically   dx 2006 -- face and right thigh cutaneous lymphoma s/p radiation therapy (done in [place]);  2012 right forearm knot s/p bx- may 2015 nodular lymphocytic infiltrate s/o immunocytoma;  04/ 2017  cutaneous marginal zone B Cell lymphoma , right forearm (punch bx [id number])  s/p  radiation therapy 06/ 2017  . SSBE (short-segment Barrett's esophagus)     SURGICAL HISTORY: Past Surgical History:  Procedure Laterality Date  . COLONOSCOPY WITH ESOPHAGOGASTRODUODENOSCOPY (EGD)  01/12/2014   in [place]  . CYSTOSCOPY/URETEROSCOPY/HOLMIUM LASER/STENT PLACEMENT Bilateral 08/04/2017   Procedure: CYSTOSCOPY/ RETROGRADE/URETEROSCOPY/HOLMIUM LASER/STENT PLACEMENT;  Surgeon: Ceasar Mons, MD;  Location: [facility];  Service: Urology;  Laterality: Bilateral;  .  ESOPHAGOGASTRODUODENOSCOPY    . EXTRACORPOREAL SHOCK WAVE LITHOTRIPSY  1990s  . HYDROCELE EXCISION Left 2000  approx.  . TONSILLECTOMY  child  . TOTAL KNEE ARTHROPLASTY Right 04/01/2018   Procedure: RIGHT TOTAL KNEE  ARTHROPLASTY;  Surgeon: Netta Cedars, MD;  Location: [facility];  Service: Orthopedics;  Laterality: Right;  . TOTAL KNEE ARTHROPLASTY Left 11/15/2020   Procedure: TOTAL KNEE ARTHROPLASTY;  Surgeon: Netta Cedars, MD;  Location: [facility];  Service: Orthopedics;  Laterality: Left;    SOCIAL HISTORY: moved from [place].  Social History   Socioeconomic History  . Marital status: Married    Spouse name: Not on file  . Number of children: Not on file  . Years of education: Not on file  . Highest education level: Not on file  Occupational History  . Not on file  Tobacco Use  . Smoking status: Former    Packs/day: 2.00    Years: 22.00    Total pack years: 44.00    Types: Cigarettes    Quit date: 08/22/1989    Years since quitting: 32.8  . Smokeless tobacco: Never  Vaping Use  . Vaping Use: Never used  Substance and Sexual Activity  . Alcohol use: Yes    Alcohol/week: 8.0 standard drinks of alcohol    Types: 7 Glasses of wine, 1 Shots of liquor per week    Comment: daily  wine and martini on the weekend.  . Drug use: No  . Sexual activity: Not Currently  Other Topics Concern  . Not on file  Social History Narrative  . Not on file   Social Determinants of Health   Financial Resource Strain: Not on file  Food Insecurity: Not on file  Transportation Needs: Not on file  Physical Activity: Not on file  Stress: Not on file  Social Connections: Not on file  Intimate Partner Violence: Not on file    FAMILY HISTORY: Family History  Problem Relation Age of Onset  . Diabetes Father   . Heart murmur Mother   . Kidney disease Mother   . Aneurysm Mother   . Diabetes Brother     ALLERGIES:  has No Known Allergies.  MEDICATIONS:  Current Outpatient Medications  Medication Sig Dispense Refill  . Fish Oil-Cholecalciferol (OMEGA-3 + VITAMIN D3 PO) Take 2 capsules by mouth 2 (two) times daily.    . hydrochlorothiazide (HYDRODIURIL) 12.5 MG tablet Take 12.5 mg by mouth daily.    Marland Kitchen  losartan (COZAAR) 100 MG tablet Take 100 mg by mouth daily.    . Multiple Vitamins-Minerals (MULTIVITAMIN WITH MINERALS) tablet Take 2 tablets by mouth daily.    . Multiple Vitamins-Minerals (PRESERVISION AREDS 2) CAPS Take 2 capsules by mouth daily at 12 noon.    . naproxen sodium (ANAPROX) 220 MG tablet Take 440 mg by mouth daily as needed (pain).    Marland Kitchen omeprazole (PRILOSEC) 20 MG capsule Take 20 mg by mouth every evening.     Vladimir Faster Glycol-Propyl Glycol (SYSTANE OP) Place 1 drop into both eyes 3 (three) times daily.    . rosuvastatin (CRESTOR) 5 MG tablet Take 5 mg by mouth daily.  2  . Liniments (BLUE-EMU SUPER STRENGTH) CREA Apply 1 application topically daily as needed (pain).  (Patient not taking: Reported on 07/07/2022)    . methocarbamol (ROBAXIN) 500 MG tablet Take 1 tablet (500 mg total) by mouth every 6 (six) hours as needed for muscle spasms. 60 tablet 1   No current facility-administered medications for this visit.      Marland Kitchen  PHYSICAL EXAMINATION: ECOG PERFORMANCE STATUS: 0 - Asymptomatic  Vitals:   07/07/22 1000  BP: 135/78  Pulse: 60  Resp: 16  Temp: 97.8 F (36.6 C)   Filed Weights   07/07/22 1000  Weight: 265 lb (120.2 kg)     Physical Exam HENT:     Head: Normocephalic and atraumatic.     Mouth/Throat:     Pharynx: No oropharyngeal exudate.  Eyes:     Pupils: Pupils are equal, round, and reactive to light.  Cardiovascular:     Rate and Rhythm: Normal rate and regular rhythm.  Pulmonary:     Effort: No respiratory distress.     Breath sounds: No wheezing.  Abdominal:     General: Bowel sounds are normal. There is no distension.     Palpations: Abdomen is soft. There is no mass.     Tenderness: There is no abdominal tenderness. There is no guarding or rebound.  Musculoskeletal:        General: No tenderness. Normal range of motion.     Cervical back: Normal range of motion and neck supple.  Skin:    General: Skin is warm.  Neurological:      Mental Status: He is alert and oriented to person, place, and time.  Psychiatric:        Mood and Affect: Affect normal.     LABORATORY DATA:  I have reviewed the data as listed Lab Results  Component Value Date   WBC 5.5 07/07/2022   HGB 15.9 07/07/2022   HCT 47.5 07/07/2022   MCV 85.3 07/07/2022   PLT 238 07/07/2022   Recent Labs    07/07/22 0957  NA 138  K 4.6  CL 103  CO2 28  GLUCOSE 127*  BUN 19  CREATININE 0.91  CALCIUM 9.1  GFRNONAA >60  PROT 7.0  ALBUMIN 4.3  AST 28  ALT 35  ALKPHOS 100  BILITOT 1.2     ASSESSMENT & PLAN:   Primary cutaneous marginal zone B-cell lymphoma (HCC) # Right forearm nodular lesion-Status post punch Biopsy- late April 2017 [[place]]- CUTANEOUS MARGINAL ZONE B CELL LYMPHOMA. S/p RT May 2017.   # Clinically no evidence of recurrence.  STABLE.  Recommend reestablishing with dermatology regarding surveillance.   DISPOSITION:  # follow up in 1 year- MD -cbc/cmp/ldh/MD- Dr.B  Cc; Dr.Hussian; [place]/[facility].      Cammie Sickle, MD 07/07/2022 10:46 AM

## 2022-07-07 NOTE — Progress Notes (Unsigned)
Patient denies new problems/concerns today.   °

## 2022-07-07 NOTE — Assessment & Plan Note (Addendum)
#   Right forearm nodular lesion-Status post punch Biopsy- late April 2017 [[place]]- CUTANEOUS MARGINAL ZONE B CELL LYMPHOMA. S/p RT May 2017.   # Clinically no evidence of recurrence.  STABLE; continue dermatology regarding surveillance [nov 2023].  # BG 127- fasting  Discussed importance of healthy weight/and weight loss.  Strongly recommend eating more green leafy vegetables and cutting down processed food/ carbohydrates.  Instead increasing whole grains / protein in the diet.  Multiple studies have shown that optimal weight would help improve cardiovascular risk; also shown to cut on the risk of malignancies-colon cancer, breast cancer ovarian/uterine cancer in women and also prostate cancer in men.     DISPOSITION:  # follow up in 1 year- MD -cbc/cmp/ldh/MD- Dr.B  Cc; Dr.Hussian; [place]/[facility].

## 2022-07-09 DIAGNOSIS — Z23 Encounter for immunization: Secondary | ICD-10-CM | POA: Diagnosis not present

## 2022-09-07 DIAGNOSIS — L814 Other melanin hyperpigmentation: Secondary | ICD-10-CM | POA: Diagnosis not present

## 2022-09-07 DIAGNOSIS — L821 Other seborrheic keratosis: Secondary | ICD-10-CM | POA: Diagnosis not present

## 2022-09-07 DIAGNOSIS — L570 Actinic keratosis: Secondary | ICD-10-CM | POA: Diagnosis not present

## 2022-09-07 DIAGNOSIS — D225 Melanocytic nevi of trunk: Secondary | ICD-10-CM | POA: Diagnosis not present

## 2022-09-10 DIAGNOSIS — M25511 Pain in right shoulder: Secondary | ICD-10-CM | POA: Diagnosis not present

## 2022-09-10 DIAGNOSIS — M25512 Pain in left shoulder: Secondary | ICD-10-CM | POA: Diagnosis not present

## 2022-10-01 DIAGNOSIS — K2270 Barrett's esophagus without dysplasia: Secondary | ICD-10-CM | POA: Diagnosis not present

## 2022-10-01 DIAGNOSIS — M5136 Other intervertebral disc degeneration, lumbar region: Secondary | ICD-10-CM | POA: Diagnosis not present

## 2022-10-01 DIAGNOSIS — M1990 Unspecified osteoarthritis, unspecified site: Secondary | ICD-10-CM | POA: Diagnosis not present

## 2022-10-01 DIAGNOSIS — I10 Essential (primary) hypertension: Secondary | ICD-10-CM | POA: Diagnosis not present

## 2022-10-01 DIAGNOSIS — G4733 Obstructive sleep apnea (adult) (pediatric): Secondary | ICD-10-CM | POA: Diagnosis not present

## 2022-10-01 DIAGNOSIS — R7303 Prediabetes: Secondary | ICD-10-CM | POA: Diagnosis not present

## 2022-10-01 DIAGNOSIS — E7800 Pure hypercholesterolemia, unspecified: Secondary | ICD-10-CM | POA: Diagnosis not present

## 2022-10-01 DIAGNOSIS — H9319 Tinnitus, unspecified ear: Secondary | ICD-10-CM | POA: Diagnosis not present

## 2022-10-01 DIAGNOSIS — C8590 Non-Hodgkin lymphoma, unspecified, unspecified site: Secondary | ICD-10-CM | POA: Diagnosis not present

## 2022-10-01 DIAGNOSIS — K635 Polyp of colon: Secondary | ICD-10-CM | POA: Diagnosis not present

## 2022-10-01 DIAGNOSIS — K219 Gastro-esophageal reflux disease without esophagitis: Secondary | ICD-10-CM | POA: Diagnosis not present

## 2022-10-01 DIAGNOSIS — E669 Obesity, unspecified: Secondary | ICD-10-CM | POA: Diagnosis not present

## 2022-10-27 DIAGNOSIS — H353132 Nonexudative age-related macular degeneration, bilateral, intermediate dry stage: Secondary | ICD-10-CM | POA: Diagnosis not present

## 2022-11-10 DIAGNOSIS — K2270 Barrett's esophagus without dysplasia: Secondary | ICD-10-CM | POA: Diagnosis not present

## 2022-11-10 DIAGNOSIS — R1013 Epigastric pain: Secondary | ICD-10-CM | POA: Diagnosis not present

## 2022-11-10 DIAGNOSIS — Z86010 Personal history of colonic polyps: Secondary | ICD-10-CM | POA: Diagnosis not present

## 2022-11-23 DIAGNOSIS — M545 Low back pain, unspecified: Secondary | ICD-10-CM | POA: Diagnosis not present

## 2022-11-23 DIAGNOSIS — G894 Chronic pain syndrome: Secondary | ICD-10-CM | POA: Diagnosis not present

## 2022-11-23 DIAGNOSIS — N390 Urinary tract infection, site not specified: Secondary | ICD-10-CM | POA: Diagnosis not present

## 2022-11-26 DIAGNOSIS — D127 Benign neoplasm of rectosigmoid junction: Secondary | ICD-10-CM | POA: Diagnosis not present

## 2022-11-26 DIAGNOSIS — Z09 Encounter for follow-up examination after completed treatment for conditions other than malignant neoplasm: Secondary | ICD-10-CM | POA: Diagnosis not present

## 2022-11-26 DIAGNOSIS — K449 Diaphragmatic hernia without obstruction or gangrene: Secondary | ICD-10-CM | POA: Diagnosis not present

## 2022-11-26 DIAGNOSIS — D122 Benign neoplasm of ascending colon: Secondary | ICD-10-CM | POA: Diagnosis not present

## 2022-11-26 DIAGNOSIS — K2930 Chronic superficial gastritis without bleeding: Secondary | ICD-10-CM | POA: Diagnosis not present

## 2022-11-26 DIAGNOSIS — K2270 Barrett's esophagus without dysplasia: Secondary | ICD-10-CM | POA: Diagnosis not present

## 2022-11-26 DIAGNOSIS — K5730 Diverticulosis of large intestine without perforation or abscess without bleeding: Secondary | ICD-10-CM | POA: Diagnosis not present

## 2022-11-26 DIAGNOSIS — Z86010 Personal history of colonic polyps: Secondary | ICD-10-CM | POA: Diagnosis not present

## 2022-11-26 DIAGNOSIS — D120 Benign neoplasm of cecum: Secondary | ICD-10-CM | POA: Diagnosis not present

## 2022-11-26 DIAGNOSIS — K648 Other hemorrhoids: Secondary | ICD-10-CM | POA: Diagnosis not present

## 2022-12-03 DIAGNOSIS — K2270 Barrett's esophagus without dysplasia: Secondary | ICD-10-CM | POA: Diagnosis not present

## 2022-12-03 DIAGNOSIS — D122 Benign neoplasm of ascending colon: Secondary | ICD-10-CM | POA: Diagnosis not present

## 2022-12-03 DIAGNOSIS — D127 Benign neoplasm of rectosigmoid junction: Secondary | ICD-10-CM | POA: Diagnosis not present

## 2022-12-03 DIAGNOSIS — D120 Benign neoplasm of cecum: Secondary | ICD-10-CM | POA: Diagnosis not present

## 2022-12-03 DIAGNOSIS — K2930 Chronic superficial gastritis without bleeding: Secondary | ICD-10-CM | POA: Diagnosis not present

## 2022-12-15 DIAGNOSIS — M25512 Pain in left shoulder: Secondary | ICD-10-CM | POA: Diagnosis not present

## 2022-12-15 DIAGNOSIS — M25511 Pain in right shoulder: Secondary | ICD-10-CM | POA: Diagnosis not present

## 2023-03-01 DIAGNOSIS — H35033 Hypertensive retinopathy, bilateral: Secondary | ICD-10-CM | POA: Diagnosis not present

## 2023-03-01 DIAGNOSIS — H43813 Vitreous degeneration, bilateral: Secondary | ICD-10-CM | POA: Diagnosis not present

## 2023-03-01 DIAGNOSIS — H353132 Nonexudative age-related macular degeneration, bilateral, intermediate dry stage: Secondary | ICD-10-CM | POA: Diagnosis not present

## 2023-03-01 DIAGNOSIS — H34832 Tributary (branch) retinal vein occlusion, left eye, with macular edema: Secondary | ICD-10-CM | POA: Diagnosis not present

## 2023-03-19 DIAGNOSIS — C8590 Non-Hodgkin lymphoma, unspecified, unspecified site: Secondary | ICD-10-CM | POA: Diagnosis not present

## 2023-03-19 DIAGNOSIS — Z6835 Body mass index (BMI) 35.0-35.9, adult: Secondary | ICD-10-CM | POA: Diagnosis not present

## 2023-03-19 DIAGNOSIS — Z0000 Encounter for general adult medical examination without abnormal findings: Secondary | ICD-10-CM | POA: Diagnosis not present

## 2023-03-19 DIAGNOSIS — I10 Essential (primary) hypertension: Secondary | ICD-10-CM | POA: Diagnosis not present

## 2023-03-19 DIAGNOSIS — Z23 Encounter for immunization: Secondary | ICD-10-CM | POA: Diagnosis not present

## 2023-03-19 DIAGNOSIS — Z1331 Encounter for screening for depression: Secondary | ICD-10-CM | POA: Diagnosis not present

## 2023-03-19 DIAGNOSIS — K2270 Barrett's esophagus without dysplasia: Secondary | ICD-10-CM | POA: Diagnosis not present

## 2023-03-19 DIAGNOSIS — K219 Gastro-esophageal reflux disease without esophagitis: Secondary | ICD-10-CM | POA: Diagnosis not present

## 2023-03-19 DIAGNOSIS — E7800 Pure hypercholesterolemia, unspecified: Secondary | ICD-10-CM | POA: Diagnosis not present

## 2023-03-19 DIAGNOSIS — M5136 Other intervertebral disc degeneration, lumbar region: Secondary | ICD-10-CM | POA: Diagnosis not present

## 2023-03-19 DIAGNOSIS — Z125 Encounter for screening for malignant neoplasm of prostate: Secondary | ICD-10-CM | POA: Diagnosis not present

## 2023-03-19 DIAGNOSIS — G4733 Obstructive sleep apnea (adult) (pediatric): Secondary | ICD-10-CM | POA: Diagnosis not present

## 2023-03-19 DIAGNOSIS — R7303 Prediabetes: Secondary | ICD-10-CM | POA: Diagnosis not present

## 2023-04-07 DIAGNOSIS — M25511 Pain in right shoulder: Secondary | ICD-10-CM | POA: Diagnosis not present

## 2023-04-07 DIAGNOSIS — M25512 Pain in left shoulder: Secondary | ICD-10-CM | POA: Diagnosis not present

## 2023-07-05 DIAGNOSIS — Z23 Encounter for immunization: Secondary | ICD-10-CM | POA: Diagnosis not present

## 2023-07-08 ENCOUNTER — Ambulatory Visit: Payer: PRIVATE HEALTH INSURANCE | Admitting: Internal Medicine

## 2023-07-08 ENCOUNTER — Inpatient Hospital Stay: Payer: PRIVATE HEALTH INSURANCE | Admitting: Internal Medicine

## 2023-07-08 ENCOUNTER — Other Ambulatory Visit: Payer: PRIVATE HEALTH INSURANCE

## 2023-07-08 ENCOUNTER — Inpatient Hospital Stay: Payer: PRIVATE HEALTH INSURANCE

## 2023-07-08 NOTE — Assessment & Plan Note (Deleted)
#  Right forearm nodular lesion-Status post punch Biopsy- late April 2017 [[place]]- CUTANEOUS MARGINAL ZONE B CELL LYMPHOMA. S/p RT May 2017.   # Clinically no evidence of recurrence.  STABLE; continue dermatology regarding surveillance [nov 2023].  # BG 127- fasting  Discussed importance of healthy weight/and weight loss.  Strongly recommend eating more green leafy vegetables and cutting down processed food/ carbohydrates.  Instead increasing whole grains / protein in the diet.  Multiple studies have shown that optimal weight would help improve cardiovascular risk; also shown to cut on the risk of malignancies-colon cancer, breast cancer ovarian/uterine cancer in women and also prostate cancer in men.     DISPOSITION:  # follow up in 1 year- MD -cbc/cmp/ldh/MD- Dr.B  Cc; Dr.Hussian; [place].

## 2023-07-08 NOTE — Progress Notes (Deleted)
[facility] CONSULT NOTE  Patient Care Team: Georgann Housekeeper, MD as PCP - General (Internal Medicine) Cherlyn Roberts, MD as Consulting Physician (Dermatology)  CHIEF COMPLAINTS/PURPOSE OF CONSULTATION:   Oncology History Overview Note  # CUTANEOUS LYMPHOMA- [face & Right thigh s/p RT 2006; Dr.Gold; [place]]; 2012-Right Fore Arm knot [s/p Bx- May 2015 nodular dense lymphocytic infiltrate s/o Immunocytoma]  # April 2017 CUTANEOUS MARGINAL ZONE B CELL LYMPHOMA. [Dr.Lupton; [place]] RIGHT FOREARM s/p punch Biopsy; s/p RT [facility] 2017. ]  DIAGNOSIS: CUTANEOUS B cell lymphoma  STAGE:     ;GOALS: control  CURRENT/MOST RECENT THERAPY: surveillaince     Primary cutaneous marginal zone B-cell lymphoma  06/01/2016 Initial Diagnosis   Primary cutaneous marginal zone B-cell lymphoma (HCC)      HISTORY OF PRESENTING ILLNESS: Alone.  Ambulating independently.  Michael Huffman 72 y.o.  male with above history of  cutaneous marginal low-grade lymphoma right forearm at [facility] in April 2017; status post radiation in May 2017.  Patient currently awaiting appointment with dermatology.  Otherwise denies any new lumps or bumps.  Complains of left hip pain.  Awaiting reevaluation with orthopedics.  Review of Systems  Constitutional:  Negative for chills, diaphoresis, fever, malaise/fatigue and weight loss.  HENT:  Negative for nosebleeds and sore throat.   Eyes:  Negative for double vision.  Respiratory:  Negative for cough, hemoptysis, sputum production, shortness of breath and wheezing.   Cardiovascular:  Negative for chest pain, palpitations, orthopnea and leg swelling.  Gastrointestinal:  Negative for abdominal pain, blood in stool, constipation, diarrhea, heartburn, melena, nausea and vomiting.  Genitourinary:  Negative for dysuria, frequency and urgency.  Musculoskeletal:  Positive for back pain and joint pain.  Skin: Negative.  Negative for itching and rash.   Neurological:  Negative for dizziness, tingling, focal weakness, weakness and headaches.  Endo/Heme/Allergies:  Does not bruise/bleed easily.  Psychiatric/Behavioral:  Negative for depression. The patient is not nervous/anxious and does not have insomnia.      MEDICAL HISTORY:  Past Medical History:  Diagnosis Date  . Chronically dry eyes, bilateral   . GERD (gastroesophageal reflux disease)   . Hiatal hernia   . History of colon polyps 01/12/2013   hyperplastic  . History of external beam radiation therapy    face and right thigh , completed 2006 /  right forearm ,  4000 Gy,  completed 06/ 2017  . History of kidney stones   . History of radiation therapy    of lymphoma on skin: face and leg  . Hyperlipidemia   . Hypertension   . 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 OSA on CPAP   . Primary cutaneous lymphoma [facility]) oncologist-  dr Louretta Shorten (cancer center [place])--- per lov note 10--04-2017 -- no recurrence clinically   dx 2006 -- face and right thigh cutaneous lymphoma s/p radiation therapy (done in [place]);  2012 right forearm knot s/p bx- may 2015 nodular lymphocytic infiltrate s/o immunocytoma;  04/ 2017  cutaneous marginal zone B Cell lymphoma , right forearm (punch bx [id number])  s/p  radiation therapy 06/ 2017  . SSBE (short-segment Barrett's esophagus)     SURGICAL HISTORY: Past Surgical History:  Procedure Laterality Date  . COLONOSCOPY WITH ESOPHAGOGASTRODUODENOSCOPY (EGD)  01/12/2014   in [place]  . CYSTOSCOPY/URETEROSCOPY/HOLMIUM LASER/STENT PLACEMENT Bilateral 08/04/2017   Procedure: CYSTOSCOPY/ RETROGRADE/URETEROSCOPY/HOLMIUM LASER/STENT PLACEMENT;  Surgeon: Rene Paci, MD;  Location: [facility];  Service: Urology;  Laterality:  Bilateral;  . ESOPHAGOGASTRODUODENOSCOPY    . EXTRACORPOREAL SHOCK WAVE LITHOTRIPSY  1990s  . HYDROCELE EXCISION Left 2000  approx.  . TONSILLECTOMY  child  . TOTAL KNEE ARTHROPLASTY Right  04/01/2018   Procedure: RIGHT TOTAL KNEE ARTHROPLASTY;  Surgeon: Beverely Low, MD;  Location: [facility];  Service: Orthopedics;  Laterality: Right;  . TOTAL KNEE ARTHROPLASTY Left 11/15/2020   Procedure: TOTAL KNEE ARTHROPLASTY;  Surgeon: Beverely Low, MD;  Location: [facility];  Service: Orthopedics;  Laterality: Left;    SOCIAL HISTORY: moved from [place].  Social History   Socioeconomic History  . Marital status: Married    Spouse name: Not on file  . Number of children: Not on file  . Years of education: Not on file  . Highest education level: Not on file  Occupational History  . Not on file  Tobacco Use  . Smoking status: Former    Current packs/day: 0.00    Average packs/day: 2.0 packs/day for 22.0 years (44.0 ttl pk-yrs)    Types: Cigarettes    Start date: 08/23/1967    Quit date: 08/22/1989    Years since quitting: 33.8  . Smokeless tobacco: Never  Vaping Use  . Vaping status: Never Used  Substance and Sexual Activity  . Alcohol use: Yes    Alcohol/week: 8.0 standard drinks of alcohol    Types: 7 Glasses of wine, 1 Shots of liquor per week    Comment: daily  wine and martini on the weekend.  . Drug use: No  . Sexual activity: Not Currently  Other Topics Concern  . Not on file  Social History Narrative  . Not on file   Social Determinants of Health   Financial Resource Strain: Not on file  Food Insecurity: Not on file  Transportation Needs: Not on file  Physical Activity: Not on file  Stress: Not on file  Social Connections: Not on file  Intimate Partner Violence: Not on file    FAMILY HISTORY: Family History  Problem Relation Age of Onset  . Diabetes Father   . Heart murmur Mother   . Kidney disease Mother   . Aneurysm Mother   . Diabetes Brother     ALLERGIES:  has No Known Allergies.  MEDICATIONS:  Current Outpatient Medications  Medication Sig Dispense Refill  . Fish Oil-Cholecalciferol (OMEGA-3 + VITAMIN D3 PO) Take 2 capsules by mouth 2 (two)  times daily.    . hydrochlorothiazide (HYDRODIURIL) 12.5 MG tablet Take 12.5 mg by mouth daily.    . Liniments (BLUE-EMU SUPER STRENGTH) CREA Apply 1 application topically daily as needed (pain).  (Patient not taking: Reported on 07/07/2022)    . losartan (COZAAR) 100 MG tablet Take 100 mg by mouth daily.    . methocarbamol (ROBAXIN) 500 MG tablet Take 1 tablet (500 mg total) by mouth every 6 (six) hours as needed for muscle spasms. 60 tablet 1  . Multiple Vitamins-Minerals (MULTIVITAMIN WITH MINERALS) tablet Take 2 tablets by mouth daily.    . Multiple Vitamins-Minerals (PRESERVISION AREDS 2) CAPS Take 2 capsules by mouth daily at 12 noon.    . naproxen sodium (ANAPROX) 220 MG tablet Take 440 mg by mouth daily as needed (pain).    Marland Kitchen omeprazole (PRILOSEC) 20 MG capsule Take 20 mg by mouth every evening.     Bertram Gala Glycol-Propyl Glycol (SYSTANE OP) Place 1 drop into both eyes 3 (three) times daily.    . rosuvastatin (CRESTOR) 5 MG tablet Take 5 mg by mouth daily.  2   No current facility-administered medications for this visit.      Marland Kitchen  PHYSICAL EXAMINATION: ECOG PERFORMANCE STATUS: 0 - Asymptomatic  There were no vitals filed for this visit.  There were no vitals filed for this visit.    Physical Exam HENT:     Head: Normocephalic and atraumatic.     Mouth/Throat:     Pharynx: No oropharyngeal exudate.  Eyes:     Pupils: Pupils are equal, round, and reactive to light.  Cardiovascular:     Rate and Rhythm: Normal rate and regular rhythm.  Pulmonary:     Effort: No respiratory distress.     Breath sounds: No wheezing.  Abdominal:     General: Bowel sounds are normal. There is no distension.     Palpations: Abdomen is soft. There is no mass.     Tenderness: There is no abdominal tenderness. There is no guarding or rebound.  Musculoskeletal:        General: No tenderness. Normal range of motion.     Cervical back: Normal range of motion and neck supple.  Skin:     General: Skin is warm.  Neurological:     Mental Status: He is alert and oriented to person, place, and time.  Psychiatric:        Mood and Affect: Affect normal.     LABORATORY DATA:  I have reviewed the data as listed Lab Results  Component Value Date   WBC 5.5 07/07/2022   HGB 15.9 07/07/2022   HCT 47.5 07/07/2022   MCV 85.3 07/07/2022   PLT 238 07/07/2022   No results for input(s): "NA", "K", "CL", "CO2", "GLUCOSE", "BUN", "CREATININE", "CALCIUM", "GFRNONAA", "GFRAA", "PROT", "ALBUMIN", "AST", "ALT", "ALKPHOS", "BILITOT", "BILIDIR", "IBILI" in the last 8760 hours.    ASSESSMENT & PLAN:   Primary cutaneous marginal zone B-cell lymphoma (HCC) # Right forearm nodular lesion-Status post punch Biopsy- late April 2017 [[place]]- CUTANEOUS MARGINAL ZONE B CELL LYMPHOMA. S/p RT May 2017.   # Clinically no evidence of recurrence.  STABLE; continue dermatology regarding surveillance [nov 2023].  # BG 127- fasting  Discussed importance of healthy weight/and weight loss.  Strongly recommend eating more green leafy vegetables and cutting down processed food/ carbohydrates.  Instead increasing whole grains / protein in the diet.  Multiple studies have shown that optimal weight would help improve cardiovascular risk; also shown to cut on the risk of malignancies-colon cancer, breast cancer ovarian/uterine cancer in women and also prostate cancer in men.    DISPOSITION:  # follow up in 1 year- MD -cbc/cmp/ldh/MD- Dr.B  Cc; Dr.Hussian; [place].      Earna Coder, MD 07/08/2023 9:15 AM

## 2023-07-09 ENCOUNTER — Other Ambulatory Visit: Payer: Self-pay | Admitting: *Deleted

## 2023-07-09 DIAGNOSIS — C884 Extranodal marginal zone b-cell lymphoma of mucosa-associated lymphoid tissue (malt-lymphoma) not having achieved remission: Secondary | ICD-10-CM

## 2023-07-12 ENCOUNTER — Inpatient Hospital Stay (HOSPITAL_BASED_OUTPATIENT_CLINIC_OR_DEPARTMENT_OTHER): Payer: Medicare Other | Admitting: Nurse Practitioner

## 2023-07-12 ENCOUNTER — Encounter: Payer: Self-pay | Admitting: Nurse Practitioner

## 2023-07-12 ENCOUNTER — Inpatient Hospital Stay: Payer: Medicare Other | Attending: Internal Medicine

## 2023-07-12 ENCOUNTER — Other Ambulatory Visit: Payer: Self-pay

## 2023-07-12 VITALS — BP 140/86 | HR 60 | Temp 97.8°F | Wt 269.0 lb

## 2023-07-12 DIAGNOSIS — Z87891 Personal history of nicotine dependence: Secondary | ICD-10-CM | POA: Diagnosis not present

## 2023-07-12 DIAGNOSIS — E669 Obesity, unspecified: Secondary | ICD-10-CM | POA: Diagnosis not present

## 2023-07-12 DIAGNOSIS — Z8572 Personal history of non-Hodgkin lymphomas: Secondary | ICD-10-CM | POA: Diagnosis not present

## 2023-07-12 DIAGNOSIS — C884 Extranodal marginal zone b-cell lymphoma of mucosa-associated lymphoid tissue (malt-lymphoma) not having achieved remission: Secondary | ICD-10-CM | POA: Diagnosis not present

## 2023-07-12 DIAGNOSIS — Z860100 Personal history of colon polyps, unspecified: Secondary | ICD-10-CM | POA: Diagnosis not present

## 2023-07-12 DIAGNOSIS — L989 Disorder of the skin and subcutaneous tissue, unspecified: Secondary | ICD-10-CM | POA: Insufficient documentation

## 2023-07-12 LAB — CBC WITH DIFFERENTIAL (CANCER CENTER ONLY)
Abs Immature Granulocytes: 0.02 10*3/uL (ref 0.00–0.07)
Basophils Absolute: 0.1 10*3/uL (ref 0.0–0.1)
Basophils Relative: 1 %
Eosinophils Absolute: 0.2 10*3/uL (ref 0.0–0.5)
Eosinophils Relative: 4 %
HCT: 47.2 % (ref 39.0–52.0)
Hemoglobin: 15.6 g/dL (ref 13.0–17.0)
Immature Granulocytes: 0 %
Lymphocytes Relative: 31 %
Lymphs Abs: 1.8 10*3/uL (ref 0.7–4.0)
MCH: 28.6 pg (ref 26.0–34.0)
MCHC: 33.1 g/dL (ref 30.0–36.0)
MCV: 86.6 fL (ref 80.0–100.0)
Monocytes Absolute: 0.7 10*3/uL (ref 0.1–1.0)
Monocytes Relative: 12 %
Neutro Abs: 3.1 10*3/uL (ref 1.7–7.7)
Neutrophils Relative %: 52 %
Platelet Count: 233 10*3/uL (ref 150–400)
RBC: 5.45 MIL/uL (ref 4.22–5.81)
RDW: 13 % (ref 11.5–15.5)
WBC Count: 5.9 10*3/uL (ref 4.0–10.5)
nRBC: 0 % (ref 0.0–0.2)

## 2023-07-12 LAB — CMP (CANCER CENTER ONLY)
ALT: 34 U/L (ref 0–44)
AST: 30 U/L (ref 15–41)
Albumin: 4.4 g/dL (ref 3.5–5.0)
Alkaline Phosphatase: 110 U/L (ref 38–126)
Anion gap: 8 (ref 5–15)
BUN: 13 mg/dL (ref 8–23)
CO2: 28 mmol/L (ref 22–32)
Calcium: 8.9 mg/dL (ref 8.9–10.3)
Chloride: 101 mmol/L (ref 98–111)
Creatinine: 0.66 mg/dL (ref 0.61–1.24)
GFR, Estimated: >60 mL/min (ref >60)
Glucose, Bld: 106 mg/dL — ABNORMAL HIGH (ref 70–99)
Potassium: 4.1 mmol/L (ref 3.5–5.1)
Sodium: 137 mmol/L (ref 135–145)
Total Bilirubin: 1.1 mg/dL (ref 0.3–1.2)
Total Protein: 7 g/dL (ref 6.5–8.1)

## 2023-07-12 LAB — LACTATE DEHYDROGENASE: LDH: 147 U/L (ref 98–192)

## 2023-07-12 NOTE — Progress Notes (Signed)
[facility] CONSULT NOTE  Patient Care Team: Georgann Housekeeper, MD as PCP - General (Internal Medicine) Cherlyn Roberts, MD as Consulting Physician (Dermatology) Earna Coder, MD as Consulting Physician (Oncology)  CHIEF COMPLAINTS/PURPOSE OF CONSULTATION:   Oncology History Overview Note  # CUTANEOUS LYMPHOMA- [face & Right thigh s/p RT 2006; Dr.Gold; [place]]; 2012-Right Fore Arm knot [s/p Bx- May 2015 nodular dense lymphocytic infiltrate s/o Immunocytoma]  # April 2017 CUTANEOUS MARGINAL ZONE B CELL LYMPHOMA. [Dr.Lupton; [place]] RIGHT FOREARM s/p punch Biopsy; s/p RT [facility] 2017. ]  DIAGNOSIS: CUTANEOUS B cell lymphoma  STAGE:     ;GOALS: control  CURRENT/MOST RECENT THERAPY: surveillaince     Primary cutaneous marginal zone B-cell lymphoma  06/01/2016 Initial Diagnosis   Primary cutaneous marginal zone B-cell lymphoma (HCC)     HISTORY OF PRESENTING ILLNESS: Alone. Ambulating independently.   Michael Huffman 72 y.o. male with above history of cutaneous marginal low-grade lymphoma of right forearm at [facility] in April 2017, status post radiation in May 2017. He has been seen in interim by dermatology and has f/u in 1 year. Reports new skin lesion of right neck which has become more prominent in past couple of months. Otherwise feels at baseline and denies complaints.   Review of Systems  Constitutional:  Negative for chills, diaphoresis, fever, malaise/fatigue and weight loss.  HENT:  Negative for nosebleeds and sore throat.   Eyes:  Negative for double vision.  Respiratory:  Negative for cough, hemoptysis, sputum production, shortness of breath and wheezing.   Cardiovascular:  Negative for chest pain, palpitations, orthopnea and leg swelling.  Gastrointestinal:  Negative for abdominal pain, blood in stool, constipation, diarrhea, heartburn, melena, nausea and vomiting.  Genitourinary:  Negative for dysuria, frequency and urgency.  Musculoskeletal:   Positive for back pain. Negative for falls and joint pain.  Skin:  Negative for itching and rash.       Per hpi  Neurological:  Negative for dizziness, tingling, focal weakness, weakness and headaches.  Endo/Heme/Allergies:  Does not bruise/bleed easily.  Psychiatric/Behavioral:  Negative for depression. The patient is not nervous/anxious and does not have insomnia.     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   OSA on CPAP    Primary cutaneous lymphoma [facility]) oncologist-  dr Louretta Shorten (cancer [place])--- per lov note 10--04-2017 -- no recurrence clinically   dx 2006 -- face and right thigh cutaneous lymphoma s/p radiation therapy (done in [place]);  2012 right forearm knot s/p bx- may 2015 nodular lymphocytic infiltrate s/o immunocytoma;  04/ 2017  cutaneous marginal zone B Cell lymphoma , right forearm (punch bx [id number])  s/p  radiation therapy 06/ 2017   SSBE (short-segment Barrett's esophagus)     SURGICAL HISTORY: Past Surgical History:  Procedure Laterality Date   COLONOSCOPY WITH ESOPHAGOGASTRODUODENOSCOPY (EGD)  01/12/2014   in [place]   CYSTOSCOPY/URETEROSCOPY/HOLMIUM LASER/STENT  PLACEMENT Bilateral 08/04/2017   Procedure: CYSTOSCOPY/ RETROGRADE/URETEROSCOPY/HOLMIUM LASER/STENT PLACEMENT;  Surgeon: Rene Paci, MD;  Location: [facility];  Service: Urology;  Laterality: Bilateral;   ESOPHAGOGASTRODUODENOSCOPY     EXTRACORPOREAL SHOCK WAVE LITHOTRIPSY  1990s    HYDROCELE EXCISION Left 2000  approx.   TONSILLECTOMY  child   TOTAL KNEE ARTHROPLASTY Right 04/01/2018   Procedure: RIGHT TOTAL KNEE ARTHROPLASTY;  Surgeon: Beverely Low, MD;  Location: [facility];  Service: Orthopedics;  Laterality: Right;   TOTAL KNEE ARTHROPLASTY Left 11/15/2020   Procedure: TOTAL KNEE ARTHROPLASTY;  Surgeon: Beverely Low, MD;  Location: [facility];  Service: Orthopedics;  Laterality: Left;    SOCIAL HISTORY: moved from [place].  Social History   Socioeconomic History   Marital status: Married    Spouse name: Not on file   Number of children: Not on file   Years of education: Not on file   Highest education level: Not on file  Occupational History   Not on file  Tobacco Use   Smoking status: Former    Current packs/day: 0.00    Average packs/day: 2.0 packs/day for 22.0 years (44.0 ttl pk-yrs)    Types: Cigarettes    Start date: 08/23/1967    Quit date: 08/22/1989    Years since quitting: 33.9   Smokeless tobacco: Never  Vaping Use   Vaping status: Never Used  Substance and Sexual Activity   Alcohol use: Yes    Alcohol/week: 8.0 standard drinks of alcohol    Types: 7 Glasses of wine, 1 Shots of liquor per week    Comment: daily  wine and martini on the weekend.   Drug use: No   Sexual activity: Not Currently  Other Topics Concern   Not on file  Social History Narrative   Not on file   Social Determinants of Health   Financial Resource Strain: Not on file  Food Insecurity: Not on file  Transportation Needs: Not on file  Physical Activity: Not on file  Stress: Not on file  Social Connections: Not on file  Intimate Partner Violence: Not on file    FAMILY HISTORY: Family History  Problem Relation Age of Onset   Diabetes Father    Heart murmur Mother    Kidney disease Mother    Aneurysm Mother    Diabetes Brother     ALLERGIES:  has No Known Allergies.  MEDICATIONS:  Current Outpatient Medications  Medication Sig Dispense Refill   Fish  Oil-Cholecalciferol (OMEGA-3 + VITAMIN D3 PO) Take 2 capsules by mouth 2 (two) times daily.     hydrochlorothiazide (HYDRODIURIL) 12.5 MG tablet Take 12.5 mg by mouth daily.     Liniments (BLUE-EMU SUPER STRENGTH) CREA Apply 1 application topically daily as needed (pain).  (Patient not taking: Reported on 07/07/2022)     losartan (COZAAR) 100 MG tablet Take 100 mg by mouth daily.     methocarbamol (ROBAXIN) 500 MG tablet Take 1 tablet (500 mg total) by mouth every 6 (six) hours as needed for muscle spasms. 60 tablet 1   Multiple Vitamins-Minerals (MULTIVITAMIN WITH MINERALS) tablet Take 2 tablets by mouth daily.     Multiple Vitamins-Minerals (PRESERVISION AREDS 2) CAPS Take 2 capsules by mouth daily at 12 noon.     naproxen sodium (ANAPROX) 220 MG tablet Take 440 mg by mouth daily as needed (pain).     omeprazole (PRILOSEC) 20 MG capsule Take 20 mg by mouth every evening.      Polyethyl Glycol-Propyl Glycol (  SYSTANE OP) Place 1 drop into both eyes 3 (three) times daily.     rosuvastatin (CRESTOR) 5 MG tablet Take 5 mg by mouth daily.  2   No current facility-administered medications for this visit.    PHYSICAL EXAMINATION: ECOG PERFORMANCE STATUS: 0 - Asymptomatic  Vitals:   07/12/23 1455  BP: (!) 140/86  Pulse: 60  Temp: 97.8 F (36.6 C)  SpO2: 98%   Filed Weights   07/12/23 1455  Weight: 269 lb (122 kg)   Physical Exam Vitals reviewed.  Constitutional:      Appearance: He is not ill-appearing.  HENT:     Head: Normocephalic and atraumatic.  Cardiovascular:     Rate and Rhythm: Normal rate and regular rhythm.  Pulmonary:     Effort: No respiratory distress.     Breath sounds: No wheezing.  Abdominal:     General: There is no distension.     Palpations: Abdomen is soft.     Tenderness: There is no abdominal tenderness. There is no guarding.  Musculoskeletal:        General: No tenderness.  Lymphadenopathy:     Cervical: No cervical adenopathy.  Skin:    General:  Skin is warm.     Findings: Lesion present.  Neurological:     Mental Status: He is alert and oriented to person, place, and time.  Psychiatric:        Mood and Affect: Mood and affect normal.        Behavior: Behavior normal.    LABORATORY DATA:  I have reviewed the data as listed Lab Results  Component Value Date   WBC 5.9 07/12/2023   HGB 15.6 07/12/2023   HCT 47.2 07/12/2023   MCV 86.6 07/12/2023   PLT 233 07/12/2023      Latest Ref Rng & Units 07/12/2023    2:44 PM 07/07/2022    9:57 AM 07/07/2021   10:09 AM  CMP  Glucose 70 - 99 mg/dL 540  981  191   BUN 8 - 23 mg/dL 13  19  17    Creatinine 0.61 - 1.24 mg/dL 4.78  2.95  6.21   Sodium 135 - 145 mmol/L 137  138  136   Potassium 3.5 - 5.1 mmol/L 4.1  4.6  4.3   Chloride 98 - 111 mmol/L 101  103  100   CO2 22 - 32 mmol/L 28  28  28    Calcium 8.9 - 10.3 mg/dL 8.9  9.1  9.1   Total Protein 6.5 - 8.1 g/dL 7.0  7.0  7.4   Total Bilirubin 0.3 - 1.2 mg/dL 1.1  1.2  1.6   Alkaline Phos 38 - 126 U/L 110  100  92   AST 15 - 41 U/L 30  28  30    ALT 0 - 44 U/L 34  35  33       ASSESSMENT & PLAN:   # Primary cutaneous marginal zone B-cell lymphoma - Right forearm nodular lesion-Status post punch Biopsy late April 2017 [[place]]- CUTANEOUS MARGINAL ZONE B CELL LYMPHOMA. S/p RT May 2017. Clinically new skin lesion (see below) but otherwise asymptomatic.   # New skin lesion of right neck. Present for < 1 year but has become more raised and prominent recently. Recommend evaluation with dermatology. He is followed by dermatology, [facility] but wishes to transfer his care locally. Referral sent to [facility].    # Obesity- encouraged weight loss and active lifestyle. Increase protein  and fiber to help reduce CV risk as well as risk of malignancies.     DISPOSITION:  Ref to [facility] 1 year- lab (cbc, cmp, ldh), Dr Donneta Romberg- [place]  No problem-specific Assessment & Plan notes found for this  encounter.  Alinda Dooms, NP 07/12/2023

## 2023-09-07 DIAGNOSIS — M25511 Pain in right shoulder: Secondary | ICD-10-CM | POA: Diagnosis not present

## 2023-09-07 DIAGNOSIS — M722 Plantar fascial fibromatosis: Secondary | ICD-10-CM | POA: Diagnosis not present

## 2023-09-07 DIAGNOSIS — M25512 Pain in left shoulder: Secondary | ICD-10-CM | POA: Diagnosis not present

## 2023-09-20 ENCOUNTER — Encounter: Payer: Self-pay | Admitting: Dermatology

## 2023-09-20 ENCOUNTER — Ambulatory Visit: Payer: Medicare Other | Admitting: Dermatology

## 2023-09-20 DIAGNOSIS — D1809 Hemangioma of other sites: Secondary | ICD-10-CM | POA: Diagnosis not present

## 2023-09-20 DIAGNOSIS — D1801 Hemangioma of skin and subcutaneous tissue: Secondary | ICD-10-CM

## 2023-09-20 DIAGNOSIS — D492 Neoplasm of unspecified behavior of bone, soft tissue, and skin: Secondary | ICD-10-CM | POA: Diagnosis not present

## 2023-09-20 DIAGNOSIS — D235 Other benign neoplasm of skin of trunk: Secondary | ICD-10-CM

## 2023-09-20 DIAGNOSIS — D489 Neoplasm of uncertain behavior, unspecified: Secondary | ICD-10-CM

## 2023-09-20 NOTE — Progress Notes (Signed)
New Patient Visit   Subjective  Michael Huffman is a 72 y.o. male who presents for the following:  Patient here today regarding spots he noticed on right neck and right chest. He reports neither spot hurts or has any symptoms but is concerned they may be skin cancer. States he has a lengthy history of skin cancer.  Spot at right neck he noticed over a year ago but was told by another provider just to continue to watch. Patient noticed spot at right chest a less than a year ago.    Patient has history of cutaneous lymphoma of face and right thigh s/p RT 2006. Dr. Doylene Canning: [place]; 2012 Right forearm knot [ s/p bx - May 2015 nodular dense lymphocytic infiltrate s/o immunocytoma]  # April 2017 CUTANEOUS MARGINAL ZONE B CELL LYMPHOMA. [Dr.Lupton; [place]] RIGHT FOREARM s/p punch Biopsy; s/p RT [facility] 2017. ]      The patient has spots, moles and lesions to be evaluated, some may be new or changing and the patient may have concern these could be cancer.   The following portions of the chart were reviewed this encounter and updated as appropriate: medications, allergies, medical history  Review of Systems:  No other skin or systemic complaints except as noted in HPI or Assessment and Plan.  Objective  Well appearing patient in no apparent distress; mood and affect are within normal limits.   A focused examination was performed of the following areas: Right neck, right chest   Relevant exam findings are noted in the Assessment and Plan.  Right Breast 6 mm Pink indurated papule  Right Anterior Neck 8 mm red violaceous papule   Assessment & Plan     NEOPLASM OF UNCERTAIN BEHAVIOR (2) Right Breast Skin / nail biopsy Type of biopsy: tangential   Informed consent: discussed and consent obtained   Timeout: patient name, date of birth, surgical site, and procedure verified   Procedure prep:  Patient was prepped and draped in usual sterile fashion Prep type:  Isopropyl  alcohol Anesthesia: the lesion was anesthetized in a standard fashion   Anesthetic:  1% lidocaine w/ epinephrine 1-100,000 buffered w/ 8.4% NaHCO3 Instrument used: DermaBlade   Hemostasis achieved with: pressure and aluminum chloride   Outcome: patient tolerated procedure well   Post-procedure details: sterile dressing applied and wound care instructions given   Dressing type: bandage and petrolatum   Specimen 2 - Surgical pathology Differential Diagnosis: scar vs dermatofibroma vs BCC  Check Margins: No Right Anterior Neck Skin / nail biopsy Type of biopsy: tangential   Informed consent: discussed and consent obtained   Timeout: patient name, date of birth, surgical site, and procedure verified   Procedure prep:  Patient was prepped and draped in usual sterile fashion Prep type:  Isopropyl alcohol Anesthesia: the lesion was anesthetized in a standard fashion   Anesthetic:  1% lidocaine w/ epinephrine 1-100,000 buffered w/ 8.4% NaHCO3 Instrument used: DermaBlade   Hemostasis achieved with: pressure and aluminum chloride   Outcome: patient tolerated procedure well   Post-procedure details: sterile dressing applied and wound care instructions given   Dressing type: bandage and petrolatum   Specimen 1 - Surgical pathology Differential Diagnosis: lymphoma vs angioma vs merkel cell carcinoma vs other  Check Margins: No  Return if symptoms worsen or fail to improve.  I, Asher Muir, CMA, am acting as scribe for Elie Goody, MD.   Documentation: I have reviewed the above documentation for accuracy and completeness, and I agree  with the above.  Elie Goody, MD

## 2023-09-20 NOTE — Patient Instructions (Addendum)
Biopsy Wound Care Instructions  Leave the original bandage on for 24 hours if possible.  If the bandage becomes soaked or soiled before that time, it is OK to remove it and examine the wound.  A small amount of post-operative bleeding is normal.  If excessive bleeding occurs, remove the bandage, place gauze over the site and apply continuous pressure (no peeking) over the area for 30 minutes. If this does not work, please call our clinic as soon as possible or page your doctor if it is after hours.   Once a day, cleanse the wound with soap and water. It is fine to shower. If a thick crust develops you may use a Q-tip dipped into dilute hydrogen peroxide (mix 1:1 with water) to dissolve it.  Hydrogen peroxide can slow the healing process, so use it only as needed.    After washing, apply petroleum jelly (Vaseline) or an antibiotic ointment if your doctor prescribed one for you, followed by a bandage.    For best healing, the wound should be covered with a layer of ointment at all times. If you are not able to keep the area covered with a bandage to hold the ointment in place, this may mean re-applying the ointment several times a day.  Continue this wound care until the wound has healed and is no longer open.   Itching and mild discomfort is normal during the healing process. However, if you develop pain or severe itching, please call our office.   If you have any discomfort, you can take Tylenol (acetaminophen) or ibuprofen as directed on the bottle. (Please do not take these if you have an allergy to them or cannot take them for another reason).  Some redness, tenderness and white or yellow material in the wound is normal healing.  If the area becomes very sore and red, or develops a thick yellow-green material (pus), it may be infected; please notify us.    If you have stitches, return to clinic as directed to have the stitches removed. You will continue wound care for 2-3 days after the stitches  are removed.   Wound healing continues for up to one year following surgery. It is not unusual to experience pain in the scar from time to time during the interval.  If the pain becomes severe or the scar thickens, you should notify the office.    A slight amount of redness in a scar is expected for the first six months.  After six months, the redness will fade and the scar will soften and fade.  The color difference becomes less noticeable with time.  If there are any problems, return for a post-op surgery check at your earliest convenience.  To improve the appearance of the scar, you can use silicone scar gel, cream, or sheets (such as Mederma or Serica) every night for up to one year. These are available over the counter (without a prescription).  Please call our office at [phone] for any questions or concerns.      Due to recent changes in healthcare laws, you may see results of your pathology and/or laboratory studies on MyChart before the doctors have had a chance to review them. We understand that in some cases there may be results that are confusing or concerning to you. Please understand that not all results are received at the same time and often the doctors may need to interpret multiple results in order to provide you with the best plan of care or  course of treatment. Therefore, we ask that you please give [place] 2 business days to thoroughly review all your results before contacting the office for clarification. Should we see a critical lab result, you will be contacted sooner.   If You Need Anything After Your Visit  If you have any questions or concerns for your doctor, please call our main line at [phone] and press option 4 to reach your doctor's medical assistant. If no one answers, please leave a voicemail as directed and we will return your call as soon as possible. Messages left after 4 pm will be answered the following business day.   You may also send [place] a message  via MyChart. We typically respond to MyChart messages within 1-2 business days.  For prescription refills, please ask your pharmacy to contact our office. Our fax number is [phone].  If you have an urgent issue when the clinic is closed that cannot wait until the next business day, you can page your doctor at the number below.    Please note that while we do our best to be available for urgent issues outside of office hours, we are not available 24/7.   If you have an urgent issue and are unable to reach [place], you may choose to seek medical care at your doctor's office, retail clinic, urgent care center, or emergency room.  If you have a medical emergency, please immediately call 911 or go to the emergency department.  Pager Numbers  - Dr. Gwen Pounds: [phone]  - Dr. Roseanne Reno: [phone]  - Dr. Katrinka Blazing: [phone]   In the event of inclement weather, please call our main line at [phone] for an update on the status of any delays or closures.  Dermatology Medication Tips: Please keep the boxes that topical medications come in in order to help keep track of the instructions about where and how to use these. Pharmacies typically print the medication instructions only on the boxes and not directly on the medication tubes.   If your medication is too expensive, please contact our office at [phone] option 4 or send [place] a message through MyChart.   We are unable to tell what your co-pay for medications will be in advance as this is different depending on your insurance coverage. However, we may be able to find a substitute medication at lower cost or fill out paperwork to get insurance to cover a needed medication.   If a prior authorization is required to get your medication covered by your insurance company, please allow [place] 1-2 business days to complete this process.  Drug prices often vary depending on where the prescription is filled and some pharmacies may offer cheaper  prices.  The website www.goodrx.com contains coupons for medications through different pharmacies. The prices here do not account for what the cost may be with help from insurance (it may be cheaper with your insurance), but the website can give you the price if you did not use any insurance.  - You can print the associated coupon and take it with your prescription to the pharmacy.  - You may also stop by our office during regular business hours and pick up a GoodRx coupon card.  - If you need your prescription sent electronically to a different pharmacy, notify our office through [facility] or by phone at [phone] option 4.     Si Usted Necesita Algo Despus de Su Visita  Tambin puede enviarnos un mensaje a travs de Clinical cytogeneticist. Por lo general respondemos  a los mensajes de MyChart en el transcurso de 1 a 2 das hbiles.  Para renovar recetas, por favor pida a su farmacia que se ponga en contacto con nuestra oficina. Annie Sable de fax es [place] [phone].  Si tiene un asunto urgente cuando la clnica est cerrada y que no puede esperar hasta el siguiente da hbil, puede llamar/localizar a su doctor(a) al nmero que aparece a continuacin.   Por favor, tenga en cuenta que aunque hacemos todo lo posible para estar disponibles para asuntos urgentes fuera del horario de [place], no estamos disponibles las 24 horas del da, los 7 [address]  [address] de la [place].   Si tiene un problema urgente y no puede comunicarse con nosotros, puede optar por buscar atencin mdica  en el consultorio de su doctor(a), en una clnica privada, en un centro de atencin urgente o en una sala de emergencias.  Si tiene Engineer, drilling, por favor llame inmediatamente al 911 o vaya a la sala de emergencias.  Nmeros de bper  - Dr. Gwen Pounds: [phone]  - Dra. Roseanne Reno: [phone]  - Dr. Katrinka Blazing: [phone]   En caso de inclemencias del tiempo, por favor llame a Lacy Duverney principal al [phone]  para una actualizacin sobre el [place] de cualquier retraso o cierre.  Consejos para la medicacin en dermatologa: Por favor, guarde las cajas en las que vienen los medicamentos de uso tpico para ayudarle a seguir las instrucciones sobre dnde y cmo usarlos. Las farmacias generalmente imprimen las instrucciones del medicamento slo en las cajas y no directamente en los tubos del [place].   Si su medicamento es muy caro, por favor, pngase en contacto con Rolm [place]ala llamando al [phone] y presione la opcin 4 o envenos un mensaje a travs de Clinical cytogeneticist.   No podemos decirle cul ser su copago por los medicamentos por adelantado ya que esto es diferente dependiendo de la cobertura de su seguro. Sin embargo, es posible que podamos encontrar un medicamento sustituto a Audiological scientist un formulario para que el seguro cubra el medicamento que se considera necesario.   Si se requiere una autorizacin previa para que su compaa de seguros [place] su medicamento, por favor permtanos de 1 a 2 das hbiles para completar [address]  Los precios de los medicamentos varan con frecuencia dependiendo del Environmental consultant de dnde se surte la receta y alguna farmacias pueden ofrecer precios ms baratos.  El sitio web www.goodrx.com tiene cupones para medicamentos de Health and safety inspector. Los precios aqu no tienen en cuenta lo que podra costar con la ayuda del seguro (puede ser ms barato con su seguro), pero el sitio web puede darle el precio si no utiliz Tourist information centre manager.  - Puede imprimir el cupn correspondiente y llevarlo con su receta a la farmacia.  - Tambin puede pasar por nuestra oficina durante el horario de atencin regular y Education officer, museum una tarjeta de cupones de GoodRx.  - Si necesita que su receta se enve electrnicamente a una farmacia diferente, informe a nuestra oficina a travs de MyChart de [place] o por telfono llamando al [phone] y presione la opcin 4.

## 2023-09-23 LAB — SURGICAL PATHOLOGY

## 2023-09-24 ENCOUNTER — Ambulatory Visit (INDEPENDENT_AMBULATORY_CARE_PROVIDER_SITE_OTHER): Payer: Medicare Other

## 2023-09-24 ENCOUNTER — Ambulatory Visit (HOSPITAL_COMMUNITY): Payer: Medicare Other

## 2023-09-24 ENCOUNTER — Ambulatory Visit (HOSPITAL_COMMUNITY)
Admission: EM | Admit: 2023-09-24 | Discharge: 2023-09-24 | Disposition: A | Discharge status: Home / Self Care | Payer: Medicare Other | Attending: Emergency Medicine | Admitting: Emergency Medicine

## 2023-09-24 ENCOUNTER — Encounter (HOSPITAL_COMMUNITY): Payer: Self-pay

## 2023-09-24 DIAGNOSIS — R059 Cough, unspecified: Secondary | ICD-10-CM | POA: Diagnosis not present

## 2023-09-24 DIAGNOSIS — K449 Diaphragmatic hernia without obstruction or gangrene: Secondary | ICD-10-CM | POA: Diagnosis not present

## 2023-09-24 MED ORDER — AZITHROMYCIN 250 MG PO TABS: 250.0000 mg | ORAL_TABLET | Freq: Every day | ORAL | 0 refills | Status: DC

## 2023-09-24 MED ORDER — BENZONATATE 100 MG PO CAPS: 100.0000 mg | ORAL_CAPSULE | Freq: Three times a day (TID) | ORAL | 0 refills | Status: DC

## 2023-09-24 NOTE — ED Provider Notes (Addendum)
 [facility]    CSN: [id number] Arrival date & time: 09/24/23  1223      History   Chief Complaint Chief Complaint  Patient presents with   Cough    HPI Michael Huffman is a 73 y.o. male.   Patient presents to clinic for complaint of an ongoing dry cough for the past month. Initially symptoms started with hot and cold chills a month ago, these have since resolved.   Last night he was coughing and developed a sore throat. Chills last night. No fevers. Some nasal drainage.   Taking Nyquil cough drops. Recent sick contacts around Christmas.   Oxygenation 96% RA at rest.     The history is provided by the patient and medical records.  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-  dr cindy joe (cancer center [place])--- per lov note 10--04-2017 -- no recurrence clinically   dx 2006 -- face and right thigh cutaneous lymphoma s/p radiation therapy (done in [place] );  2012 right forearm knot s/p bx- may 2015 nodular lymphocytic infiltrate s/o immunocytoma;  04/ 2017  cutaneous marginal zone B Cell lymphoma , right forearm (punch bx [id number])  s/p  radiation therapy 06/ 2017   SSBE (short-segment Barrett's esophagus)     Patient Active Problem List   Diagnosis Date Noted   Scoliosis of lumbar spine 01/08/2021   Status post total knee replacement, 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   Degeneration of lumbar intervertebral disc 10/05/2019   Status post total knee  replacement, right 04/01/2018   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   TONSILLECTOMY  child   TOTAL KNEE ARTHROPLASTY Right 04/01/2018   Procedure: RIGHT TOTAL KNEE ARTHROPLASTY;  Surgeon: Kay Kemps, MD;  Location: [facility];  Service: Orthopedics;  Laterality: Right;   TOTAL KNEE ARTHROPLASTY Left 11/15/2020   Procedure: TOTAL KNEE ARTHROPLASTY;  Surgeon: Kay Kemps, MD;  Location: [facility];  Service: Orthopedics;  Laterality: Left;       Home Medications    Prior to Admission medications   Medication Sig Start Date End Date Taking? Authorizing Provider  azithromycin  (ZITHROMAX ) 250 MG tablet Take 1 tablet (250 mg total) by mouth daily. Take first 2 tablets together, then 1 every day until finished. 09/24/23  Yes Ledon Weihe  N, FNP  benzonatate  (TESSALON ) 100 MG capsule Take 1 capsule (100 mg total) by mouth every 8 (eight) hours.  09/24/23  Yes Erikah Thumm  N, FNP  Fish Oil-Cholecalciferol  (OMEGA-3 + VITAMIN D3 PO) Take 2 capsules by mouth 2 (two) times daily.    [provider]  hydrochlorothiazide  (HYDRODIURIL ) 12.5 MG tablet Take 12.5 mg by mouth daily.    [provider]  Liniments (BLUE-EMU SUPER STRENGTH) CREA Apply 1 application topically daily as needed (pain).  Patient not taking: Reported on 07/07/2022    [provider]  losartan  (COZAAR ) 100 MG tablet Take 100 mg by mouth daily.    [provider]  Multiple Vitamins-Minerals (PRESERVISION AREDS 2) CAPS Take 2 capsules by mouth daily at 12 noon.    [provider]  naproxen  sodium (ANAPROX ) 220 MG tablet Take 440 mg by mouth daily as needed (pain).    [provider]  omeprazole (PRILOSEC) 20 MG capsule Take 40 mg by mouth every evening.    [provider]  Polyethyl Glycol-Propyl Glycol (SYSTANE OP) Place 1 drop into both eyes 3 (three) times daily.    [provider]  rosuvastatin  (CRESTOR ) 5 MG tablet Take 5 mg by mouth daily. 06/07/17   [provider]    Family History Family History  Problem Relation Age of Onset   Diabetes Father    Heart murmur Mother    Kidney disease Mother    Aneurysm Mother    Diabetes Brother     Social History Social History   Tobacco Use   Smoking status: Former    Current packs/day: 0.00    Average packs/day: 2.0 packs/day for 22.0 years (44.0 ttl pk-yrs)    Types: Cigarettes    Start date: 08/23/1967    Quit date: 08/22/1989    Years since quitting: 34.1   Smokeless tobacco: Never  Vaping Use   Vaping status: Never Used  Substance Use Topics   Alcohol  use: Yes    Alcohol /week: 8.0 standard drinks of alcohol     Types: 7 Glasses of wine, 1 Shots of liquor per week    Comment: daily  wine and martini on the weekend.   Drug use: No     Allergies   Patient has no known allergies.   Review of Systems Review of Systems  Per HPI   Physical Exam Triage Vital Signs ED Triage Vitals  Encounter Vitals Group     BP 09/24/23 1354 (!) 141/84     Systolic BP Percentile --      Diastolic BP Percentile --      Pulse Rate 09/24/23 1354 62     Resp 09/24/23 1354 16     Temp 09/24/23 1354 98.4 F (36.9 C)     Temp Source 09/24/23 1354 Oral     SpO2 09/24/23 1354 93 %     Weight 09/24/23 1354 265 lb (120.2 kg)     Height 09/24/23 1354 6' 2 (1.88 m)     Head Circumference --      Peak Flow --      Pain Score 09/24/23 1352 8      Pain Loc --      Pain Education --      Exclude from Growth Chart --    No data found.  Updated Vital Signs BP (!) 141/84 (BP Location: Right Arm)   Pulse 62   Temp 98.4 F (36.9 C) (Oral)   Resp 16   Ht 6' 2 (1.88 m)   Wt 265 lb (120.2 kg)   SpO2 93%   BMI 34.02 kg/m  Visual Acuity Right Eye Distance:   Left Eye Distance:   Bilateral Distance:    Right Eye Near:   Left Eye Near:    Bilateral Near:     Physical Exam Vitals and nursing note reviewed.  Constitutional:      Appearance: Normal appearance.  HENT:     Head: Normocephalic and atraumatic.     Right Ear: External ear normal.     Left Ear: External ear normal.     Nose: Nose normal.     Mouth/Throat:     Mouth: Mucous membranes are moist.  Eyes:     Conjunctiva/sclera: Conjunctivae normal.  Cardiovascular:     Rate and Rhythm: Normal rate and regular rhythm.     Heart sounds: Normal heart sounds. No murmur heard. Pulmonary:     Effort: Pulmonary effort is normal. No respiratory distress.     Breath sounds: Normal breath sounds.  Musculoskeletal:        General: Normal range of motion.  Skin:    General: Skin is warm and dry.  Neurological:     General: No focal deficit present.     Mental Status: He is alert and oriented to person, place, and time.  Psychiatric:        Mood and Affect: Mood normal.        Behavior: Behavior normal.      UC Treatments / Results  Labs (all labs ordered are listed, but only abnormal results are displayed) Labs Reviewed - No data to display  EKG   Radiology DG Chest 2 View Result Date: 09/24/2023 CLINICAL DATA:  Cough for 1 month EXAM: CHEST - 2 VIEW COMPARISON:  CT abdomen and pelvis 05/11/2017. FINDINGS: The heart size and mediastinal contours are within normal limits. Moderate-sized hiatal hernia is present. Both lungs are clear. The visualized skeletal structures are unremarkable. IMPRESSION: 1. No active cardiopulmonary disease. 2. Moderate-sized  hiatal hernia. Electronically Signed   By: Greig Pique M.D.   On: 09/24/2023 16:33    Procedures Procedures (including critical care time)  Medications Ordered in UC Medications - No data to display  Initial Impression / Assessment and Plan / UC Course  I have reviewed the triage vital signs and the nursing notes.  Pertinent labs & imaging results that were available during my care of the patient were reviewed by me and considered in my medical decision making (see chart for details).  Vitals and triage reviewed, patient is hemodynamically stable.  Lungs are vesicular, heart with regular rate and rhythm.  Oxygenation 96% on room air, able to speak in full sentences no acute distress.  No shortness of breath or wheezing.  Chest x-ray awaiting official radiology overread, some suspicion for a left lower lobe infiltrate, will cover with azithromycin  due to suspicion and prolonged duration of symptoms.  Cough management reviewed.  Plan of care, follow-up care return precautions given, no questions at this time.  Official radiology interpretation does not show any acute cardiopulmonary changes, does have a hiatal hernia.  Continue with treatment plan as discussed.     Final Clinical Impressions(s) / UC Diagnoses   Final diagnoses:  Cough, unspecified type     Discharge Instructions      Your x-ray was suspicious for infection, take the antibiotics as prescribed and with food.  Can use the Tessalon  Perles every 8 hours to help with cough suppression.  For sore throat you can do warm saline gargles and tea with honey.   Please follow-up  with your primary care or return to clinic if no improvement in symptoms despite these interventions, you develop any new concerning symptoms.      ED Prescriptions     Medication Sig Dispense Auth. Provider   azithromycin  (ZITHROMAX ) 250 MG tablet Take 1 tablet (250 mg total) by mouth daily. Take first 2 tablets together, then 1 every day until  finished. 6 tablet Dreama, Makeila Yamaguchi  N, FNP   benzonatate  (TESSALON ) 100 MG capsule Take 1 capsule (100 mg total) by mouth every 8 (eight) hours. 21 capsule Dreama, Rylie Knierim  N, FNP      PDMP not reviewed this encounter.   Dreama, Captain Blucher  N, FNP 09/24/23 1509    Dreama Tanja SAILOR, FNP 09/24/23 1642

## 2023-09-24 NOTE — ED Triage Notes (Signed)
 Patient here today with c/o a cough for a month now. He started having a ST when he coughs last night. Patient is concerned with Strep. He is also having nasal drainage. He has been taking Nyquil and cough drops with some relief. Daughter and son in law were sick at Christmas time.

## 2023-09-24 NOTE — Discharge Instructions (Addendum)
 Your x-ray was suspicious for infection, take the antibiotics as prescribed and with food.  Can use the Tessalon  Perles every 8 hours to help with cough suppression.  For sore throat you can do warm saline gargles and tea with honey.   Please follow-up with your primary care or return to clinic if no improvement in symptoms despite these interventions, you develop any new concerning symptoms.

## 2023-09-27 ENCOUNTER — Telehealth: Payer: Self-pay

## 2023-09-27 NOTE — Telephone Encounter (Signed)
-----   Message from [facility] sent at 09/24/2023  2:42 PM EST ----- Diagnosis: 1. Skin, right anterior neck :       HEMANGIOMA        2. Skin, right breast :       DERMATOFIBROMA    Plan: please call to share that biopsy from neck shows benign overgrowth of blood vessels and biopsy from right chest shows benign scar tissue. No treatment needed for either location. No skin cancer and no evidence of lymphoma.

## 2023-09-28 ENCOUNTER — Telehealth: Payer: Self-pay

## 2023-09-28 NOTE — Telephone Encounter (Signed)
 Discussed biopsy results with patient

## 2023-09-30 DIAGNOSIS — G4733 Obstructive sleep apnea (adult) (pediatric): Secondary | ICD-10-CM | POA: Diagnosis not present

## 2023-09-30 DIAGNOSIS — R7303 Prediabetes: Secondary | ICD-10-CM | POA: Diagnosis not present

## 2023-09-30 DIAGNOSIS — Z6835 Body mass index (BMI) 35.0-35.9, adult: Secondary | ICD-10-CM | POA: Diagnosis not present

## 2023-09-30 DIAGNOSIS — E7800 Pure hypercholesterolemia, unspecified: Secondary | ICD-10-CM | POA: Diagnosis not present

## 2023-09-30 DIAGNOSIS — I10 Essential (primary) hypertension: Secondary | ICD-10-CM | POA: Diagnosis not present

## 2023-09-30 DIAGNOSIS — K2270 Barrett's esophagus without dysplasia: Secondary | ICD-10-CM | POA: Diagnosis not present

## 2023-09-30 DIAGNOSIS — C8590 Non-Hodgkin lymphoma, unspecified, unspecified site: Secondary | ICD-10-CM | POA: Diagnosis not present

## 2023-10-18 DIAGNOSIS — H43813 Vitreous degeneration, bilateral: Secondary | ICD-10-CM | POA: Diagnosis not present

## 2023-10-18 DIAGNOSIS — H353132 Nonexudative age-related macular degeneration, bilateral, intermediate dry stage: Secondary | ICD-10-CM | POA: Diagnosis not present

## 2023-10-18 DIAGNOSIS — H35033 Hypertensive retinopathy, bilateral: Secondary | ICD-10-CM | POA: Diagnosis not present

## 2023-10-18 DIAGNOSIS — H34832 Tributary (branch) retinal vein occlusion, left eye, with macular edema: Secondary | ICD-10-CM | POA: Diagnosis not present

## 2023-11-12 DIAGNOSIS — H524 Presbyopia: Secondary | ICD-10-CM | POA: Diagnosis not present

## 2023-11-12 DIAGNOSIS — Z961 Presence of intraocular lens: Secondary | ICD-10-CM | POA: Diagnosis not present

## 2023-11-12 DIAGNOSIS — H353132 Nonexudative age-related macular degeneration, bilateral, intermediate dry stage: Secondary | ICD-10-CM | POA: Diagnosis not present

## 2023-12-01 ENCOUNTER — Ambulatory Visit (INDEPENDENT_AMBULATORY_CARE_PROVIDER_SITE_OTHER): Admitting: Podiatry

## 2023-12-01 ENCOUNTER — Ambulatory Visit (INDEPENDENT_AMBULATORY_CARE_PROVIDER_SITE_OTHER)

## 2023-12-01 DIAGNOSIS — M79672 Pain in left foot: Secondary | ICD-10-CM | POA: Diagnosis not present

## 2023-12-01 DIAGNOSIS — M722 Plantar fascial fibromatosis: Secondary | ICD-10-CM

## 2023-12-01 MED ORDER — TRIAMCINOLONE ACETONIDE 10 MG/ML IJ SUSP
2.5000 mg | Freq: Once | INTRAMUSCULAR | Status: AC
Start: 2023-12-01 — End: ?

## 2023-12-01 MED ORDER — MELOXICAM 15 MG PO TABS: 15.0000 mg | ORAL_TABLET | Freq: Every day | ORAL | 0 refills | Status: DC

## 2023-12-01 MED ORDER — DEXAMETHASONE SODIUM PHOSPHATE 120 MG/30ML IJ SOLN: 4.0000 mg | Freq: Once | INTRAMUSCULAR | Status: AC

## 2023-12-01 NOTE — Patient Instructions (Signed)

## 2023-12-01 NOTE — Progress Notes (Signed)
 Subjective:  Patient ID: Michael Huffman, male    DOB: [birth year],   MRN: [id number]  Chief Complaint  Patient presents with   Foot Pain    Pt presents for left heel pain    73 y.o. male presents for concern of left heel pain that has been ongoing for a while. He does relate a history of 'bone spurs' and had them treated and pain slightly different that this. Relates mostly pain after being on his fee for a while in his heel and in his arch mostly the outside of the arch. Has tried some stretching and some icing to the area which helps temporarily.   . Denies any other pedal complaints. Denies n/v/f/c.   Past Medical History:  Diagnosis Date   Chronically dry eyes, bilateral    GERD (gastroesophageal reflux disease)    Hiatal hernia    History of colon polyps 01/12/2013   hyperplastic   History of external beam radiation therapy    face and right thigh , completed 2006 /  right forearm ,  4000 Gy,  completed 06/ 2017   History of kidney stones    History of radiation therapy    of lymphoma on skin: face and leg   Hyperlipidemia    Hypertension    Macular degeneration    left > right   OSA on CPAP    Primary cutaneous lymphoma [facility]) oncologist-  dr Louretta Shorten (cancer center [place])--- per lov note 10--04-2017 -- no recurrence clinically   dx 2006 -- face and right thigh cutaneous lymphoma s/p radiation therapy (done in [place]);  2012 right forearm knot s/p bx- may 2015 nodular lymphocytic infiltrate s/o immunocytoma;  04/ 2017  cutaneous marginal zone B Cell lymphoma , right forearm (punch bx [id number])  s/p  radiation therapy 06/ 2017   SSBE (short-segment Barrett's esophagus)     Objective:  Physical Exam: Vascular: DP/PT pulses 2/4 bilateral. CFT <3 seconds. Normal hair growth on digits. No edema.  Skin. No lacerations or abrasions bilateral feet.  Musculoskeletal: MMT 5/5 bilateral lower extremities in DF, PF, Inversion and Eversion. Deceased ROM in DF of ankle  joint. Tender to the lateral calcaneal tubercle left Some tenderness around lateral arch . No pain with achilles, PT . No pain with calcaneal squeeze.  Neurological: Sensation intact to light touch.   Assessment:   1. Plantar fasciitis, left      Plan:  Patient was evaluated and treated and all questions answered.  Discussed plantar fasciitis with patient.  X-rays reviewed and discussed with patient. No acute fractures or dislocations noted. Mild spurring noted at inferior calcaneus.  Discussed treatment options including, ice, NSAIDS, supportive shoes, bracing, and stretching. Stretching exercises provided to be done on a daily basis.   Prescription for meloxicam provided and sent to pharmacy. CR an BUN wnl.  Patient requesting injection today. Procedure note below.   PF brace dispenesed.  Follow-up 6 weeks or sooner if any problems arise. In the meantime, encouraged to call the office with any questions, concerns, change in symptoms.   Procedure:  Discussed etiology, pathology, conservative vs. surgical therapies. At this time a plantar fascial injection was recommended.  The patient agreed and a sterile skin prep was applied.  An injection consisting of  1cc dexamethasone 0.5 cc kenalog and 1cc marcaine mixture was infiltrated at the point of maximal tenderness on the left Heel.  Bandaid applied. The patient tolerated this well and was given instructions for aftercare.  Louann Sjogren, DPM

## 2024-01-12 ENCOUNTER — Ambulatory Visit (INDEPENDENT_AMBULATORY_CARE_PROVIDER_SITE_OTHER): Admitting: Podiatry

## 2024-01-12 DIAGNOSIS — M722 Plantar fascial fibromatosis: Secondary | ICD-10-CM | POA: Diagnosis not present

## 2024-01-12 NOTE — Progress Notes (Signed)
  Subjective:  Patient ID: Michael Huffman, male    DOB: Dec 31, 1950,   MRN: [id number]  Chief Complaint  Patient presents with   Plantar Fasciitis    Pt presents for a follow up of pt of left foot states he is doing better.    73 y.o. male presents for follow-up of left plantar fasciiits relate doing better. Not sure how much injection helped but did relate he was coming out of bus and felt a snap and had bad pain for a couple days then the pain went away completely and is about 95% better. Some pain still on the outside of the heel  . Denies any other pedal complaints. Denies n/v/f/c.   Past Medical History:  Diagnosis Date   Chronically dry eyes, bilateral    GERD (gastroesophageal reflux disease)    Hiatal hernia    History of colon polyps 01/12/2013   hyperplastic   History of external beam radiation therapy    face and right thigh , completed 2006 /  right forearm ,  4000 Gy,  completed 06/ 2017   History of kidney stones    History of radiation therapy    of lymphoma on skin: face and leg   Hyperlipidemia    Hypertension    Macular degeneration    left > right   OSA on CPAP    Primary cutaneous lymphoma [facility]) oncologist-  dr Hedda Liv (cancer center [place])--- per lov note 10--04-2017 -- no recurrence clinically   dx 2006 -- face and right thigh cutaneous lymphoma s/p radiation therapy (done in [place] );  2012 right forearm knot s/p bx- may 2015 nodular lymphocytic infiltrate s/o immunocytoma;  04/ 2017  cutaneous marginal zone B Cell lymphoma , right forearm (punch bx [id number])  s/p  radiation therapy 06/ 2017   SSBE (short-segment Barrett's esophagus)     Objective:  Physical Exam: Vascular: DP/PT pulses 2/4 bilateral. CFT <3 seconds. Normal hair growth on digits. No edema.  Skin. No lacerations or abrasions bilateral feet.  Musculoskeletal: MMT 5/5 bilateral lower extremities in DF, PF, Inversion and Eversion. Deceased ROM in DF of ankle joint. Non tender to  the lateral calcaneal tubercle left Some tenderness around lateral arch . No pain with achilles, PT . No pain with calcaneal squeeze.  Neurological: Sensation intact to light touch.   Assessment:   1. Plantar fasciitis, left       Plan:  Patient was evaluated and treated and all questions answered.  Discussed plantar fasciitis with patient.  X-rays reviewed and discussed with patient. No acute fractures or dislocations noted. Mild spurring noted at inferior calcaneus.  Discussed treatment options including, ice, NSAIDS, supportive shoes, bracing, and stretching.  Continue stretching and brace. Anti-inflammatories as needed.  Follow-up as needed  Jennefer Moats, DPM

## 2024-02-17 ENCOUNTER — Ambulatory Visit (INDEPENDENT_AMBULATORY_CARE_PROVIDER_SITE_OTHER): Admitting: Podiatry

## 2024-02-17 ENCOUNTER — Ambulatory Visit (INDEPENDENT_AMBULATORY_CARE_PROVIDER_SITE_OTHER)

## 2024-02-17 VITALS — Ht 74.0 in | Wt 265.0 lb

## 2024-02-17 DIAGNOSIS — M722 Plantar fascial fibromatosis: Secondary | ICD-10-CM

## 2024-02-17 DIAGNOSIS — M216X1 Other acquired deformities of right foot: Secondary | ICD-10-CM | POA: Diagnosis not present

## 2024-02-17 DIAGNOSIS — M7751 Other enthesopathy of right foot: Secondary | ICD-10-CM

## 2024-02-17 DIAGNOSIS — M216X2 Other acquired deformities of left foot: Secondary | ICD-10-CM | POA: Diagnosis not present

## 2024-02-17 MED ORDER — MELOXICAM 15 MG PO TABS: 15.0000 mg | ORAL_TABLET | Freq: Every day | ORAL | 0 refills | Status: AC

## 2024-02-17 MED ORDER — TRIAMCINOLONE ACETONIDE 10 MG/ML IJ SUSP
10.0000 mg | Freq: Once | INTRAMUSCULAR | Status: AC
  Administered 2024-02-17: 10 mg

## 2024-02-17 NOTE — Patient Instructions (Signed)

## 2024-02-17 NOTE — Progress Notes (Signed)
 Chief Complaint  Patient presents with   Foot Pain    Intense pain from heel spur in right foot hard to walk    HPI: 73 y.o. male presenting today with c/o pain in the bottom of the right heel.  Pain pain here started 2 days ago and is quite severe.  He has had difficulty putting weight on the right heel.  He is dealt with similar issue to the left foot and sees Dr. Alvah Auerbach for this.  Past Medical History:  Diagnosis Date   Chronically dry eyes, bilateral    GERD (gastroesophageal reflux disease)    Hiatal hernia    History of colon polyps 01/12/2013   hyperplastic   History of external beam radiation therapy    face and right thigh , completed 2006 /  right forearm ,  4000 Gy,  completed 06/ 2017   History of kidney stones    History of radiation therapy    of lymphoma on skin: face and leg   Hyperlipidemia    Hypertension    Macular degeneration    left > right   OSA on CPAP    Primary cutaneous lymphoma [facility]) oncologist-  dr Hedda Liv (cancer center [place])--- per lov note 10--04-2017 -- no recurrence clinically   dx 2006 -- face and right thigh cutaneous lymphoma s/p radiation therapy (done in [place] );  2012 right forearm knot s/p bx- may 2015 nodular lymphocytic infiltrate s/o immunocytoma;  04/ 2017  cutaneous marginal zone B Cell lymphoma , right forearm (punch bx [id number])  s/p  radiation therapy 06/ 2017   SSBE (short-segment Barrett's esophagus)    Past Surgical History:  Procedure Laterality Date   COLONOSCOPY WITH ESOPHAGOGASTRODUODENOSCOPY (EGD)  01/12/2014   in [place]    CYSTOSCOPY/URETEROSCOPY/HOLMIUM LASER/STENT PLACEMENT Bilateral 08/04/2017   Procedure: CYSTOSCOPY/ RETROGRADE/URETEROSCOPY/HOLMIUM LASER/STENT PLACEMENT;  Surgeon: Adelbert Homans, MD;  Location: [facility];  Service: Urology;  Laterality: Bilateral;   ESOPHAGOGASTRODUODENOSCOPY     EXTRACORPOREAL SHOCK WAVE LITHOTRIPSY  1990s   HYDROCELE EXCISION Left 2000   approx.   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  Dermatology:  No ecchymosis, erythema, or edema bilateral.  No open lesions.    Vascular: Palpable pedal pulses bilaterally. Capillary refill within normal limits.  No appreciable edema.    Neurological: Epicritic sensation is intact  Musculoskeletal Exam:  There is pain on palpation of the plantarmedial & plantarcentral aspect of right heel.  No gaps or nodules within the plantar fascia.  Positive Windlass mechanism bilateral.  Antalgic gait noted with first few steps upon standing.  No pain on palpation of achilles tendon bilateral.  Ankle df less than 10 degrees with knee extended b/l.  Radiographic Exam: Right foot 3 views 02/17/2024 Normal osseous mineralization. Joint spaces preserved.  No fractures noted.  Some calcaneal spurring is noted plantar aspect and posterior aspect.  Os peroneum noted.  Assessment/Plan of Care: 1. Plantar fasciitis of right foot   2. Acquired equinus deformity of both feet     Meds ordered this encounter  Medications   meloxicam  (MOBIC ) 15 MG tablet    Sig: Take 1 tablet (15 mg total) by mouth daily.    Dispense:  30 tablet    Refill:  0   triamcinolone  acetonide (KENALOG ) 10 MG/ML injection 10 mg   None  -Reviewed etiology of plantar fasciitis with patient.  Radiographs reviewed with patient discussed treatment options with patient today, including cortisone injection, NSAID course of treatment, stretching exercises, physical therapy, use of night splint, rest, icing the  heel, arch supports/orthotics, and supportive shoe gear.   Course of oral meloxicam  sent to patient's pharmacy.  15 mg once a day by mouth, 30 tablets  With the patient's verbal consent, a corticosteroid injection was administered to the right heel, consisting of a mixture of 1% lidocaine  plain, 0.5% Sensorcaine  plain, and Kenalog -10 for a total of 2.0 cc administered.  A Band-aid was applied.  Plantar fascia brace dispensed to patient today.  This is an ankle gauntlet device with elastic strap to help alleviate pressure off of medial plantar fascia and arch during ambulatory activity.  Stretching exercises discussed with patient.  Stretching regimen with written instructions dispensed  Return in about 3 weeks (around 03/09/2024) for Plantar Fasciitis.  Follow-up with Dr. Christa Course L. Lunda Salines, AACFAS [facility]     [address] [place], [place] [address]                Office [phone]  Fax [phone]

## 2024-03-08 ENCOUNTER — Encounter: Payer: Self-pay | Admitting: Podiatry

## 2024-03-08 ENCOUNTER — Ambulatory Visit (INDEPENDENT_AMBULATORY_CARE_PROVIDER_SITE_OTHER): Admitting: Podiatry

## 2024-03-08 DIAGNOSIS — M722 Plantar fascial fibromatosis: Secondary | ICD-10-CM | POA: Diagnosis not present

## 2024-03-08 MED ORDER — METHYLPREDNISOLONE 4 MG PO TBPK
ORAL_TABLET | ORAL | 0 refills | Status: DC
Start: 2024-03-08 — End: 2024-08-16

## 2024-03-08 NOTE — Progress Notes (Signed)
  Subjective:  Patient ID: Michael Huffman, male    DOB: 12-06-1950,   MRN: [id number]  Chief Complaint  Patient presents with   Foot Pain    RM#7 follow up on bone spurs right foot injection not helping.Seen in office 02/17/2024 xray's completed.     73 y.o. male presents for follow-up of right foot pain. He was seen on 5/29 but Dr. Marvis Sluder for new issues with right plantar fasciits. He was given a shot and melxoicam. Relates not much imrpovement in pain and still getting pain first steps in the morning.  . Denies any other pedal complaints. Denies n/v/f/c.   Past Medical History:  Diagnosis Date   Chronically dry eyes, bilateral    GERD (gastroesophageal reflux disease)    Hiatal hernia    History of colon polyps 01/12/2013   hyperplastic   History of external beam radiation therapy    face and right thigh , completed 2006 /  right forearm ,  4000 Gy,  completed 06/ 2017   History of kidney stones    History of radiation therapy    of lymphoma on skin: face and leg   Hyperlipidemia    Hypertension    Macular degeneration    left > right   OSA on CPAP    Primary cutaneous lymphoma [facility]) oncologist-  dr Hedda Liv (cancer center [place])--- per lov note 10--04-2017 -- no recurrence clinically   dx 2006 -- face and right thigh cutaneous lymphoma s/p radiation therapy (done in [place] );  2012 right forearm knot s/p bx- may 2015 nodular lymphocytic infiltrate s/o immunocytoma;  04/ 2017  cutaneous marginal zone B Cell lymphoma , right forearm (punch bx [id number])  s/p  radiation therapy 06/ 2017   SSBE (short-segment Barrett's esophagus)     Objective:  Physical Exam: Vascular: DP/PT pulses 2/4 bilateral. CFT <3 seconds. Normal hair growth on digits. No edema.  Skin. No lacerations or abrasions bilateral feet.  Musculoskeletal: MMT 5/5 bilateral lower extremities in DF, PF, Inversion and Eversion. Deceased ROM in DF of ankle joint. Tender to the medial calcaneal tubercle right .  No pain with achilles, PT or arch. No pain with calcaneal squeeze.  Neurological: Sensation intact to light touch.   Assessment:   1. Plantar fasciitis of right foot      Plan:  Patient was evaluated and treated and all questions answered. Discussed plantar fasciitis with patient.  X-rays reviewed and discussed with patient. No acute fractures or dislocations noted. Mild spurring noted at inferior calcaneus.  Discussed treatment options including, ice, NSAIDS, supportive shoes, bracing, and stretching. Continue stretching and brace.  Will defer injection as it is too early.  Medrol  dose pack provided.   Follow-up 3 weeks or sooner if any problems arise. In the meantime, encouraged to call the office with any questions, concerns, change in symptoms.     Jennefer Moats, DPM

## 2024-03-08 NOTE — Patient Instructions (Signed)

## 2024-03-21 DIAGNOSIS — Z1331 Encounter for screening for depression: Secondary | ICD-10-CM | POA: Diagnosis not present

## 2024-03-21 DIAGNOSIS — Z0000 Encounter for general adult medical examination without abnormal findings: Secondary | ICD-10-CM | POA: Diagnosis not present

## 2024-03-21 DIAGNOSIS — K2270 Barrett's esophagus without dysplasia: Secondary | ICD-10-CM | POA: Diagnosis not present

## 2024-03-21 DIAGNOSIS — M179 Osteoarthritis of knee, unspecified: Secondary | ICD-10-CM | POA: Diagnosis not present

## 2024-03-21 DIAGNOSIS — I10 Essential (primary) hypertension: Secondary | ICD-10-CM | POA: Diagnosis not present

## 2024-03-21 DIAGNOSIS — Z125 Encounter for screening for malignant neoplasm of prostate: Secondary | ICD-10-CM | POA: Diagnosis not present

## 2024-03-21 DIAGNOSIS — R7303 Prediabetes: Secondary | ICD-10-CM | POA: Diagnosis not present

## 2024-03-21 DIAGNOSIS — N200 Calculus of kidney: Secondary | ICD-10-CM | POA: Diagnosis not present

## 2024-03-21 DIAGNOSIS — C8590 Non-Hodgkin lymphoma, unspecified, unspecified site: Secondary | ICD-10-CM | POA: Diagnosis not present

## 2024-03-21 DIAGNOSIS — G4733 Obstructive sleep apnea (adult) (pediatric): Secondary | ICD-10-CM | POA: Diagnosis not present

## 2024-03-21 DIAGNOSIS — E7800 Pure hypercholesterolemia, unspecified: Secondary | ICD-10-CM | POA: Diagnosis not present

## 2024-03-29 ENCOUNTER — Encounter: Payer: Self-pay | Admitting: Podiatry

## 2024-03-29 ENCOUNTER — Ambulatory Visit (INDEPENDENT_AMBULATORY_CARE_PROVIDER_SITE_OTHER): Admitting: Podiatry

## 2024-03-29 DIAGNOSIS — M722 Plantar fascial fibromatosis: Secondary | ICD-10-CM | POA: Diagnosis not present

## 2024-03-29 MED ORDER — TRIAMCINOLONE ACETONIDE 10 MG/ML IJ SUSP
2.5000 mg | Freq: Once | INTRAMUSCULAR | Status: AC
  Administered 2024-03-29: 2.5 mg via INTRA_ARTICULAR

## 2024-03-29 MED ORDER — DEXAMETHASONE SODIUM PHOSPHATE 120 MG/30ML IJ SOLN
4.0000 mg | Freq: Once | INTRAMUSCULAR | Status: AC
  Administered 2024-03-29: 4 mg via INTRA_ARTICULAR

## 2024-03-29 NOTE — Progress Notes (Signed)
  Subjective:  Patient ID: Michael Huffman, male    DOB: 21-Oct-1950,   MRN: [id number]  Chief Complaint  Patient presents with   Plantar Fasciitis    It feels fine mostly.  If I stand on it for a length of time, I see stars.    73 y.o. male presents for follow-up of right foot pain. He relates mostly doing ok but if on feet for a while very painful. He relates the medrol  dose pack did help some but still getting some pain,   . Denies any other pedal complaints. Denies n/v/f/c.   Past Medical History:  Diagnosis Date   Chronically dry eyes, bilateral    GERD (gastroesophageal reflux disease)    Hiatal hernia    History of colon polyps 01/12/2013   hyperplastic   History of external beam radiation therapy    face and right thigh , completed 2006 /  right forearm ,  4000 Gy,  completed 06/ 2017   History of kidney stones    History of radiation therapy    of lymphoma on skin: face and leg   Hyperlipidemia    Hypertension    Macular degeneration    left > right   OSA on CPAP    Primary cutaneous lymphoma [facility]) oncologist-  dr cindy joe (cancer center [place])--- per lov note 10--04-2017 -- no recurrence clinically   dx 2006 -- face and right thigh cutaneous lymphoma s/p radiation therapy (done in [place] );  2012 right forearm knot s/p bx- may 2015 nodular lymphocytic infiltrate s/o immunocytoma;  04/ 2017  cutaneous marginal zone B Cell lymphoma , right forearm (punch bx [id number])  s/p  radiation therapy 06/ 2017   SSBE (short-segment Barrett's esophagus)     Objective:  Physical Exam: Vascular: DP/PT pulses 2/4 bilateral. CFT <3 seconds. Normal hair growth on digits. No edema.  Skin. No lacerations or abrasions bilateral feet.  Musculoskeletal: MMT 5/5 bilateral lower extremities in DF, PF, Inversion and Eversion. Deceased ROM in DF of ankle joint. Tender to the medial calcaneal tubercle right . No pain with achilles, PT or arch. No pain with calcaneal squeeze.   Neurological: Sensation intact to light touch.   Assessment:   1. Plantar fasciitis of right foot       Plan:  Patient was evaluated and treated and all questions answered. Discussed plantar fasciitis with patient.  X-rays reviewed and discussed with patient. No acute fractures or dislocations noted. Mild spurring noted at inferior calcaneus.  Discussed treatment options including, ice, NSAIDS, supportive shoes, bracing, and stretching. Continue stretching and brace.  Discussed CMO.  Injection offered today procedure below.  Follow-up 6 weeks or sooner if any problems arise. In the meantime, encouraged to call the office with any questions, concerns, change in symptoms.   Procedure: Injection Tendon/Ligament Discussed alternatives, risks, complications and verbal consent was obtained.  Location: Right plantar fascia. Skin Prep: Alcohol . Injectate: 1cc 0.5% marcaine  plain, 1 cc dexamethasone  0.5 cc kenalog   Disposition: Patient tolerated procedure well. Injection site dressed with a band-aid.  Post-injection care was discussed and return precautions discussed.    Michael Huffman, DPM

## 2024-04-04 DIAGNOSIS — G4733 Obstructive sleep apnea (adult) (pediatric): Secondary | ICD-10-CM | POA: Diagnosis not present

## 2024-04-17 DIAGNOSIS — H43813 Vitreous degeneration, bilateral: Secondary | ICD-10-CM | POA: Diagnosis not present

## 2024-04-17 DIAGNOSIS — H35033 Hypertensive retinopathy, bilateral: Secondary | ICD-10-CM | POA: Diagnosis not present

## 2024-04-17 DIAGNOSIS — H34832 Tributary (branch) retinal vein occlusion, left eye, with macular edema: Secondary | ICD-10-CM | POA: Diagnosis not present

## 2024-04-17 DIAGNOSIS — H353132 Nonexudative age-related macular degeneration, bilateral, intermediate dry stage: Secondary | ICD-10-CM | POA: Diagnosis not present

## 2024-05-10 ENCOUNTER — Ambulatory Visit (INDEPENDENT_AMBULATORY_CARE_PROVIDER_SITE_OTHER): Admitting: Podiatry

## 2024-05-10 ENCOUNTER — Encounter: Payer: Self-pay | Admitting: Podiatry

## 2024-05-10 DIAGNOSIS — M722 Plantar fascial fibromatosis: Secondary | ICD-10-CM

## 2024-05-10 MED ORDER — TRIAMCINOLONE ACETONIDE 10 MG/ML IJ SUSP
2.5000 mg | Freq: Once | INTRAMUSCULAR | Status: AC
  Administered 2024-05-10: 2.5 mg via INTRA_ARTICULAR

## 2024-05-10 MED ORDER — DEXAMETHASONE SODIUM PHOSPHATE 120 MG/30ML IJ SOLN
4.0000 mg | Freq: Once | INTRAMUSCULAR | Status: AC
  Administered 2024-05-10: 4 mg via INTRA_ARTICULAR

## 2024-05-10 NOTE — Progress Notes (Signed)
  Subjective:  Patient ID: Michael Huffman, male    DOB: 03-13-1951,   MRN: [id number]  Chief Complaint  Patient presents with   Plantar Fasciitis    It's pretty much the same.  I ice it up at night.  It's fine in the morning then it starts up all over.    73 y.o. male presents for follow-up of right foot pain. He relates doing about the same. The injection helped for a few days but then wore off. Still getting a lot of pain when getting up from rest. Has been stretching occasionally about 1-2 times a week  . Denies any other pedal complaints. Denies n/v/f/c.   Past Medical History:  Diagnosis Date   Chronically dry eyes, bilateral    GERD (gastroesophageal reflux disease)    Hiatal hernia    History of colon polyps 01/12/2013   hyperplastic   History of external beam radiation therapy    face and right thigh , completed 2006 /  right forearm ,  4000 Gy,  completed 06/ 2017   History of kidney stones    History of radiation therapy    of lymphoma on skin: face and leg   Hyperlipidemia    Hypertension    Macular degeneration    left > right   OSA on CPAP    Primary cutaneous lymphoma [facility]) oncologist-  dr cindy joe (cancer center [place])--- per lov note 10--04-2017 -- no recurrence clinically   dx 2006 -- face and right thigh cutaneous lymphoma s/p radiation therapy (done in [place] );  2012 right forearm knot s/p bx- may 2015 nodular lymphocytic infiltrate s/o immunocytoma;  04/ 2017  cutaneous marginal zone B Cell lymphoma , right forearm (punch bx [id number])  s/p  radiation therapy 06/ 2017   SSBE (short-segment Barrett's esophagus)     Objective:  Physical Exam: Vascular: DP/PT pulses 2/4 bilateral. CFT <3 seconds. Normal hair growth on digits. No edema.  Skin. No lacerations or abrasions bilateral feet.  Musculoskeletal: MMT 5/5 bilateral lower extremities in DF, PF, Inversion and Eversion. Deceased ROM in DF of ankle joint. Tender to the medial calcaneal tubercle  right . No pain with achilles, PT or arch. No pain with calcaneal squeeze.  Neurological: Sensation intact to light touch.   Assessment:   1. Plantar fasciitis of right foot        Plan:  Patient was evaluated and treated and all questions answered. Discussed plantar fasciitis with patient.  X-rays reviewed and discussed with patient. No acute fractures or dislocations noted. Mild spurring noted at inferior calcaneus.  Discussed treatment options including, ice, NSAIDS, supportive shoes, bracing, and stretching. Continue stretching and brace.  Discussed CMO.  Amb ref to PT.  Injection offered today procedure below.  Follow-up 9 weeks or sooner if any problems arise. In the meantime, encouraged to call the office with any questions, concerns, change in symptoms.   Procedure: Injection Tendon/Ligament Discussed alternatives, risks, complications and verbal consent was obtained.  Location: Right plantar fascia. Skin Prep: Alcohol . Injectate: 1cc 0.5% marcaine  plain, 1 cc dexamethasone  0.5 cc kenalog   Disposition: Patient tolerated procedure well. Injection site dressed with a band-aid.  Post-injection care was discussed and return precautions discussed.    [name], DPM

## 2024-05-30 ENCOUNTER — Ambulatory Visit: Payer: PRIVATE HEALTH INSURANCE

## 2024-06-12 ENCOUNTER — Telehealth: Payer: Self-pay | Admitting: Internal Medicine

## 2024-06-12 NOTE — Telephone Encounter (Signed)
 9/12 voicemail left for patient to call back to reschedule appointments due to provider being out of office   9/22 appointments rescheduled and mailed to patient

## 2024-06-14 ENCOUNTER — Encounter: Payer: Self-pay | Admitting: Podiatry

## 2024-06-14 ENCOUNTER — Ambulatory Visit (INDEPENDENT_AMBULATORY_CARE_PROVIDER_SITE_OTHER): Admitting: Podiatry

## 2024-06-14 DIAGNOSIS — M722 Plantar fascial fibromatosis: Secondary | ICD-10-CM | POA: Diagnosis not present

## 2024-06-14 NOTE — Progress Notes (Signed)
  Subjective:  Patient ID: Michael Huffman, male    DOB: 10-06-1950,   MRN: [id number]  Chief Complaint  Patient presents with   Plantar Fasciitis    It's terrible.  I have an appointment for orthotics on the 1st.  Hopefully that will do the trick.    73 y.o. male presents for follow-up of right foot pain. He relates doing about the same and still in a lot of pain. . The injection helped for a few days but then wore off. Still getting a lot of pain when getting up from rest. Has been stretching occasionally about 1-2 times a week  . Denies any other pedal complaints. Denies n/v/f/c.   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 CFT <3 seconds. Normal hair growth on digits. No edema.  Skin. No lacerations or abrasions bilateral feet.  Musculoskeletal: MMT 5/5 bilateral lower extremities in DF, PF, Inversion and Eversion. Deceased ROM in DF of ankle joint. Tender to the  medial calcaneal tubercle right . No pain with achilles, PT or arch. No pain with calcaneal squeeze.  Neurological: Sensation intact to light touch.   Assessment:   1. Plantar fasciitis of right foot         Plan:  Patient was evaluated and treated and all questions answered. Discussed plantar fasciitis with patient.  X-rays reviewed and discussed with patient. No acute fractures or dislocations noted. Mild spurring noted at inferior calcaneus.  Discussed treatment options including, ice, NSAIDS, supportive shoes, bracing, and stretching. Continue stretching and brace.  Discussed CMO.  Continue PT. Referral sent external and will try [facility] as was not able to get in with [facility].  Has had three injections in this area this year.  Discussed PRP injection vs surgical intervention.  Patient would like to consider PRP.  Follow-up for PRP injection.      [name], DPM

## 2024-06-21 ENCOUNTER — Ambulatory Visit

## 2024-06-21 NOTE — Progress Notes (Signed)
 Orthotics   Patient was present and evaluated for Custom molded foot orthotics. Patient will benefit from CFO's to provide total contact to BIL MLA's helping to balance and distribute body weight more evenly across BIL feet helping to reduce plantar pressure and pain. Orthotic will also encourage FF / RF alignment  Patient was scanned today and will return for fitting upon receipt  [employer] does not cover 2ndary retiree from city [place] may cover [place] will need to reject then send to 2ndary   [employer], CFo, CFm

## 2024-06-28 ENCOUNTER — Ambulatory Visit: Admitting: Podiatry

## 2024-07-06 ENCOUNTER — Telehealth: Payer: Self-pay

## 2024-07-06 NOTE — Telephone Encounter (Signed)
 Good Morning ,  pt was called by you due to the fact his appt.10/8 for PRP injection was scheduled incorrectly .Please advise pt can be reached @ [phone] ty

## 2024-07-07 NOTE — Telephone Encounter (Signed)
 Patient has been rescheduled for 07/18/2024 and the provider's schedule has been blocked at the correlating time.

## 2024-07-10 ENCOUNTER — Ambulatory Visit: Admitting: Podiatry

## 2024-07-11 ENCOUNTER — Other Ambulatory Visit: Payer: PRIVATE HEALTH INSURANCE

## 2024-07-11 ENCOUNTER — Ambulatory Visit: Payer: PRIVATE HEALTH INSURANCE | Admitting: Internal Medicine

## 2024-07-11 DIAGNOSIS — G4733 Obstructive sleep apnea (adult) (pediatric): Secondary | ICD-10-CM | POA: Diagnosis not present

## 2024-07-18 ENCOUNTER — Telehealth: Payer: Self-pay

## 2024-07-18 ENCOUNTER — Ambulatory Visit (INDEPENDENT_AMBULATORY_CARE_PROVIDER_SITE_OTHER): Admitting: Podiatry

## 2024-07-18 DIAGNOSIS — M722 Plantar fascial fibromatosis: Secondary | ICD-10-CM

## 2024-07-18 NOTE — Progress Notes (Signed)
  Subjective:  Patient ID: Michael Huffman, male    DOB: Feb 04, 1951,   MRN: [id number]  No chief complaint on file.   73 y.o. male presents for follow-up of right foot plantar fasciitis.  Here for PRP and ultrasound evaluation. Denies any other pedal complaints. Denies n/v/f/c.   Past Medical History:  Diagnosis Date   Chronically dry eyes, bilateral    GERD (gastroesophageal reflux disease)    Hiatal hernia    History of colon polyps 01/12/2013   hyperplastic   History of external beam radiation therapy    face and right thigh , completed 2006 /  right forearm ,  4000 Gy,  completed 06/ 2017   History of kidney stones    History of radiation therapy    of lymphoma on skin: face and leg   Hyperlipidemia    Hypertension    Macular degeneration    left > right   OSA on CPAP    Primary cutaneous lymphoma [facility]) oncologist-  dr cindy joe (cancer center [place])--- per lov note 10--04-2017 -- no recurrence clinically   dx 2006 -- face and right thigh cutaneous lymphoma s/p radiation therapy (done in [place] );  2012 right forearm knot s/p bx- may 2015 nodular lymphocytic infiltrate s/o immunocytoma;  04/ 2017  cutaneous marginal zone B Cell lymphoma , right forearm (punch bx [id number])  s/p  radiation therapy 06/ 2017   SSBE (short-segment Barrett's esophagus)     Objective:  Physical Exam: Vascular: DP/PT pulses 2/4 bilateral. CFT <3 seconds. Normal hair growth on digits. No edema.  Skin. No lacerations or abrasions bilateral feet.  Musculoskeletal: MMT 5/5 bilateral lower extremities in DF, PF, Inversion and Eversion. Deceased ROM in DF of ankle joint. Tender to the medial calcaneal tubercle right . No pain with achilles, PT or arch. No pain with calcaneal squeeze.  Neurological: Sensation intact to light touch.   Assessment:   1. Plantar fasciitis of right foot         Plan:  Patient was evaluated and treated and all questions answered. Discussed plantar fasciitis  with patient.  X-rays reviewed and discussed with patient. No acute fractures or dislocations noted. Mild spurring noted at inferior calcaneus.  Discussed treatment options including, ice, NSAIDS, supportive shoes, bracing, and stretching. Continue stretching and brace.  Discussed CMO. Awaiting Orthotic fitting.  Continue PT. Referral sent external and will try emerge as was not able to get in with [facility].  PRP injection procedure below and ultrasound evaluation.   Ultrasound: Right heel ultrasound performed with evaluation of plantar fascia no noted tears.  Thickening of the fascia noted with about 0.54 mm in width from distal calcaneus.    Procedure: Injection Tendon/Ligament Discussed alternatives, risks, complications and verbal consent was obtained.  Location: Right plantar fascia. Skin Prep: Betadine . Injectate: 1cc 2% lidocaine  plain 4 cc of PRP injectate Disposition: Patient tolerated procedure well. Injection site dressed with a band-aid.  Post-injection care was discussed and return precautions discussed.       [name], DPM

## 2024-07-18 NOTE — Telephone Encounter (Signed)
 Patient had PRP yesterday - he is asking if it is OK to get his flu shot on Friday

## 2024-07-19 NOTE — Telephone Encounter (Signed)
 Patient informed it is ok to have.

## 2024-07-20 ENCOUNTER — Other Ambulatory Visit

## 2024-07-31 ENCOUNTER — Ambulatory Visit: Payer: PRIVATE HEALTH INSURANCE | Admitting: Internal Medicine

## 2024-07-31 ENCOUNTER — Other Ambulatory Visit: Payer: PRIVATE HEALTH INSURANCE

## 2024-08-05 DIAGNOSIS — Z23 Encounter for immunization: Secondary | ICD-10-CM | POA: Diagnosis not present

## 2024-08-08 ENCOUNTER — Telehealth: Payer: Self-pay

## 2024-08-08 NOTE — Telephone Encounter (Signed)
 Called patient to schedule [facility]O appointment. Unable to connect with patient. LVM to have patient call us  back and schedule the PUO appointment.

## 2024-08-16 ENCOUNTER — Ambulatory Visit (INDEPENDENT_AMBULATORY_CARE_PROVIDER_SITE_OTHER): Admitting: Podiatry

## 2024-08-16 DIAGNOSIS — M722 Plantar fascial fibromatosis: Secondary | ICD-10-CM

## 2024-08-16 NOTE — Progress Notes (Signed)
  Subjective:  Patient ID: Michael Huffman, male    DOB: 02-Oct-1950,   MRN: [id number]  Chief Complaint  Patient presents with   Foot Pain    R foot pf post PRP. 20% improvement. R lateral side plantar numbness. Not diabetic no anti coag    73 y.o. male presents for follow-up of right foot plantar fasciitis.  Has had PRP injection and relates maybe some better.  About 20%.  He is also here to pick up custom orthotics.  Denies any other pedal complaints. Denies n/v/f/c.   Past Medical History:  Diagnosis Date   Chronically dry eyes, bilateral    GERD (gastroesophageal reflux disease)    Hiatal hernia    History of colon polyps 01/12/2013   hyperplastic   History of external beam radiation therapy    face and right thigh , completed 2006 /  right forearm ,  4000 Gy,  completed 06/ 2017   History of kidney stones    History of radiation therapy    of lymphoma on skin: face and leg   Hyperlipidemia    Hypertension    Macular degeneration    left > right   OSA on CPAP    Primary cutaneous lymphoma [facility]) oncologist-  dr cindy joe (cancer center [place])--- per lov note 10--04-2017 -- no recurrence clinically   dx 2006 -- face and right thigh cutaneous lymphoma s/p radiation therapy (done in [place] );  2012 right forearm knot s/p bx- may 2015 nodular lymphocytic infiltrate s/o immunocytoma;  04/ 2017  cutaneous marginal zone B Cell lymphoma , right forearm (punch bx [id number])  s/p  radiation therapy 06/ 2017   SSBE (short-segment Barrett's esophagus)     Objective:  Physical Exam: Vascular: DP/PT pulses 2/4 bilateral. CFT <3 seconds. Normal hair growth on digits. No edema.  Skin. No lacerations or abrasions bilateral feet.  Musculoskeletal: MMT 5/5 bilateral lower extremities in DF, PF, Inversion and Eversion. Deceased ROM in DF of ankle joint. Tender to the medial calcaneal tubercle right . No pain with achilles, PT or arch. No pain with calcaneal squeeze.  Neurological:  Sensation intact to light touch.   Assessment:   1. Plantar fasciitis of right foot          Plan:  Patient was evaluated and treated and all questions answered. Discussed plantar fasciitis with patient.  X-rays reviewed and discussed with patient. No acute fractures or dislocations noted. Mild spurring noted at inferior calcaneus.  Discussed treatment options including, ice, NSAIDS, supportive shoes, bracing, and stretching. Continue stretching and brace.  See how PRP injection helps.  Will also see how custom molded orthotics help.  Follow-up in 6 weeks for recheck     Michael Huffman, DPM

## 2024-08-21 ENCOUNTER — Encounter: Payer: Self-pay | Admitting: Dermatology

## 2024-08-21 ENCOUNTER — Ambulatory Visit: Payer: PRIVATE HEALTH INSURANCE | Admitting: Dermatology

## 2024-08-21 DIAGNOSIS — L814 Other melanin hyperpigmentation: Secondary | ICD-10-CM | POA: Diagnosis not present

## 2024-08-21 DIAGNOSIS — L219 Seborrheic dermatitis, unspecified: Secondary | ICD-10-CM | POA: Diagnosis not present

## 2024-08-21 DIAGNOSIS — D1801 Hemangioma of skin and subcutaneous tissue: Secondary | ICD-10-CM

## 2024-08-21 DIAGNOSIS — L578 Other skin changes due to chronic exposure to nonionizing radiation: Secondary | ICD-10-CM

## 2024-08-21 DIAGNOSIS — L821 Other seborrheic keratosis: Secondary | ICD-10-CM | POA: Diagnosis not present

## 2024-08-21 DIAGNOSIS — C84A0 Cutaneous T-cell lymphoma, unspecified, unspecified site: Secondary | ICD-10-CM | POA: Diagnosis not present

## 2024-08-21 DIAGNOSIS — W908XXA Exposure to other nonionizing radiation, initial encounter: Secondary | ICD-10-CM | POA: Diagnosis not present

## 2024-08-21 DIAGNOSIS — D492 Neoplasm of unspecified behavior of bone, soft tissue, and skin: Secondary | ICD-10-CM | POA: Diagnosis not present

## 2024-08-21 DIAGNOSIS — D229 Melanocytic nevi, unspecified: Secondary | ICD-10-CM

## 2024-08-21 DIAGNOSIS — Z1283 Encounter for screening for malignant neoplasm of skin: Secondary | ICD-10-CM | POA: Diagnosis not present

## 2024-08-21 NOTE — Patient Instructions (Addendum)

## 2024-08-21 NOTE — Progress Notes (Signed)
 Follow-Up Visit   Subjective  Michael Huffman is a 73 y.o. male who presents for the following: Skin Cancer Screening and Full Body Skin Exam. No personal Hx of skin cancer or dysplastic nevi. Hx of cutaneous lymphoma.    Area of concern on left arm.  The patient presents for Total-Body Skin Exam (TBSE) for skin cancer screening and mole check. The patient has spots, moles and lesions to be evaluated, some may be new or changing and the patient may have concern these could be cancer.    The following portions of the chart were reviewed this encounter and updated as appropriate: medications, allergies, medical history  Review of Systems:  No other skin or systemic complaints except as noted in HPI or Assessment and Plan.  Objective  Well appearing patient in no apparent distress; mood and affect are within normal limits.  A full examination was performed including scalp, head, eyes, ears, nose, lips, neck, chest, axillae, abdomen, back, buttocks, bilateral upper extremities, bilateral lower extremities, hands, feet, fingers, toes, fingernails, and toenails. All findings within normal limits unless otherwise noted below.   Relevant physical exam findings are noted in the Assessment and Plan.  Left proximal forearm near elbow 1 cm pink soft plaque  Left lateral distal thigh 1.5 x 1 cm deep pink to red semi-firm papules coalescing to a plaque   Assessment & Plan   SKIN CANCER SCREENING PERFORMED TODAY.  ACTINIC DAMAGE - Chronic condition, secondary to cumulative UV/sun exposure - diffuse scaly erythematous macules with underlying dyspigmentation - Recommend daily broad spectrum sunscreen SPF 30+ to sun-exposed areas, reapply every 2 hours as needed.  - Staying in the shade or wearing long sleeves, sun glasses (UVA+UVB protection) and wide brim hats (4-inch brim around the entire circumference of the hat) are also recommended for sun protection.  - Call for new or changing  lesions.  LENTIGINES, SEBORRHEIC KERATOSES, HEMANGIOMAS - Benign normal skin lesions - Benign-appearing - Call for any changes  MELANOCYTIC NEVI - Tan-brown and/or pink-flesh-colored symmetric macules and papules - Benign appearing on exam today - Observation - Call clinic for new or changing moles - Recommend daily use of broad spectrum spf 30+ sunscreen to sun-exposed areas.    SEBORRHEIC DERMATITIS Exam: Pink patches with greasy scale at L temp scalp, eyebrows, mustache, beard area  Chronic and persistent condition with duration or expected duration over one year. Condition is bothersome/symptomatic for patient. Currently flared.   Seborrheic Dermatitis is a chronic persistent rash characterized by pinkness and scaling most commonly of the mid face but also can occur on the scalp (dandruff), ears; mid chest, mid back and groin.  It tends to be exacerbated by stress and cooler weather.  People who have neurologic disease may experience new onset or exacerbation of existing seborrheic dermatitis.  The condition is not curable but treatable and can be controlled.  Treatment Plan: Patient deferred treatment at this time.  NEOPLASM OF SKIN (2) Left proximal forearm near elbow Skin / nail biopsy Type of biopsy: tangential   Informed consent: discussed and consent obtained   Timeout: patient name, date of birth, surgical site, and procedure verified   Procedure prep:  Patient was prepped and draped in usual sterile fashion Prep type:  Isopropyl alcohol  Anesthesia: the lesion was anesthetized in a standard fashion   Anesthetic:  1% lidocaine  w/ epinephrine  1-100,000 buffered w/ 8.4% NaHCO3 Instrument used: DermaBlade   Hemostasis achieved with: pressure and aluminum chloride   Outcome: patient tolerated  procedure well   Post-procedure details: sterile dressing applied and wound care instructions given   Dressing type: bandage and petrolatum     Specimen 1 - Surgical  pathology Differential Diagnosis: ISK vs eczema vs lymphoma  Check Margins: No  Patient with Hx of cutaneous lymphoma  Left lateral distal thigh Skin / nail biopsy Type of biopsy: tangential   Informed consent: discussed and consent obtained   Timeout: patient name, date of birth, surgical site, and procedure verified   Procedure prep:  Patient was prepped and draped in usual sterile fashion Prep type:  Isopropyl alcohol  Anesthesia: the lesion was anesthetized in a standard fashion   Anesthetic:  1% lidocaine  w/ epinephrine  1-100,000 buffered w/ 8.4% NaHCO3 Instrument used: DermaBlade   Hemostasis achieved with: pressure and aluminum chloride   Outcome: patient tolerated procedure well   Post-procedure details: sterile dressing applied and wound care instructions given   Dressing type: bandage and petrolatum     Specimen 2 - Surgical pathology Differential Diagnosis: ISK vs eczema vs lymphoma  Check Margins: No  Patient with Hx of cutaneous lymphoma  SEBORRHEIC DERMATITIS   MULTIPLE BENIGN NEVI   ACTINIC ELASTOSIS   SEBORRHEIC KERATOSES   LENTIGINES   CHERRY ANGIOMA   Return in about 1 year (around 08/21/2025) for TBSE.  I, Kate Fought, CMA, am acting as scribe for Boneta Sharps, MD.   Documentation: I have reviewed the above documentation for accuracy and completeness, and I agree with the above.  Boneta Sharps, MD

## 2024-08-29 ENCOUNTER — Other Ambulatory Visit

## 2024-08-30 ENCOUNTER — Encounter: Payer: Self-pay | Admitting: Internal Medicine

## 2024-08-30 ENCOUNTER — Inpatient Hospital Stay: Payer: PRIVATE HEALTH INSURANCE | Attending: Internal Medicine

## 2024-08-30 ENCOUNTER — Inpatient Hospital Stay: Payer: PRIVATE HEALTH INSURANCE | Admitting: Internal Medicine

## 2024-08-30 VITALS — BP 136/82 | HR 62 | Temp 96.8°F | Resp 16 | Ht 74.0 in | Wt 265.5 lb

## 2024-08-30 DIAGNOSIS — Z572 Occupational exposure to dust: Secondary | ICD-10-CM | POA: Insufficient documentation

## 2024-08-30 DIAGNOSIS — C884 Extranodal marginal zone b-cell lymphoma of mucosa-associated lymphoid tissue (malt-lymphoma) not having achieved remission: Secondary | ICD-10-CM | POA: Diagnosis not present

## 2024-08-30 DIAGNOSIS — Z860100 Personal history of colon polyps, unspecified: Secondary | ICD-10-CM | POA: Diagnosis not present

## 2024-08-30 DIAGNOSIS — Z87891 Personal history of nicotine dependence: Secondary | ICD-10-CM | POA: Diagnosis not present

## 2024-08-30 DIAGNOSIS — Z8572 Personal history of non-Hodgkin lymphomas: Secondary | ICD-10-CM | POA: Diagnosis present

## 2024-08-30 LAB — COMPREHENSIVE METABOLIC PANEL WITH GFR
ALT: 34 U/L (ref 0–44)
AST: 30 U/L (ref 15–41)
Albumin: 4.5 g/dL (ref 3.5–5.0)
Alkaline Phosphatase: 117 U/L (ref 38–126)
Anion gap: 11 (ref 5–15)
BUN: 16 mg/dL (ref 8–23)
CO2: 27 mmol/L (ref 22–32)
Calcium: 9.7 mg/dL (ref 8.9–10.3)
Chloride: 102 mmol/L (ref 98–111)
Creatinine, Ser: 0.82 mg/dL (ref 0.61–1.24)
GFR, Estimated: >60 mL/min (ref >60)
Glucose, Bld: 117 mg/dL — ABNORMAL HIGH (ref 70–99)
Potassium: 4.5 mmol/L (ref 3.5–5.1)
Sodium: 139 mmol/L (ref 135–145)
Total Bilirubin: 1 mg/dL (ref 0.0–1.2)
Total Protein: 6.7 g/dL (ref 6.5–8.1)

## 2024-08-30 LAB — CBC WITH DIFFERENTIAL/PLATELET
Abs Immature Granulocytes: 0.01 K/uL (ref 0.00–0.07)
Basophils Absolute: 0 K/uL (ref 0.0–0.1)
Basophils Relative: 1 %
Eosinophils Absolute: 0.2 K/uL (ref 0.0–0.5)
Eosinophils Relative: 5 %
HCT: 45.5 % (ref 39.0–52.0)
Hemoglobin: 15.2 g/dL (ref 13.0–17.0)
Immature Granulocytes: 0 %
Lymphocytes Relative: 31 %
Lymphs Abs: 1.4 K/uL (ref 0.7–4.0)
MCH: 28.4 pg (ref 26.0–34.0)
MCHC: 33.4 g/dL (ref 30.0–36.0)
MCV: 84.9 fL (ref 80.0–100.0)
Monocytes Absolute: 0.5 K/uL (ref 0.1–1.0)
Monocytes Relative: 11 %
Neutro Abs: 2.4 K/uL (ref 1.7–7.7)
Neutrophils Relative %: 52 %
Platelets: 223 K/uL (ref 150–400)
RBC: 5.36 MIL/uL (ref 4.22–5.81)
RDW: 12.8 % (ref 11.5–15.5)
WBC: 4.6 K/uL (ref 4.0–10.5)
nRBC: 0 % (ref 0.0–0.2)

## 2024-08-30 LAB — LACTATE DEHYDROGENASE: LDH: 157 U/L (ref 105–235)

## 2024-08-30 NOTE — Assessment & Plan Note (Addendum)
#   Right forearm nodular lesion-Status post punch Biopsy- late April 2017 [[place]]- CUTANEOUS MARGINAL ZONE B CELL LYMPHOMA. S/p RT May 2017.   # Clinically no evidence of recurrence.  STABLE; continue dermatology [Dr.Smith] regarding surveillance [NOV 2025.  Instructed the patient to follow-up with regards to recent skin biopsy and inform us  if concern for any malignancy.   DISPOSITION:  # follow up in 1 year- MD -cbc/cmp/ldh/MD- Dr.B  Cc; Dr.Hussian; [place].

## 2024-08-30 NOTE — Progress Notes (Signed)
 Pt having injections in his foot, sees podiatry [name]. Also, seeing dermatology Dr. Claudene for 2 bx.
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
## 2024-08-31 LAB — SURGICAL PATHOLOGY

## 2024-09-04 ENCOUNTER — Encounter: Payer: Self-pay | Admitting: Dermatology

## 2024-09-04 ENCOUNTER — Ambulatory Visit: Payer: Self-pay | Admitting: Dermatology

## 2024-09-04 NOTE — Telephone Encounter (Signed)
 Dr. Claudene notified patient via MyChart and sent message to patient's oncologist, Dr. Rennie.

## 2024-09-04 NOTE — Telephone Encounter (Signed)
 Shared biopsy result with patient. Both show same lymphoma. Sent message yesterday to oncologist Dr Rennie

## 2024-09-04 NOTE — Telephone Encounter (Signed)
-----   Message from Boneta Sharps, MD sent at 09/04/2024  8:40 AM EST -----  1. Skin, left proximal forearm near elbow :       - CONSISTENT WITH MINIMAL DERMAL INVOLVEMENT BY THE PATIENT'S KNOWN CUTANEOUS       MARGINAL ZONE LYMPHOMA, SEE NOTE.        2. Skin, left lateral distal thigh :       - CONSISTENT WITH MINIMAL DERMAL INVOLVEMENT BY THE PATIENT'S KNOWN CUTANEOUS       MARGINAL ZONE LYMPHOMA, SEE NOTE.    Both biopsies show lymphoma recurrence. I called patient with result. Sent message to oncologist Dr Rennie

## 2024-09-05 DIAGNOSIS — Z23 Encounter for immunization: Secondary | ICD-10-CM | POA: Diagnosis not present

## 2024-09-12 ENCOUNTER — Telehealth: Payer: Self-pay | Admitting: *Deleted

## 2024-09-12 ENCOUNTER — Other Ambulatory Visit: Payer: Self-pay | Admitting: Internal Medicine

## 2024-09-12 DIAGNOSIS — C884 Extranodal marginal zone b-cell lymphoma of mucosa-associated lymphoid tissue (malt-lymphoma) not having achieved remission: Secondary | ICD-10-CM

## 2024-09-12 MED ORDER — DIAZEPAM 2 MG PO TABS: ORAL_TABLET | ORAL | 0 refills | Status: DC

## 2024-09-12 NOTE — Telephone Encounter (Signed)
" °  Patient called and left a detailed vm that his most recent dermatology biopsy showed lymphoma recurrence. Patient is very frustrated at the results and would like a further conversation with Dr. B to further discuss results.    Skin, left proximal forearm near elbow :       - CONSISTENT WITH MINIMAL DERMAL INVOLVEMENT BY THE PATIENT'S KNOWN CUTANEOUS       MARGINAL ZONE LYMPHOMA, SEE NOTE.         2. Skin, left lateral distal thigh :       - CONSISTENT WITH MINIMAL DERMAL INVOLVEMENT BY THE PATIENT'S KNOWN CUTANEOUS       MARGINAL ZONE LYMPHOMA, SEE NOTE.      "

## 2024-09-12 NOTE — Progress Notes (Signed)
 Discussed with Michelle-Will order PET scan ASAP.  Recommend follow-up few days after results of the PET scan to review the results. MD; no labs-  GB

## 2024-09-13 ENCOUNTER — Telehealth: Payer: Self-pay | Admitting: Internal Medicine

## 2024-09-13 NOTE — Telephone Encounter (Signed)
 Spoke to patient regarding the plan-PET scan and then follow-up follow-up.    FYI-

## 2024-09-20 ENCOUNTER — Encounter: Admission: RE | Admit: 2024-09-20 | Source: Ambulatory Visit

## 2024-09-20 ENCOUNTER — Telehealth: Payer: Self-pay | Admitting: Internal Medicine

## 2024-09-20 DIAGNOSIS — C884 Extranodal marginal zone b-cell lymphoma of mucosa-associated lymphoid tissue (malt-lymphoma) not having achieved remission: Secondary | ICD-10-CM | POA: Insufficient documentation

## 2024-09-20 LAB — GLUCOSE, CAPILLARY: Glucose-Capillary: 114 mg/dL — ABNORMAL HIGH (ref 70–99)

## 2024-09-20 MED ORDER — FLUDEOXYGLUCOSE F - 18 (FDG) INJECTION
12.9800 | Freq: Once | INTRAVENOUS | Status: AC | PRN
  Administered 2024-09-20: 12.98 via INTRAVENOUS

## 2024-09-20 NOTE — Telephone Encounter (Signed)
 Spoke patient regarding those of the PET scan-no evidence of disease.  Rec keep appointments as planned next week-to discuss the further plan.GB  FYI

## 2024-09-26 ENCOUNTER — Inpatient Hospital Stay: Payer: PRIVATE HEALTH INSURANCE | Attending: Internal Medicine | Admitting: Internal Medicine

## 2024-09-26 VITALS — BP 107/63 | HR 53 | Temp 97.7°F | Ht 74.0 in | Wt 263.0 lb

## 2024-09-26 DIAGNOSIS — J439 Emphysema, unspecified: Secondary | ICD-10-CM | POA: Diagnosis not present

## 2024-09-26 DIAGNOSIS — M549 Dorsalgia, unspecified: Secondary | ICD-10-CM | POA: Insufficient documentation

## 2024-09-26 DIAGNOSIS — M479 Spondylosis, unspecified: Secondary | ICD-10-CM | POA: Insufficient documentation

## 2024-09-26 DIAGNOSIS — K449 Diaphragmatic hernia without obstruction or gangrene: Secondary | ICD-10-CM | POA: Diagnosis not present

## 2024-09-26 DIAGNOSIS — N200 Calculus of kidney: Secondary | ICD-10-CM | POA: Diagnosis not present

## 2024-09-26 DIAGNOSIS — Z87891 Personal history of nicotine dependence: Secondary | ICD-10-CM | POA: Diagnosis not present

## 2024-09-26 DIAGNOSIS — N400 Enlarged prostate without lower urinary tract symptoms: Secondary | ICD-10-CM | POA: Insufficient documentation

## 2024-09-26 DIAGNOSIS — I119 Hypertensive heart disease without heart failure: Secondary | ICD-10-CM | POA: Insufficient documentation

## 2024-09-26 DIAGNOSIS — C884 Extranodal marginal zone b-cell lymphoma of mucosa-associated lymphoid tissue (malt-lymphoma) not having achieved remission: Secondary | ICD-10-CM | POA: Insufficient documentation

## 2024-09-26 DIAGNOSIS — Z860100 Personal history of colon polyps, unspecified: Secondary | ICD-10-CM | POA: Insufficient documentation

## 2024-09-26 NOTE — Progress Notes (Signed)
 Patient presents today to discuss recent PET scan results.

## 2024-09-26 NOTE — Assessment & Plan Note (Addendum)
#   Right forearm nodular lesion-Status post punch Biopsy- late April 2017 [[place]]- CUTANEOUS MARGINAL ZONE B CELL LYMPHOMA. S/p RT May 2017.  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  December 2025-PET scan no evidence of any  visceral disease; no concerns for any transformation.  # I discussed options with the patient of radiation to the areas of lymphoma vs-versus surveillance.  Also discussed the possibility of rituximab infusion-if multiple other lesions are noted/or recurrence is more frequent.   #Incidental findings on Imaging  CT , 2025: atherosclerosis recommend follow PCP for further discussion-I reviewed/discussed/counseled the patient.    DISPOSITION:  # referral to Dr.Chrystal re: cutaneous lymphoma # follow up in 1 year- MD -cbc/cmp/ldh/MD- Dr.B  Cc; Dr.Hussian; [place].

## 2024-09-26 NOTE — Progress Notes (Signed)
 [facility] CONSULT NOTE  Patient Care Team: Ransom Other, MD as PCP - General (Internal Medicine) Ivin Kocher, MD as Consulting Physician (Dermatology) Rennie Cindy SAUNDERS, MD as Consulting Physician (Oncology)  CHIEF COMPLAINTS/PURPOSE OF CONSULTATION:   Oncology History Overview Note  # CUTANEOUS LYMPHOMA- [face & Right thigh s/p RT 2006; Dr.Gold; [place] ]; 2012-Right Fore Arm knot [s/p Bx- May 2015 nodular dense lymphocytic infiltrate s/o Immunocytoma]  # April 2017 CUTANEOUS MARGINAL ZONE B CELL LYMPHOMA. [Dr.Lupton; [place]] RIGHT FOREARM s/p punch Biopsy; s/p RT [facility] 2017. ]  # DEC 2025-      1. Skin, left proximal forearm near elbow : CONSISTENT WITH MINIMAL DERMAL INVOLVEMENT BY THE PATIENT'S KNOWN CUTANEOUS       MARGINAL ZONE LYMPHOMA,  Skin, left lateral distal thigh :  CONSISTENT WITH MINIMAL DERMAL INVOLVEMENT BY THE PATIENT'S KNOWN CUTANEOUS   MARGINAL ZONE LYMPHOMA, SEE NOTE   DIAGNOSIS: CUTANEOUS B cell lymphoma  STAGE:     ;GOALS: control  CURRENT/MOST RECENT THERAPY: surveillaince     Primary cutaneous marginal zone B-cell lymphoma (HCC)  06/01/2016 Initial Diagnosis   Primary cutaneous marginal zone B-cell lymphoma (HCC)    HISTORY OF PRESENTING ILLNESS:with his wife. Ambulating independently.   [name] 74 y.o. male with above history of cutaneous marginal low-grade lymphoma of right forearm at [facility] in April 2017, status post radiation in May 2017.  Discussed the use of AI scribe software for clinical note transcription with the patient, who gave verbal consent to proceed.  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  He has a 20-year history of indolent, low-grade cutaneous marginal zone B-cell lymphoma with recurrences approximately every 5-10  years. The most recent recurrence was treated with six weeks of localized radiation therapy to the face, resulting in atrophy at the treatment site. He has undergone biopsies of suspicious skin lesions, all negative for malignancy. He currently has no symptoms attributable to lymphoma and specifically denies B symptoms including fever, night sweats, and weight loss. He is not receiving systemic therapy.  Additional imaging findings include age-related vascular calcifications, mild cardiomegaly, emphysema, small kidney stones, hiatal hernia, mild prostatic enlargement, and spinal arthritis.  He expresses concern regarding the frequency of follow-up and the need for close monitoring, referencing dissatisfaction with prior recommendations for annual surveillance. He is aware of the indolent but recurrent nature of his disease and is interested in discussing management options, including surveillance, additional radiation therapy for localized recurrence, and rituximab infusion should multifocal disease develop.      Review of Systems  Constitutional:  Negative for chills, diaphoresis, fever, malaise/fatigue and weight loss.  HENT:  Negative for nosebleeds and sore throat.   Eyes:  Negative for double vision.  Respiratory:  Negative for cough, hemoptysis, sputum production, shortness of breath and wheezing.   Cardiovascular:  Negative for chest pain, palpitations, orthopnea and leg swelling.  Gastrointestinal:  Negative for abdominal pain, blood in stool, constipation, diarrhea, heartburn, melena, nausea and vomiting.  Genitourinary:  Negative for dysuria, frequency and urgency.  Musculoskeletal:  Positive for back pain. Negative for falls and joint pain.  Skin:  Negative for itching and rash.       Per hpi  Neurological:  Negative for dizziness, tingling, focal weakness, weakness and headaches.  Endo/Heme/Allergies:  Does not bruise/bleed easily.  Psychiatric/Behavioral:  Negative for  depression.  The patient is not nervous/anxious and does not have insomnia.     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   OSA on CPAP    Primary cutaneous lymphoma [facility]) oncologist-  dr cindy joe (cancer center [place])--- per lov note 10--04-2017 -- no recurrence clinically   dx 2006 -- face and right thigh cutaneous lymphoma s/p radiation therapy (done in [place] );  2012 right forearm knot s/p bx- may 2015 nodular lymphocytic infiltrate s/o immunocytoma;  04/ 2017  cutaneous marginal zone B Cell lymphoma , right forearm (punch bx [id number])  s/p  radiation therapy 06/ 2017   SSBE (short-segment Barrett's esophagus)     SURGICAL HISTORY: Past Surgical History:  Procedure Laterality Date   COLONOSCOPY WITH ESOPHAGOGASTRODUODENOSCOPY (EGD)  01/12/2014   in [place]    CYSTOSCOPY/URETEROSCOPY/HOLMIUM LASER/STENT PLACEMENT Bilateral 08/04/2017   Procedure: CYSTOSCOPY/ RETROGRADE/URETEROSCOPY/HOLMIUM LASER/STENT PLACEMENT;  Surgeon: Devere Lonni Righter, MD;  Location: [facility];  Service: Urology;  Laterality: Bilateral;   ESOPHAGOGASTRODUODENOSCOPY     EXTRACORPOREAL SHOCK WAVE LITHOTRIPSY  1990s   HYDROCELE EXCISION Left 2000  approx.   TONSILLECTOMY  child   TOTAL KNEE ARTHROPLASTY Right 04/01/2018   Procedure: RIGHT TOTAL KNEE ARTHROPLASTY;  Surgeon: Kay Kemps, MD;  Location: [facility];  Service: Orthopedics;  Laterality: Right;   TOTAL KNEE ARTHROPLASTY Left 11/15/2020   Procedure: TOTAL KNEE  ARTHROPLASTY;  Surgeon: Kay Kemps, MD;  Location: [facility];  Service: Orthopedics;  Laterality: Left;    SOCIAL HISTORY: moved from [place] .  Social History   Socioeconomic History   Marital status: Married    Spouse name: Not on file   Number of children: Not on file   Years of education: Not on file   Highest education level: Not on file  Occupational History   Not on file  Tobacco Use   Smoking status: Former    Current packs/day: 0.00    Average packs/day: 2.0 packs/day for 22.0 years (44.0 ttl pk-yrs)    Types: Cigarettes    Start date: 08/23/1967    Quit date: 08/22/1989    Years since quitting: 35.1   Smokeless tobacco: Never  Vaping Use   Vaping status: Never Used  Substance and Sexual Activity   Alcohol  use: Yes    Alcohol /week: 8.0 standard drinks of alcohol     Types: 7 Glasses of wine, 1 Shots of liquor per week    Comment: wine every night   Drug use: No   Sexual activity: Not Currently  Other Topics Concern   Not on file  Social History Narrative   Not on file   Social Drivers of Health   Tobacco Use: Medium Risk (08/30/2024)   Patient History    Smoking Tobacco Use: Former    Smokeless Tobacco Use: Never    Passive Exposure: Not on Actuary Strain: Not on file  Food Insecurity:  Not on file  Transportation Needs: Not on file  Physical Activity: Not on file  Stress: Not on file  Social Connections: Not on file  Intimate Partner Violence: Not on file  Depression (PHQ2-9): Low Risk (09/26/2024)   Depression (PHQ2-9)    PHQ-2 Score: 0  Alcohol  Screen: Not on file  Housing: Not on file  Utilities: Not on file  Health Literacy: Not on file    FAMILY HISTORY: Family History  Problem Relation Age of Onset   Diabetes Father    Heart murmur Mother    Kidney disease Mother    Aneurysm Mother    Diabetes Brother     ALLERGIES:  has no known allergies.  MEDICATIONS:  Current Outpatient Medications  Medication Sig Dispense Refill    Fish Oil-Cholecalciferol  (OMEGA-3 + VITAMIN D3 PO) Take 2 capsules by mouth 2 (two) times daily.     hydrochlorothiazide  (HYDRODIURIL ) 12.5 MG tablet Take 12.5 mg by mouth daily.     losartan  (COZAAR ) 100 MG tablet Take 100 mg by mouth daily.     meloxicam  (MOBIC ) 15 MG tablet Take 1 tablet (15 mg total) by mouth daily. 30 tablet 0   naproxen  sodium (ANAPROX ) 220 MG tablet Take 440 mg by mouth daily as needed (pain).     omeprazole (PRILOSEC) 20 MG capsule Take 40 mg by mouth every evening. (Patient taking differently: Take 20 mg by mouth 2 (two) times daily before a meal.)     Polyethyl Glycol-Propyl Glycol (SYSTANE OP) Place 1 drop into both eyes 3 (three) times daily.     rosuvastatin  (CRESTOR ) 5 MG tablet Take 5 mg by mouth daily.  2   Liniments (BLUE-EMU SUPER STRENGTH) CREA Apply 1 application  topically daily as needed (pain).     Current Facility-Administered Medications  Medication Dose Route Frequency Provider Last Rate Last Admin   dexamethasone  (DECADRON ) injection 4 mg  4 mg Intra-articular Once        triamcinolone  acetonide (KENALOG ) 10 MG/ML injection 2.5 mg  2.5 mg Intra-articular Once         PHYSICAL EXAMINATION: ECOG PERFORMANCE STATUS: 0 - Asymptomatic  Vitals:   09/26/24 0922  BP: 107/63  Pulse: (!) 53  Temp: 97.7 F (36.5 C)  SpO2: 99%   Filed Weights   09/26/24 0922  Weight: 263 lb (119.3 kg)   Physical Exam Vitals reviewed.  Constitutional:      Appearance: He is not ill-appearing.  HENT:     Head: Normocephalic and atraumatic.  Cardiovascular:     Rate and Rhythm: Normal rate and regular rhythm.  Pulmonary:     Effort: No respiratory distress.     Breath sounds: No wheezing.  Abdominal:     General: There is no distension.     Palpations: Abdomen is soft.     Tenderness: There is no abdominal tenderness. There is no guarding.  Musculoskeletal:        General: No tenderness.  Lymphadenopathy:     Cervical: No cervical adenopathy.  Skin:     General: Skin is warm.     Findings: Lesion present.  Neurological:     Mental Status: He is alert and oriented to person, place, and time.  Psychiatric:        Mood and Affect: Mood and affect normal.        Behavior: Behavior normal.    LABORATORY DATA:  I have reviewed the data as listed Lab Results  Component Value Date   WBC  4.6 08/30/2024   HGB 15.2 08/30/2024   HCT 45.5 08/30/2024   MCV 84.9 08/30/2024   PLT 223 08/30/2024      Latest Ref Rng & Units 08/30/2024   10:27 AM 07/12/2023    2:44 PM 07/07/2022    9:57 AM  CMP  Glucose 70 - 99 mg/dL 882  893  872   BUN 8 - 23 mg/dL 16  13  19    Creatinine 0.61 - 1.24 mg/dL 9.17  9.33  9.08   Sodium 135 - 145 mmol/L 139  137  138   Potassium 3.5 - 5.1 mmol/L 4.5  4.1  4.6   Chloride 98 - 111 mmol/L 102  101  103   CO2 22 - 32 mmol/L 27  28  28    Calcium  8.9 - 10.3 mg/dL 9.7  8.9  9.1   Total Protein 6.5 - 8.1 g/dL 6.7  7.0  7.0   Total Bilirubin 0.0 - 1.2 mg/dL 1.0  1.1  1.2   Alkaline Phos 38 - 126 U/L 117  110  100   AST 15 - 41 U/L 30  30  28    ALT 0 - 44 U/L 34  34  35       ASSESSMENT & PLAN:    Primary cutaneous marginal zone B-cell lymphoma (HCC) # Right forearm nodular lesion-Status post punch Biopsy- late April 2017 [[place]]- CUTANEOUS MARGINAL ZONE B CELL LYMPHOMA. S/p RT May 2017.  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  December 2025-PET scan no evidence of any  visceral disease; no concerns for any transformation.  # I discussed options with the patient of radiation to the areas of lymphoma vs-versus surveillance.  Also discussed the possibility of rituximab infusion-if multiple other lesions are noted/or recurrence is more frequent.   #Incidental findings on Imaging  CT , 2025: atherosclerosis recommend follow PCP for further discussion-I reviewed/discussed/counseled the patient.    DISPOSITION:  #  referral to Dr.Chrystal re: cutaneous lymphoma # follow up in 1 year- MD -cbc/cmp/ldh/MD- Dr.B  Cc; Dr.Hussian; [place]/PCP-[facility].   Cindy JONELLE Joe, MD 09/26/2024

## 2024-09-27 ENCOUNTER — Ambulatory Visit: Admitting: Podiatry

## 2024-10-05 ENCOUNTER — Ambulatory Visit
Admission: RE | Admit: 2024-10-05 | Discharge: 2024-10-05 | Disposition: A | Discharge status: Home / Self Care | Payer: PRIVATE HEALTH INSURANCE | Source: Ambulatory Visit | Attending: Radiation Oncology | Admitting: Radiation Oncology

## 2024-10-05 ENCOUNTER — Encounter: Payer: Self-pay | Admitting: Radiation Oncology

## 2024-10-05 VITALS — BP 124/84 | HR 51 | Temp 98.0°F | Resp 16 | Wt 265.6 lb

## 2024-10-05 DIAGNOSIS — K219 Gastro-esophageal reflux disease without esophagitis: Secondary | ICD-10-CM | POA: Insufficient documentation

## 2024-10-05 DIAGNOSIS — Z87891 Personal history of nicotine dependence: Secondary | ICD-10-CM | POA: Insufficient documentation

## 2024-10-05 DIAGNOSIS — Z79899 Other long term (current) drug therapy: Secondary | ICD-10-CM | POA: Insufficient documentation

## 2024-10-05 DIAGNOSIS — E785 Hyperlipidemia, unspecified: Secondary | ICD-10-CM | POA: Insufficient documentation

## 2024-10-05 DIAGNOSIS — I10 Essential (primary) hypertension: Secondary | ICD-10-CM | POA: Diagnosis not present

## 2024-10-05 DIAGNOSIS — Z791 Long term (current) use of non-steroidal anti-inflammatories (NSAID): Secondary | ICD-10-CM | POA: Insufficient documentation

## 2024-10-05 DIAGNOSIS — C884 Extranodal marginal zone b-cell lymphoma of mucosa-associated lymphoid tissue (malt-lymphoma) not having achieved remission: Secondary | ICD-10-CM | POA: Insufficient documentation

## 2024-10-05 DIAGNOSIS — Z87442 Personal history of urinary calculi: Secondary | ICD-10-CM | POA: Diagnosis not present

## 2024-10-05 NOTE — Consult Note (Signed)
 " NEW PATIENT EVALUATION  Name: Michael Huffman  MRN: [id number]  Date:   10/05/2024     DOB: 1951-02-07   This 74 y.o. male patient presents to the clinic for initial evaluation of marginal zone B lymphoma of his left elbow and left thigh and patient previously treated back in 2017 to his right upper extremity for again marginal zone lymphoma.  REFERRING PHYSICIAN: Rennie Cindy SAUNDERS, MD  CHIEF COMPLAINT:  Chief Complaint  Patient presents with   B Cell Lymphoma    DIAGNOSIS: The encounter diagnosis was Primary cutaneous marginal zone B-cell lymphoma (HCC).   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  Both of these tumors were cutaneous marginal zone B-cell lymphoma's.  He recent had a recurrence on his face was treated with 6 weeks of localized radiation therapy to his face resulting in atrophy of the treatment site.  He has had a new lesion on his left elbow as well as left thigh both consistent with minimal dermal involvement of patient's known continues marginal zone lymphoma.  The left thigh also showed marginal zone lymphoma.  He is seen today for consultation and treatment.  He is asymptomatic specifically denies fever chills or night sweats.  He has had a recent PET scan which I reviewed showed no evidence of metabolically active lymphoma does have tiny renal stones as well as a hiatal hernia.  PLANNED TREATMENT REGIMEN: Hypofractionated electron-beam therapy  PAST MEDICAL HISTORY:  has a past medical history of Chronically dry eyes, bilateral, GERD (gastroesophageal reflux disease), Hiatal hernia, History of colon polyps (01/12/2013), History of external beam radiation therapy, History of kidney stones, History of radiation therapy,  Hyperlipidemia, Hypertension, Macular degeneration, OSA on CPAP, Primary cutaneous lymphoma [facility]) (oncologist-  dr cindy rennie (cancer center [place])--- per lov note 10--04-2017 -- no recurrence clinically), and SSBE (short-segment Barrett's esophagus).    PAST SURGICAL HISTORY:  Past Surgical History:  Procedure Laterality Date   COLONOSCOPY WITH ESOPHAGOGASTRODUODENOSCOPY (EGD)  01/12/2014   in [place]    CYSTOSCOPY/URETEROSCOPY/HOLMIUM LASER/STENT PLACEMENT Bilateral 08/04/2017   Procedure: CYSTOSCOPY/ RETROGRADE/URETEROSCOPY/HOLMIUM LASER/STENT PLACEMENT;  Surgeon: Devere Lonni Righter, MD;  [facility];  Service: Urology;  Laterality: Bilateral;   ESOPHAGOGASTRODUODENOSCOPY     EXTRACORPOREAL SHOCK WAVE LITHOTRIPSY  1990s   HYDROCELE EXCISION Left 2000  approx.   TONSILLECTOMY  child   TOTAL KNEE ARTHROPLASTY Right 04/01/2018   Procedure: RIGHT TOTAL KNEE ARTHROPLASTY;  Surgeon: Kay Kemps, MD;  Location: [facility];  Service: Orthopedics;  Laterality: Right;   TOTAL KNEE ARTHROPLASTY Left 11/15/2020   Procedure: TOTAL KNEE ARTHROPLASTY;  Surgeon: Kay Kemps, MD;  Location: [facility];  Service: Orthopedics;  Laterality: Left;    FAMILY HISTORY: family history includes Aneurysm in his mother; Diabetes in his brother and father; Heart murmur in his mother; Kidney disease in his mother.  SOCIAL HISTORY:  reports that he quit smoking about 35 years ago. His smoking use included cigarettes. He started smoking about 57 years ago. He has a 44 pack-year smoking history. He has never used smokeless tobacco. He reports current alcohol  use of about 8.0 standard drinks of alcohol  per week. He reports that he does not use drugs.  ALLERGIES: Patient has no known allergies.  MEDICATIONS:  Current Outpatient  Medications  Medication Sig Dispense Refill   Fish Oil-Cholecalciferol  (OMEGA-3 + VITAMIN D3 PO) Take 2 capsules by mouth 2 (two) times daily.      fluticasone (FLONASE) 50 MCG/ACT nasal spray Place 2 sprays into both nostrils daily.     hydrochlorothiazide  (HYDRODIURIL ) 12.5 MG tablet Take 12.5 mg by mouth daily.     Liniments (BLUE-EMU SUPER STRENGTH) CREA Apply 1 application  topically daily as needed (pain).     losartan  (COZAAR ) 100 MG tablet Take 100 mg by mouth daily.     meloxicam  (MOBIC ) 15 MG tablet Take 1 tablet (15 mg total) by mouth daily. 30 tablet 0   naproxen  sodium (ANAPROX ) 220 MG tablet Take 440 mg by mouth daily as needed (pain).     omeprazole (PRILOSEC) 20 MG capsule Take 40 mg by mouth every evening. (Patient taking differently: Take 20 mg by mouth 2 (two) times daily before a meal.)     Polyethyl Glycol-Propyl Glycol (SYSTANE OP) Place 1 drop into both eyes 3 (three) times daily.     rosuvastatin  (CRESTOR ) 5 MG tablet Take 5 mg by mouth daily.  2   Current Facility-Administered Medications  Medication Dose Route Frequency Provider Last Rate Last Admin   dexamethasone  (DECADRON ) injection 4 mg  4 mg Intra-articular Once        triamcinolone  acetonide (KENALOG ) 10 MG/ML injection 2.5 mg  2.5 mg Intra-articular Once         ECOG PERFORMANCE STATUS:  0 - Asymptomatic  REVIEW OF SYSTEMS: Patient denies any weight loss, fatigue, weakness, fever, chills or night sweats. Patient denies any loss of vision, blurred vision. Patient denies any ringing  of the ears or hearing loss. No irregular heartbeat. Patient denies heart murmur or history of fainting. Patient denies any chest pain or pain radiating to her upper extremities. Patient denies any shortness of breath, difficulty breathing at night, cough or hemoptysis. Patient denies any swelling in the lower legs. Patient denies any nausea vomiting, vomiting of blood, or coffee ground material in the vomitus. Patient denies any stomach pain. Patient states has had normal bowel movements no significant constipation or diarrhea. Patient denies any dysuria, hematuria or significant  nocturia. Patient denies any problems walking, swelling in the joints or loss of balance. Patient denies any skin changes, loss of hair or loss of weight. Patient denies any excessive worrying or anxiety or significant depression. Patient denies any problems with insomnia. Patient denies excessive thirst, polyuria, polydipsia. Patient denies any swollen glands, patient denies easy bruising or easy bleeding. Patient denies any recent infections, allergies or URI. Patient s visual fields have not changed significantly in recent time.   PHYSICAL EXAM: BP 124/84   Pulse (!) 51   Temp 98 F (36.7 C) (Tympanic)   Resp 16   Wt 265 lb 9.6 oz (120.5 kg)   BMI 34.10 kg/m  Slightly nodular lesions on both his left elbow as well as his left thigh consistent with biopsy-proven lymphoma.  No evidence of peripheral adenopathy is noted.  Well-developed well-nourished patient in NAD. HEENT reveals PERLA, EOMI, discs not visualized.  Oral cavity is clear. No oral mucosal lesions are identified. Neck is clear without evidence of cervical or supraclavicular adenopathy. Lungs are clear to A&P. Cardiac examination is essentially unremarkable with regular rate and rhythm without murmur rub or thrill. Abdomen is benign with no organomegaly or masses noted. Motor sensory and DTR levels are equal and symmetric in the upper and lower extremities. Cranial nerves II  through XII are grossly intact. Proprioception is intact. No peripheral adenopathy or edema is identified. No motor or sensory levels are noted. Crude visual fields are within normal range.  LABORATORY DATA: Pathology reports reviewed    RADIOLOGY RESULTS: PET CT scan reviewed compatible with above-stated findings   IMPRESSION: Marginal zone cutaneous B-cell lymphoma of both the left elbow as well as left thigh and patient treated multiple times previously with electron-beam therapy and 74 year old male  PLAN: At this time the latest literature shows that a 2  fraction dose of 800 cGy is equal to long-term electron-beam therapy.  I would like to sequentially treat both lesions to 8 Gray in 2 fractions.  Risks and benefits of treatment including possible skin reaction slight fatigue were reviewed with the patient.  He comprehends my treatment plan well.  I have personally set up and ordered CT simulation for about a week and a half down the road.  Patient comprehends my plan well.  I would like to take this opportunity to thank you for allowing me to participate in the care of your patient.SABRA Marcey Penton, MD         "

## 2024-10-17 ENCOUNTER — Ambulatory Visit
Admission: RE | Admit: 2024-10-17 | Discharge: 2024-10-17 | Disposition: A | Discharge status: Home / Self Care | Payer: PRIVATE HEALTH INSURANCE | Source: Ambulatory Visit | Attending: Radiation Oncology | Admitting: Radiation Oncology

## 2024-10-17 DIAGNOSIS — C884 Extranodal marginal zone b-cell lymphoma of mucosa-associated lymphoid tissue (malt-lymphoma) not having achieved remission: Secondary | ICD-10-CM | POA: Diagnosis present

## 2024-10-19 DIAGNOSIS — C884 Extranodal marginal zone b-cell lymphoma of mucosa-associated lymphoid tissue (malt-lymphoma) not having achieved remission: Secondary | ICD-10-CM | POA: Diagnosis not present

## 2024-10-24 ENCOUNTER — Ambulatory Visit
Admission: RE | Admit: 2024-10-24 | Discharge: 2024-10-24 | Disposition: A | Payer: PRIVATE HEALTH INSURANCE | Source: Ambulatory Visit | Attending: Radiation Oncology | Admitting: Radiation Oncology

## 2024-10-24 ENCOUNTER — Other Ambulatory Visit: Payer: Self-pay

## 2024-10-24 LAB — RAD ONC ARIA SESSION SUMMARY
Course Elapsed Days: 0
Plan Fractions Treated to Date: 1
Plan Prescribed Dose Per Fraction: 4 Gy
Plan Total Fractions Prescribed: 2
Plan Total Prescribed Dose: 8 Gy
Reference Point Dosage Given to Date: 4 Gy
Reference Point Session Dosage Given: 4 Gy
Session Number: 1

## 2024-10-25 ENCOUNTER — Other Ambulatory Visit: Payer: Self-pay

## 2024-10-25 ENCOUNTER — Ambulatory Visit
Admission: RE | Admit: 2024-10-25 | Discharge: 2024-10-25 | Disposition: A | Payer: PRIVATE HEALTH INSURANCE | Source: Ambulatory Visit | Attending: Radiation Oncology | Admitting: Radiation Oncology

## 2024-10-25 LAB — RAD ONC ARIA SESSION SUMMARY
Course Elapsed Days: 1
Plan Fractions Treated to Date: 2
Plan Prescribed Dose Per Fraction: 4 Gy
Plan Total Fractions Prescribed: 2
Plan Total Prescribed Dose: 8 Gy
Reference Point Dosage Given to Date: 8 Gy
Reference Point Session Dosage Given: 4 Gy
Session Number: 2

## 2024-11-01 ENCOUNTER — Ambulatory Visit: Payer: PRIVATE HEALTH INSURANCE

## 2024-11-02 ENCOUNTER — Ambulatory Visit: Payer: PRIVATE HEALTH INSURANCE

## 2025-01-25 ENCOUNTER — Encounter: Payer: PRIVATE HEALTH INSURANCE | Admitting: Dermatology

## 2025-08-30 ENCOUNTER — Inpatient Hospital Stay: Payer: PRIVATE HEALTH INSURANCE

## 2025-08-30 ENCOUNTER — Inpatient Hospital Stay: Payer: PRIVATE HEALTH INSURANCE | Admitting: Internal Medicine
